# Patient Record
Sex: Male | Born: 1940
Health system: Southern US, Community
[De-identification: ages and names within clinical notes are randomized; demographics above are authoritative.]

## PROBLEM LIST (undated history)

## (undated) DIAGNOSIS — M48061 Spinal stenosis, lumbar region without neurogenic claudication: Secondary | ICD-10-CM

## (undated) DIAGNOSIS — C801 Malignant (primary) neoplasm, unspecified: Secondary | ICD-10-CM

## (undated) DIAGNOSIS — I10 Essential (primary) hypertension: Secondary | ICD-10-CM

## (undated) DIAGNOSIS — IMO0002 Reserved for concepts with insufficient information to code with codable children: Secondary | ICD-10-CM

## (undated) DIAGNOSIS — E7800 Pure hypercholesterolemia, unspecified: Secondary | ICD-10-CM

## (undated) DIAGNOSIS — D696 Thrombocytopenia, unspecified: Secondary | ICD-10-CM

## (undated) DIAGNOSIS — M1990 Unspecified osteoarthritis, unspecified site: Secondary | ICD-10-CM

## (undated) DIAGNOSIS — D689 Coagulation defect, unspecified: Secondary | ICD-10-CM

## (undated) DIAGNOSIS — H269 Unspecified cataract: Secondary | ICD-10-CM

## (undated) DIAGNOSIS — J00 Acute nasopharyngitis [common cold]: Secondary | ICD-10-CM

## (undated) DIAGNOSIS — J309 Allergic rhinitis, unspecified: Secondary | ICD-10-CM

## (undated) DIAGNOSIS — N400 Enlarged prostate without lower urinary tract symptoms: Secondary | ICD-10-CM

## (undated) DIAGNOSIS — I959 Hypotension, unspecified: Secondary | ICD-10-CM

## (undated) DIAGNOSIS — T7840XA Allergy, unspecified, initial encounter: Secondary | ICD-10-CM

## (undated) DIAGNOSIS — I2510 Atherosclerotic heart disease of native coronary artery without angina pectoris: Secondary | ICD-10-CM

## (undated) HISTORY — DX: Unspecified cataract: H26.9

## (undated) HISTORY — DX: Allergy, unspecified, initial encounter: T78.40XA

## (undated) HISTORY — DX: Malignant (primary) neoplasm, unspecified: C80.1

## (undated) HISTORY — DX: Acute nasopharyngitis (common cold): J00

## (undated) HISTORY — PX: COLONOSCOPY: SHX174

## (undated) HISTORY — DX: Unspecified osteoarthritis, unspecified site: M19.90

## (undated) HISTORY — DX: Thrombocytopenia, unspecified: D69.6

## (undated) HISTORY — DX: Essential (primary) hypertension: I10

## (undated) HISTORY — DX: Atherosclerotic heart disease of native coronary artery without angina pectoris: I25.10

## (undated) HISTORY — DX: Benign prostatic hyperplasia without lower urinary tract symptoms: N40.0

## (undated) HISTORY — PX: CHOLECYSTECTOMY: SHX55

## (undated) HISTORY — PX: EYE SURGERY: SHX253

## (undated) HISTORY — DX: Allergic rhinitis, unspecified: J30.9

## (undated) HISTORY — DX: Coagulation defect, unspecified: D68.9

## (undated) HISTORY — PX: OTHER SURGICAL HISTORY: SHX169

## (undated) HISTORY — DX: Spinal stenosis, lumbar region without neurogenic claudication: M48.061

## (undated) HISTORY — DX: Reserved for concepts with insufficient information to code with codable children: IMO0002

## (undated) HISTORY — DX: Hypotension, unspecified: I95.9

## (undated) HISTORY — DX: Pure hypercholesterolemia, unspecified: E78.00

---
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
## 1963-09-27 HISTORY — PX: HERNIA REPAIR: SHX51

## 2010-04-19 ENCOUNTER — Encounter: Admission: RE | Admit: 2010-04-19 | Discharge: 2010-04-19 | Payer: Self-pay | Admitting: Neurology

## 2010-06-02 ENCOUNTER — Encounter: Admission: RE | Admit: 2010-06-02 | Discharge: 2010-06-02 | Payer: Self-pay | Admitting: Neurology

## 2010-12-01 ENCOUNTER — Encounter: Payer: Self-pay | Admitting: Cardiovascular Disease

## 2010-12-13 ENCOUNTER — Encounter: Payer: Self-pay | Admitting: Cardiovascular Disease

## 2010-12-13 ENCOUNTER — Other Ambulatory Visit: Payer: Self-pay | Admitting: Neurology

## 2010-12-13 DIAGNOSIS — M4800 Spinal stenosis, site unspecified: Secondary | ICD-10-CM

## 2010-12-15 ENCOUNTER — Ambulatory Visit
Admission: RE | Admit: 2010-12-15 | Discharge: 2010-12-15 | Disposition: A | Discharge status: Home / Self Care | Payer: Medicare Other | Source: Ambulatory Visit | Attending: Neurology | Admitting: Neurology

## 2010-12-15 DIAGNOSIS — M4800 Spinal stenosis, site unspecified: Secondary | ICD-10-CM

## 2011-04-15 ENCOUNTER — Encounter: Payer: Self-pay | Admitting: Cardiovascular Disease

## 2011-05-05 ENCOUNTER — Encounter: Payer: Self-pay | Admitting: Cardiovascular Disease

## 2011-05-05 ENCOUNTER — Ambulatory Visit (INDEPENDENT_AMBULATORY_CARE_PROVIDER_SITE_OTHER): Payer: Medicare Other | Admitting: Cardiovascular Disease

## 2011-05-05 DIAGNOSIS — R079 Chest pain, unspecified: Secondary | ICD-10-CM

## 2011-05-05 DIAGNOSIS — R9431 Abnormal electrocardiogram [ECG] [EKG]: Secondary | ICD-10-CM

## 2011-05-05 NOTE — Patient Instructions (Addendum)
Your physician recommends that you schedule a follow-up appointment as needed with Dr. Excell Seltzer.   Your physician has requested that you have en exercise stress myoview. For further information please visit [email or web address] Please follow instruction sheet, as given.

## 2011-05-07 ENCOUNTER — Encounter: Payer: Self-pay | Admitting: Cardiovascular Disease

## 2011-05-07 DIAGNOSIS — R9431 Abnormal electrocardiogram [ECG] [EKG]: Secondary | ICD-10-CM | POA: Insufficient documentation

## 2011-05-07 NOTE — Assessment & Plan Note (Signed)
This is a 70 year old gentleman with an abnormal EKG, changes worrisome for ischemia. With his EKG findings, especially considering his plans for upcoming lumbar back surgery, I think we should proceed with a Lexiscan Myoview stress test. This will help evaluate the patient's EKG abnormality and also will risk stratify him for surgery. He does not have a particularly strong risk profile for cardiac disease although he clearly is at risk at age 19. I have reviewed his cholesterol panel demonstrated an HDL of 45 LDL 128 and triglycerides of 86 I think if his stress test is normal and considering his paucity of other risk factors, it would be reasonable to continue with lifestyle modification. Will followup with the patient after his stress test results are complete, and if there is no ischemia I would be happy to see him back on an as needed basis.

## 2011-05-07 NOTE — Progress Notes (Signed)
HPI:  This is a 70 year old gentleman referred for evaluation of an abnormal EKG. The patient developed neck and ear pain in the setting of a fever. Because of his neck pain, an EKG was done in the emergency department. This demonstrated new changes compared to his baseline EKG. He was noted to have inferolateral ST segment abnormality worrisome for ischemia. The patient had no symptoms of chest pain or pressure. He has no past history of cardiac disease. He was referred here for further evaluation. The patient has upcoming back surgery scheduled.  He specifically denies chest pain or pressure. He denies dyspnea, edema, orthopnea, palpitations, or PND. He does have shortness of breath with exertion and he has previously felt this was age related.  Outpatient Encounter Prescriptions as of 05/05/2011  Medication Sig Dispense Refill  . aspirin 325 MG tablet Take 325 mg by mouth daily.        . calcium carbonate (OS-CAL) 600 MG TABS Take 600 mg by mouth daily.        Marland Kitchen doxazosin (CARDURA) 2 MG tablet Take 2 mg by mouth at bedtime.        . gabapentin (NEURONTIN) 300 MG capsule Take 300 mg by mouth daily.        Marland Kitchen gabapentin (NEURONTIN) 600 MG tablet Take 600 mg by mouth daily.        . Multiple Vitamin (MULTIVITAMIN) capsule Take 1 capsule by mouth daily.        . tadalafil (CIALIS) 20 MG tablet Take 20 mg by mouth daily as needed.        . vitamin B-12 (CYANOCOBALAMIN) 1000 MCG tablet Take 1,000 mcg by mouth daily.        Marland Kitchen VITAMIN D, CHOLECALCIFEROL, PO Take by mouth daily.          Review of patient's allergies indicates no known allergies.  No past medical history on file.  Past Surgical History  Procedure Date  . Hernia repair 1965  . Appendectomy 1958    History   Social History  . Marital Status: Married    Spouse Name: N/A    Number of Children: N/A  . Years of Education: N/A   Occupational History  . Not on file.   Social History Main Topics  . Smoking status: Former Games developer    . Smokeless tobacco: Not on file  . Alcohol Use: Not on file  . Drug Use: Not on file  . Sexually Active: Not on file   Other Topics Concern  . Not on file   Social History Narrative   The patient is widowed. He is retired from Airline pilot. He quit smoking in 1995. He has no history of alcohol use. He has previously done regular exercise until developing a back problem.    Family History  Problem Relation Age of Onset  . Stroke Mother 1  . Prostate cancer Father 57  . Heart failure Brother 65    ROS: General: no fevers/chills/night sweats Eyes: no blurry vision, diplopia, or amaurosis ENT: no sore throat or hearing loss Resp: no cough, wheezing, or hemoptysis CV: no edema or palpitations GI: no abdominal pain, nausea, vomiting, diarrhea, or constipation GU: no dysuria, frequency, or hematuria Skin: no rash Neuro: no headache, numbness, tingling, or weakness of extremities Musculoskeletal: no joint pain or swelling Heme: no bleeding, DVT, or easy bruising Endo: no polydipsia or polyuria  BP 140/78  Pulse 60  Resp 16  Ht 6' (1.829 m)  Wt 200 lb (90.719  kg)  BMI 27.12 kg/m2  PHYSICAL EXAM: Pt is alert and oriented, WD, WN, in no distress. HEENT: normal Neck: JVP normal. Carotid upstrokes normal without bruits. No thyromegaly. Lungs: equal expansion, clear bilaterally CV: Apex is discrete and nondisplaced, RRR without murmur or gallop Abd: soft, NT, +BS, no bruit, no hepatosplenomegaly Back: no CVA tenderness Ext: no C/C/E        Femoral pulses 2+= without bruits        DP/PT pulses intact and = Skin: warm and dry without rash Neuro: CNII-XII intact             Strength intact = bilaterally  EKG:  Normal sinus rhythm with nonspecific IVCD. ST/T abnormalities consider inferolateral ischemia. EKG dated 7 22,012.  ASSESSMENT AND PLAN:

## 2011-05-17 ENCOUNTER — Ambulatory Visit (HOSPITAL_COMMUNITY): Payer: Medicare Other | Attending: Cardiovascular Disease | Admitting: Radiology

## 2011-05-17 DIAGNOSIS — R0609 Other forms of dyspnea: Secondary | ICD-10-CM

## 2011-05-17 DIAGNOSIS — R079 Chest pain, unspecified: Secondary | ICD-10-CM | POA: Insufficient documentation

## 2011-05-17 DIAGNOSIS — R9431 Abnormal electrocardiogram [ECG] [EKG]: Secondary | ICD-10-CM | POA: Insufficient documentation

## 2011-05-17 MED ORDER — TECHNETIUM TC 99M TETROFOSMIN IV KIT
11.0000 | PACK | Freq: Once | INTRAVENOUS | Status: AC | PRN
  Administered 2011-05-17: 11 via INTRAVENOUS

## 2011-05-17 MED ORDER — TECHNETIUM TC 99M TETROFOSMIN IV KIT
33.0000 | PACK | Freq: Once | INTRAVENOUS | Status: AC | PRN
  Administered 2011-05-17: 33 via INTRAVENOUS

## 2011-05-17 NOTE — Progress Notes (Signed)
[facility] SITE 3 NUCLEAR MED [address] [place] [address] [phone]  Cardiology Nuclear Med [place]  Larry Harris is a 70 y.o. male [id number] [birth year]   Nuclear Med Background Indication for Stress Test:  Evaluation for Ischemia; Abnormal EKG and Pending Back Surgery by Dr. Raynald Kemp @ [facility] History:  '97 UYQ:[id number]>DGLO:VF per patient Cardiac Risk Factors: History of Smoking  Symptoms:  Neck Pain with 102* fever (last episode of neck discomfort was in July) and DOE   Nuclear Pre-Procedure Caffeine/Decaff Intake:  None NPO After: 7:30pm   Lungs:  Clear. IV 0.9% NS with Angio Cath:  20g  IV Site: R Antecubital  IV Started by:  Stanton Kidney, EMT-P  Chest Size (in):  42 Cup Size: n/a  Height: 6' (1.829 m)  Weight:  196 lb (88.905 kg)  BMI:  Body mass index is 26.58 kg/(m^2). Tech Comments: A.M. Medications taken.    Nuclear Med Study 1 or 2 day study: 1 day  Stress Test Type:  Stress  Reading MD: Willa Rough, MD  Order Authorizing Provider:  Tonny Bollman, MD  Resting Radionuclide: Technetium 28m Tetrofosmin  Resting Radionuclide Dose: 11.0 mCi   Stress Radionuclide:  Technetium 72m Tetrofosmin  Stress Radionuclide Dose: 33.0 mCi           Stress Protocol Rest HR: 63 Stress HR: 140  Rest BP: 125/77 Stress BP: 192/65  Exercise Time (min): 8:01 METS: 10.1   Predicted Max HR: 149 bpm % Max HR: 93.96 bpm Rate Pressure Product: 64332   Dose of Adenosine (mg):  n/a Dose of Lexiscan: n/a mg  Dose of Atropine (mg): n/a Dose of Dobutamine: n/a mcg/kg/min (at max HR)  Stress Test Technologist: Smiley Houseman, CMA-N  Nuclear Technologist:  Doyne Keel, CNMT     Rest Procedure:  Myocardial perfusion imaging was performed at rest 45 minutes following the intravenous administration of Technetium 50m Tetrofosmin.  Rest ECG: Nonspecific T-wave changes and occasional PVC's.  Stress Procedure:  The patient exercised for 8:01 on the treadmill  utilizing the Bruce protocol.  The patient stopped due to fatigue and denied any chest pain.  There were marked ST-T wave changes.  Technetium 63m Tetrofosmin was injected at peak exercise and myocardial perfusion imaging was performed after a brief delay.  Stress ECG: The resting ST segments are abnormal. With stress there is significant additional ST depression.  QPS Raw Data Images:  Patient motion noted; appropriate software correction applied. Stress Images:  Normal homogeneous uptake in all areas of the myocardium. Rest Images:  Normal homogeneous uptake in all areas of the myocardium. Subtraction (SDS):  No evidence of ischemia. Transient Ischemic Dilatation (Normal <1.22):  1.15 Lung/Heart Ratio (Normal <0.45):  0.24  Quantitative Gated Spect Images QGS EDV:  100 ml QGS ESV:  44 ml QGS cine images:  Normal Wall Motion QGS EF: 57%  Impression Exercise Capacity:  Good exercise capacity. BP Response:  Normal blood pressure response. Clinical Symptoms:  No chest pain. ECG Impression:  As noted above, there are resting ST changes that become worse with stress. Comparison with Prior Nuclear Study: No images to compare  Overall Impression:  The nuclear images are normal. There is no dilitation of the LV with stress. The EKG changes are concerning, but there are significant ST changes at rest.  The nuclear images are normal.  Willa Rough

## 2012-01-03 ENCOUNTER — Other Ambulatory Visit: Payer: Self-pay | Admitting: Neurology

## 2012-01-03 DIAGNOSIS — M48061 Spinal stenosis, lumbar region without neurogenic claudication: Secondary | ICD-10-CM

## 2012-01-03 DIAGNOSIS — M7918 Myalgia, other site: Secondary | ICD-10-CM

## 2012-01-09 ENCOUNTER — Ambulatory Visit
Admission: RE | Admit: 2012-01-09 | Discharge: 2012-01-09 | Disposition: A | Discharge status: Home / Self Care | Payer: Medicare Other | Source: Ambulatory Visit | Attending: Neurology | Admitting: Neurology

## 2012-01-09 VITALS — BP 156/69 | HR 64

## 2012-01-09 DIAGNOSIS — M48061 Spinal stenosis, lumbar region without neurogenic claudication: Secondary | ICD-10-CM

## 2012-01-09 DIAGNOSIS — M7918 Myalgia, other site: Secondary | ICD-10-CM

## 2012-01-09 MED ORDER — METHYLPREDNISOLONE ACETATE 40 MG/ML INJ SUSP (RADIOLOG
120.0000 mg | Freq: Once | INTRAMUSCULAR | Status: AC
  Administered 2012-01-09: 120 mg via EPIDURAL

## 2012-01-09 MED ORDER — IOHEXOL 180 MG/ML  SOLN
1.0000 mL | Freq: Once | INTRAMUSCULAR | Status: AC | PRN
  Administered 2012-01-09: 1 mL via EPIDURAL

## 2012-01-09 NOTE — Discharge Instructions (Signed)

## 2012-05-24 ENCOUNTER — Other Ambulatory Visit: Payer: Self-pay | Admitting: Neurology

## 2012-05-24 DIAGNOSIS — M5416 Radiculopathy, lumbar region: Secondary | ICD-10-CM

## 2012-05-24 DIAGNOSIS — M4807 Spinal stenosis, lumbosacral region: Secondary | ICD-10-CM

## 2012-05-30 ENCOUNTER — Ambulatory Visit
Admission: RE | Admit: 2012-05-30 | Discharge: 2012-05-30 | Disposition: A | Discharge status: Home / Self Care | Payer: Medicare Other | Source: Ambulatory Visit | Attending: Neurology | Admitting: Neurology

## 2012-05-30 DIAGNOSIS — M4807 Spinal stenosis, lumbosacral region: Secondary | ICD-10-CM

## 2012-05-30 DIAGNOSIS — M5416 Radiculopathy, lumbar region: Secondary | ICD-10-CM

## 2012-06-01 ENCOUNTER — Other Ambulatory Visit: Payer: Self-pay | Admitting: Neurology

## 2012-06-01 DIAGNOSIS — M5416 Radiculopathy, lumbar region: Secondary | ICD-10-CM

## 2012-06-01 DIAGNOSIS — M48061 Spinal stenosis, lumbar region without neurogenic claudication: Secondary | ICD-10-CM

## 2012-06-07 ENCOUNTER — Ambulatory Visit
Admission: RE | Admit: 2012-06-07 | Discharge: 2012-06-07 | Disposition: A | Discharge status: Home / Self Care | Payer: Medicare Other | Source: Ambulatory Visit | Attending: Neurology | Admitting: Neurology

## 2012-06-07 DIAGNOSIS — M5416 Radiculopathy, lumbar region: Secondary | ICD-10-CM

## 2012-06-07 DIAGNOSIS — M48061 Spinal stenosis, lumbar region without neurogenic claudication: Secondary | ICD-10-CM

## 2012-06-07 MED ORDER — IOHEXOL 180 MG/ML  SOLN
1.0000 mL | Freq: Once | INTRAMUSCULAR | Status: AC | PRN
  Administered 2012-06-07: 1 mL via EPIDURAL

## 2012-06-07 MED ORDER — METHYLPREDNISOLONE ACETATE 40 MG/ML INJ SUSP (RADIOLOG
120.0000 mg | Freq: Once | INTRAMUSCULAR | Status: AC
  Administered 2012-06-07: 120 mg via EPIDURAL

## 2012-11-28 ENCOUNTER — Other Ambulatory Visit: Payer: Self-pay | Admitting: Neurology

## 2012-11-28 DIAGNOSIS — M5416 Radiculopathy, lumbar region: Secondary | ICD-10-CM

## 2013-01-02 ENCOUNTER — Ambulatory Visit
Admission: RE | Admit: 2013-01-02 | Discharge: 2013-01-02 | Disposition: A | Discharge status: Home / Self Care | Payer: Medicare Other | Source: Ambulatory Visit | Attending: Neurology | Admitting: Neurology

## 2013-01-02 DIAGNOSIS — M5416 Radiculopathy, lumbar region: Secondary | ICD-10-CM

## 2013-01-02 MED ORDER — IOHEXOL 180 MG/ML  SOLN
1.0000 mL | Freq: Once | INTRAMUSCULAR | Status: AC | PRN
  Administered 2013-01-02: 1 mL via EPIDURAL

## 2013-01-02 MED ORDER — METHYLPREDNISOLONE ACETATE 40 MG/ML INJ SUSP (RADIOLOG
120.0000 mg | Freq: Once | INTRAMUSCULAR | Status: AC
  Administered 2013-01-02: 120 mg via EPIDURAL

## 2014-06-03 ENCOUNTER — Encounter: Payer: Self-pay | Admitting: Neurology

## 2014-06-04 ENCOUNTER — Ambulatory Visit (INDEPENDENT_AMBULATORY_CARE_PROVIDER_SITE_OTHER): Payer: Medicare Other | Admitting: Neurology

## 2014-06-04 ENCOUNTER — Encounter: Payer: Self-pay | Admitting: Neurology

## 2014-06-04 ENCOUNTER — Encounter (INDEPENDENT_AMBULATORY_CARE_PROVIDER_SITE_OTHER): Payer: Self-pay

## 2014-06-04 VITALS — BP 173/77 | HR 72 | Ht 71.0 in | Wt 200.0 lb

## 2014-06-04 DIAGNOSIS — M48061 Spinal stenosis, lumbar region without neurogenic claudication: Secondary | ICD-10-CM | POA: Insufficient documentation

## 2014-06-04 HISTORY — DX: Spinal stenosis, lumbar region without neurogenic claudication: M48.061

## 2014-06-04 NOTE — Progress Notes (Signed)
Reason for visit: Lumbosacral spinal stenosis  Larry Harris is a 73 y.o. male  History of present illness:  Larry Harris is a 73 year old right-handed white male with a history of lumbosacral spinal stenosis with severe spinal stenosis at the L4-5 level. The patient has been followed by Dr. Erling Cruz from this group since 2011. The last MRI study on the low back was done in 2013. In the past, the patient has not responded to physical therapy, but he has done well with epidural steroid injections. The injections have lasted many months, and the last injection he had was in April of 2014. The patient has done quite well since that time. The patient reports some back discomfort, and sciatica pain down the right leg to the foot with numbness in the right foot. Occasionally, he will have some pain down the left leg. The patient denies any neck pain or arm discomfort. He denies issues controlling the bowels or the bladder. He denies any balance problems. He finds it difficult to walk uphill. He is sent to this office for further evaluation.  Past Medical History  Diagnosis Date  . CAD (coronary artery disease)   . Acute rhinitis   . Hypotension   . Hypercholesteremia   . Osteoarthritis   . BPH (benign prostatic hyperplasia)   . Allergic rhinitis, cause unspecified   . Thrombocytopenia, unspecified   . Neuralgia, neuritis, and radiculitis, unspecified   . Spinal stenosis of lumbar region 06/04/2014    Past Surgical History  Procedure Laterality Date  . Hernia repair  1965  . Appendectomy  1958    Family History  Problem Relation Age of Onset  . Stroke Mother 70  . Prostate cancer Father 65  . Heart failure Brother 62    Social history:  reports that he has quit smoking. His smoking use included Cigarettes. He has a 25 pack-year smoking history. He has never used smokeless tobacco. He reports that he drinks alcohol. He reports that he does not use illicit drugs.  Medications:    Current Outpatient Prescriptions on File Prior to Visit  Medication Sig Dispense Refill  . aspirin 325 MG tablet Take 325 mg by mouth daily.        . calcium carbonate (OS-CAL) 600 MG TABS Take 600 mg by mouth daily.        Marland Kitchen doxazosin (CARDURA) 2 MG tablet Take 2 mg by mouth at bedtime.        . fexofenadine (ALLEGRA) 180 MG tablet Take 180 mg by mouth daily.      Marland Kitchen gabapentin (NEURONTIN) 300 MG capsule Take 300 mg by mouth daily.        Marland Kitchen gabapentin (NEURONTIN) 600 MG tablet Take 600 mg by mouth daily.        . Multiple Vitamin (MULTIVITAMIN) capsule Take 1 capsule by mouth daily.        . sildenafil (VIAGRA) 100 MG tablet Take 100 mg by mouth daily as needed for erectile dysfunction.      . vitamin B-12 (CYANOCOBALAMIN) 1000 MCG tablet Take 1,000 mcg by mouth daily.        Marland Kitchen VITAMIN D, CHOLECALCIFEROL, PO Take by mouth daily.         No current facility-administered medications on file prior to visit.     No Known Allergies  ROS:  Out of a complete 14 system review of symptoms, the patient complains only of the following symptoms, and all other reviewed systems are negative.  Snoring Urination problems Easy bruising Muscle cramps Numbness, weakness  Blood pressure 173/77, pulse 72, height 5\' 11"  (1.803 m), weight 200 lb (90.719 kg).  Physical Exam  General: The patient is alert and cooperative at the time of the examination.  Eyes: Pupils are equal, round, and reactive to light. Discs are flat bilaterally.  Neck: The neck is supple, no carotid bruits are noted.  Respiratory: The respiratory examination is clear.  Cardiovascular: The cardiovascular examination reveals a regular rate and rhythm, no obvious murmurs or rubs are noted.  Neuromuscular: Range of movement of the low back is full.  Skin: Extremities are without significant edema.  Neurologic Exam  Mental status: The patient is alert and oriented x 3 at the time of the examination. The patient has apparent  normal recent and remote memory, with an apparently normal attention span and concentration ability.  Cranial nerves: Facial symmetry is present. There is good sensation of the face to pinprick and soft touch bilaterally. The strength of the facial muscles and the muscles to head turning and shoulder shrug are normal bilaterally. Speech is well enunciated, no aphasia or dysarthria is noted. Extraocular movements are full. Visual fields are full. The tongue is midline, and the patient has symmetric elevation of the soft palate. No obvious hearing deficits are noted.  Motor: The motor testing reveals 5 over 5 strength of all 4 extremities. Good symmetric motor tone is noted throughout.  Sensory: Sensory testing is intact to pinprick, soft touch, vibration sensation, and position sense on all 4 extremities, with the exception that there is some decrease in pinprick sensation and vibration sensation on the right foot. No evidence of extinction is noted.  Coordination: Cerebellar testing reveals good finger-nose-finger and heel-to-shin bilaterally.  Gait and station: Gait is normal. Tandem gait is slightly unsteady. The patient is unable to walk on the toes bilaterally, is able to walk on heels bilaterally. Romberg is negative. No drift is seen.  Reflexes: Deep tendon reflexes are symmetric, but are depressed bilaterally. The ankle jerk reflexes are absent bilaterally. Toes are downgoing bilaterally.   MRI lumbar 05/29/12:  Impression: Abnormal MRI lumbar spine (without contrast) demonstrating: 1. At L4-5: Grade 1 anterior spondylolisthesis of L4 on L5 (15mm), disc bulging and facet hypertrophy with severe spinal stenosis.  2. Spondylosis and disc bulging from T12-L1 to L5-S1.    Assessment/Plan:  1. Lumbosacral spinal stenosis, L4-5 level  The patient is having ongoing discomfort in the back, and he will be referred for another epidural steroid injection. He will followup in one year, but he will  contact our office if problems arise.  Jill Alexanders MD 06/04/2014 4:43 PM  [facility] [address] [place], [place] [address]  Phone [phone] Fax [phone]

## 2014-06-04 NOTE — Patient Instructions (Signed)
Spinal Stenosis Spinal stenosis is an abnormal narrowing of the canals of your spine (vertebrae). CAUSES  Spinal stenosis is caused by areas of bone pushing into the central canals of your vertebrae. This condition can be present at birth (congenital). It also may be caused by arthritic deterioration of your vertebrae (spinal degeneration).  SYMPTOMS   Pain that is generally worse with activities, particularly standing and walking.  Numbness, tingling, hot or cold sensations, weakness, or weariness in your legs.  Frequent episodes of falling.  A foot-slapping gait that leads to muscle weakness. DIAGNOSIS  Spinal stenosis is diagnosed with the use of magnetic resonance imaging (MRI) or computed tomography (CT). TREATMENT  Initial therapy for spinal stenosis focuses on the management of the pain and other symptoms associated with the condition. These therapies include:  Practicing postural changes to lessen pressure on your nerves.  Exercises to strengthen the core of your body.  Loss of excess body weight.  The use of nonsteroidal anti-inflammatory medicines to reduce swelling and inflammation in your nerves. When therapies to manage pain are not successful, surgery to treat spinal stenosis may be recommended. This surgery involves removing excess bone, which puts pressure on your nerve roots. During this surgery (laminectomy), the posterior boney arch (lamina) and excess bone around the facet joints are removed. Document Released: 12/03/2003 Document Revised: 01/27/2014 Document Reviewed: 12/21/2012 Exit[employer] Patient Information 2015 [employer]. This information is not intended to replace advice given to you by your health care provider. Make sure you discuss any questions you have with your health care provider.  

## 2014-06-05 ENCOUNTER — Other Ambulatory Visit: Payer: Self-pay | Admitting: Neurology

## 2014-06-05 DIAGNOSIS — M48061 Spinal stenosis, lumbar region without neurogenic claudication: Secondary | ICD-10-CM

## 2014-07-25 ENCOUNTER — Ambulatory Visit
Admission: RE | Admit: 2014-07-25 | Discharge: 2014-07-25 | Disposition: A | Discharge status: Home / Self Care | Payer: Medicare Other | Source: Ambulatory Visit | Attending: Neurology | Admitting: Neurology

## 2014-07-25 DIAGNOSIS — M48061 Spinal stenosis, lumbar region without neurogenic claudication: Secondary | ICD-10-CM

## 2014-07-25 MED ORDER — METHYLPREDNISOLONE ACETATE 40 MG/ML INJ SUSP (RADIOLOG
120.0000 mg | Freq: Once | INTRAMUSCULAR | Status: AC
  Administered 2014-07-25: 120 mg via EPIDURAL

## 2014-07-25 MED ORDER — IOHEXOL 180 MG/ML  SOLN
1.0000 mL | Freq: Once | INTRAMUSCULAR | Status: AC | PRN
  Administered 2014-07-25: 1 mL via EPIDURAL

## 2014-07-25 NOTE — Discharge Instructions (Signed)

## 2014-08-13 ENCOUNTER — Encounter: Payer: Self-pay | Admitting: Neurology

## 2014-08-19 ENCOUNTER — Encounter: Payer: Self-pay | Admitting: Neurology

## 2015-01-13 DIAGNOSIS — L570 Actinic keratosis: Secondary | ICD-10-CM | POA: Diagnosis not present

## 2015-01-13 DIAGNOSIS — L821 Other seborrheic keratosis: Secondary | ICD-10-CM | POA: Diagnosis not present

## 2015-01-13 DIAGNOSIS — D1801 Hemangioma of skin and subcutaneous tissue: Secondary | ICD-10-CM | POA: Diagnosis not present

## 2015-01-13 DIAGNOSIS — Z08 Encounter for follow-up examination after completed treatment for malignant neoplasm: Secondary | ICD-10-CM | POA: Diagnosis not present

## 2015-01-13 DIAGNOSIS — Z85828 Personal history of other malignant neoplasm of skin: Secondary | ICD-10-CM | POA: Diagnosis not present

## 2015-01-13 DIAGNOSIS — L578 Other skin changes due to chronic exposure to nonionizing radiation: Secondary | ICD-10-CM | POA: Diagnosis not present

## 2015-01-13 DIAGNOSIS — L718 Other rosacea: Secondary | ICD-10-CM | POA: Diagnosis not present

## 2015-01-14 DIAGNOSIS — I2510 Atherosclerotic heart disease of native coronary artery without angina pectoris: Secondary | ICD-10-CM | POA: Diagnosis not present

## 2015-01-21 DIAGNOSIS — I2510 Atherosclerotic heart disease of native coronary artery without angina pectoris: Secondary | ICD-10-CM | POA: Diagnosis not present

## 2015-01-21 DIAGNOSIS — Z0000 Encounter for general adult medical examination without abnormal findings: Secondary | ICD-10-CM | POA: Diagnosis not present

## 2015-01-23 DIAGNOSIS — Z136 Encounter for screening for cardiovascular disorders: Secondary | ICD-10-CM | POA: Diagnosis not present

## 2015-06-08 ENCOUNTER — Ambulatory Visit (AMBULATORY_SURGERY_CENTER): Payer: Self-pay

## 2015-06-08 VITALS — Ht 71.5 in | Wt 200.6 lb

## 2015-06-08 DIAGNOSIS — Z860100 Personal history of colon polyps, unspecified: Secondary | ICD-10-CM

## 2015-06-08 NOTE — Progress Notes (Signed)
No allergies to eggs or soy No diet/weight loss meds No home oxygen No past problems with anesthesia  Refused emmi 

## 2015-06-10 ENCOUNTER — Ambulatory Visit (INDEPENDENT_AMBULATORY_CARE_PROVIDER_SITE_OTHER): Payer: Medicare Other | Admitting: Neurology

## 2015-06-10 ENCOUNTER — Encounter: Payer: Self-pay | Admitting: Neurology

## 2015-06-10 VITALS — BP 154/83 | HR 65 | Ht 71.0 in | Wt 199.5 lb

## 2015-06-10 DIAGNOSIS — M48061 Spinal stenosis, lumbar region without neurogenic claudication: Secondary | ICD-10-CM

## 2015-06-10 DIAGNOSIS — M4806 Spinal stenosis, lumbar region: Secondary | ICD-10-CM

## 2015-06-10 NOTE — Patient Instructions (Signed)
Spinal Stenosis Spinal stenosis is an abnormal narrowing of the canals of your spine (vertebrae). CAUSES  Spinal stenosis is caused by areas of bone pushing into the central canals of your vertebrae. This condition can be present at birth (congenital). It also may be caused by arthritic deterioration of your vertebrae (spinal degeneration).  SYMPTOMS   Pain that is generally worse with activities, particularly standing and walking.  Numbness, tingling, hot or cold sensations, weakness, or weariness in your legs.  Frequent episodes of falling.  A foot-slapping gait that leads to muscle weakness. DIAGNOSIS  Spinal stenosis is diagnosed with the use of magnetic resonance imaging (MRI) or computed tomography (CT). TREATMENT  Initial therapy for spinal stenosis focuses on the management of the pain and other symptoms associated with the condition. These therapies include:  Practicing postural changes to lessen pressure on your nerves.  Exercises to strengthen the core of your body.  Loss of excess body weight.  The use of nonsteroidal anti-inflammatory medicines to reduce swelling and inflammation in your nerves. When therapies to manage pain are not successful, surgery to treat spinal stenosis may be recommended. This surgery involves removing excess bone, which puts pressure on your nerve roots. During this surgery (laminectomy), the posterior boney arch (lamina) and excess bone around the facet joints are removed. Document Released: 12/03/2003 Document Revised: 01/27/2014 Document Reviewed: 12/21/2012 Exit[employer] Patient Information 2015 [employer]. This information is not intended to replace advice given to you by your health care provider. Make sure you discuss any questions you have with your health care provider.  

## 2015-06-10 NOTE — Progress Notes (Signed)
Reason for visit: Spinal stenosis  Larry Harris is an 74 y.o. male  History of present illness:  Larry Harris is a 74 year old right-handed white male with a history of lumbar spinal stenosis at the L4-5 level. The patient has done very well since last seen. He got an epidural steroid injection one year ago, he indicates that he may get 4-6 months of benefit from the injection. He has moved to a town home, and he is not doing as much yard work, and his back has done much better. He has been walking some, and he indicates that he cannot run. He otherwise has not noted any other new medical issues that have come up since last seen. Currently, he is not in a lot of discomfort with the back. At times, he may have some discomfort going down into the right leg into the gastrocnemius muscle primarily.  Past Medical History  Diagnosis Date  . CAD (coronary artery disease)   . Acute rhinitis   . Hypotension   . Hypercholesteremia   . Osteoarthritis   . BPH (benign prostatic hyperplasia)   . Allergic rhinitis, cause unspecified   . Thrombocytopenia, unspecified   . Neuralgia, neuritis, and radiculitis, unspecified   . Spinal stenosis of lumbar region 06/04/2014    Past Surgical History  Procedure Laterality Date  . Hernia repair  1965    x2  . Appendectomy  1958    Family History  Problem Relation Age of Onset  . Stroke Mother 28  . Prostate cancer Father 72  . Heart failure Brother 77  . Colon cancer Neg Hx     Social history:  reports that he has quit smoking. His smoking use included Cigarettes. He has a 25 pack-year smoking history. He has never used smokeless tobacco. He reports that he drinks about 0.6 oz of alcohol per week. He reports that he does not use illicit drugs.   No Known Allergies  Medications:  Prior to Admission medications   Medication Sig Start Date End Date Taking? Authorizing Provider  aspirin 325 MG tablet Take 325 mg by mouth daily.     Yes  Historical Provider, MD  calcium carbonate (OS-CAL) 600 MG TABS Take 600 mg by mouth daily.     Yes Historical Provider, MD  diphenhydrAMINE (BENADRYL) 25 mg capsule Take 25 mg by mouth as needed (insomnia).   Yes Historical Provider, MD  doxazosin (CARDURA) 2 MG tablet Take 2 mg by mouth at bedtime.     Yes Historical Provider, MD  gabapentin (NEURONTIN) 300 MG capsule Take 300 mg by mouth daily.     Yes Historical Provider, MD  gabapentin (NEURONTIN) 600 MG tablet Take 600 mg by mouth daily.     Yes Historical Provider, MD  Multiple Vitamin (MULTIVITAMIN) capsule Take 1 capsule by mouth daily.     Yes Historical Provider, MD  naproxen sodium (ANAPROX) 220 MG tablet Take 220 mg by mouth 2 (two) times daily with a meal. Sciatic nerve pain   Yes Historical Provider, MD  sildenafil (VIAGRA) 100 MG tablet Take 100 mg by mouth daily as needed for erectile dysfunction.   Yes Historical Provider, MD  vitamin B-12 (CYANOCOBALAMIN) 1000 MCG tablet Take 1,000 mcg by mouth daily.     Yes Historical Provider, MD  VITAMIN D, CHOLECALCIFEROL, PO Take 1,000 Units by mouth daily.    Yes Historical Provider, MD    ROS:  Out of a complete 14 system review of symptoms, the  patient complains only of the following symptoms, and all other reviewed systems are negative.  Frequency of urination Muscle cramps  Blood pressure 154/83, pulse 65, height 5\' 11"  (1.803 m), weight 199 lb 8 oz (90.493 kg).  Physical Exam  General: The patient is alert and cooperative at the time of the examination.  Neuromuscular: Range of movement of the low back is full.  Skin: No significant peripheral edema is noted.   Neurologic Exam  Mental status: The patient is alert and oriented x 3 at the time of the examination. The patient has apparent normal recent and remote memory, with an apparently normal attention span and concentration ability.   Cranial nerves: Facial symmetry is present. Speech is normal, no aphasia or  dysarthria is noted. Extraocular movements are full. Visual fields are full.  Motor: The patient has good strength in all 4 extremities. The patient is unable to walk on the toes bilaterally, is able to walk on the heels bilaterally.  Sensory examination: Soft touch sensation is symmetric on the face, arms, and legs.  Coordination: The patient has good finger-nose-finger and heel-to-shin bilaterally.  Gait and station: The patient has a normal gait. Tandem gait is normal. Romberg is negative. No drift is seen.  Reflexes: Deep tendon reflexes are symmetric.   Assessment/Plan:  1. Lumbar spinal stenosis  The patient is doing relatively well at this time. He will contact our office if he requires another epidural steroid injection. He generally indicates that if he stays away from yard work, he seems to be better with the back. He appears to have some weakness with toe walking, may have some involvement of the S1 nerve roots bilaterally. He will follow-up in one year.  Jill Alexanders MD 06/10/2015 9:14 PM  [facility] [address] [place], [place] [address]  Phone [phone] Fax [phone]

## 2015-06-30 ENCOUNTER — Ambulatory Visit (AMBULATORY_SURGERY_CENTER): Payer: Medicare Other | Admitting: Internal Medicine

## 2015-06-30 ENCOUNTER — Encounter: Payer: Self-pay | Admitting: Internal Medicine

## 2015-06-30 VITALS — BP 111/64 | HR 61 | Temp 96.7°F | Resp 27 | Ht 71.5 in | Wt 200.0 lb

## 2015-06-30 DIAGNOSIS — Z86010 Personal history of colonic polyps: Secondary | ICD-10-CM

## 2015-06-30 DIAGNOSIS — I2510 Atherosclerotic heart disease of native coronary artery without angina pectoris: Secondary | ICD-10-CM | POA: Diagnosis not present

## 2015-06-30 DIAGNOSIS — Z1211 Encounter for screening for malignant neoplasm of colon: Secondary | ICD-10-CM

## 2015-06-30 MED ORDER — SODIUM CHLORIDE 0.9 % IV SOLN: 500.0000 mL | INTRAVENOUS | Status: DC

## 2015-06-30 NOTE — Op Note (Signed)
[place]  [address]. [place], [address]   COLONOSCOPY PROCEDURE REPORT  PATIENT: Larry Harris, Larry Harris  MR#: [id number] BIRTHDATE: 17-Sep-1941 , 73  yrs. old GENDER: male ENDOSCOPIST: Eustace Quail, MD REFERRED BY:.Direct Self PROCEDURE DATE:  06/30/2015 PROCEDURE:   Colonoscopy, screening First Screening Colonoscopy - Avg.  risk and is 50 yrs.  old or older - No.  Prior Negative Screening - Now for repeat screening. 10 or more years since last screening  History of Adenoma - Now for follow-up colonoscopy & has been > or = to 3 yrs.  N/A  Polyps removed today? No Recommend repeat exam, <10 yrs? No ASA CLASS:   Class II INDICATIONS:Screening for colonic neoplasia and Colorectal Neoplasm Risk Assessment for this procedure is average risk. The patient reports prior negative colonoscopies in [facility]. Last exam 10 years ago; has moved in [place]; told to follow-up at this time. MEDICATIONS: Propofol 250 mg IV and Monitored anesthesia care  DESCRIPTION OF PROCEDURE:   After the risks benefits and alternatives of the procedure were thoroughly explained, informed consent was obtained.  The digital rectal exam revealed no abnormalities of the rectum.   The LB [id number]  endoscope was introduced through the anus and advanced to the cecum, which was identified by both the appendix and ileocecal valve. No adverse events experienced.   The quality of the prep was excellent. (MiraLax was used)  The instrument was then slowly withdrawn as the colon was fully examined. Estimated blood loss is zero unless otherwise noted in this procedure report.      COLON FINDINGS: A normal appearing cecum, ileocecal valve, and appendiceal orifice were identified.  The ascending, transverse, descending, sigmoid colon, and rectum appeared unremarkable. Retroflexed views revealed internal hemorrhoids. The time to cecum = 5.2 Withdrawal time = 7.0   The scope was  withdrawn and the procedure completed. COMPLICATIONS: There were no immediate complications.  ENDOSCOPIC IMPRESSION: 1. Normal colonoscopy  RECOMMENDATIONS: 1. Return to the care of your primary provider.  GI follow up as needed  eSigned:  Eustace Quail, MD 06/30/2015 9:50 AM   cc: The Patient   ; Sissy Hoff, M.D.

## 2015-06-30 NOTE — Progress Notes (Signed)
Transferred to recovery room. A/O x3, pleased with MAC.  VSS.  Report to Jane, RN. 

## 2015-06-30 NOTE — Patient Instructions (Signed)
YOU HAD AN ENDOSCOPIC PROCEDURE TODAY AT THE [facility]:   Refer to the procedure report that was given to you for any specific questions about what was found during the examination.  If the procedure report does not answer your questions, please call your gastroenterologist to clarify.  If you requested that your care partner not be given the details of your procedure findings, then the procedure report has been included in a sealed envelope for you to review at your convenience later.  YOU SHOULD EXPECT: Some feelings of bloating in the abdomen. Passage of more gas than usual.  Walking can help get rid of the air that was put into your GI tract during the procedure and reduce the bloating. If you had a lower endoscopy (such as a colonoscopy or flexible sigmoidoscopy) you may notice spotting of blood in your stool or on the toilet paper. If you underwent a bowel prep for your procedure, you may not have a normal bowel movement for a few days.  Please Note:  You might notice some irritation and congestion in your nose or some drainage.  This is from the oxygen used during your procedure.  There is no need for concern and it should clear up in a day or so.  SYMPTOMS TO REPORT IMMEDIATELY:   Following lower endoscopy (colonoscopy or flexible sigmoidoscopy):  Excessive amounts of blood in the stool  Significant tenderness or worsening of abdominal pains  Swelling of the abdomen that is new, acute  Fever of 100F or higher   For urgent or emergent issues, a gastroenterologist can be reached at any hour by calling [phone].   DIET: Your first meal following the procedure should be a small meal and then it is ok to progress to your normal diet. Heavy or fried foods are harder to digest and may make you feel nauseous or bloated.  Likewise, meals heavy in dairy and vegetables can increase bloating.  Drink plenty of fluids but you should avoid alcoholic beverages for 24  hours.  ACTIVITY:  You should plan to take it easy for the rest of today and you should NOT DRIVE or use heavy machinery until tomorrow (because of the sedation medicines used during the test).    FOLLOW UP: Our staff will call the number listed on your records the next business day following your procedure to check on you and address any questions or concerns that you may have regarding the information given to you following your procedure. If we do not reach you, we will leave a message.  However, if you are feeling well and you are not experiencing any problems, there is no need to return our call.  We will assume that you have returned to your regular daily activities without incident.  If any biopsies were taken you will be contacted by phone or by letter within the next 1-3 weeks.  Please call us at [phone] if you have not heard about the biopsies in 3 weeks.    SIGNATURES/CONFIDENTIALITY: You and/or your care partner have signed paperwork which will be entered into your electronic medical record.  These signatures attest to the fact that that the information above on your After Visit Summary has been reviewed and is understood.  Full responsibility of the confidentiality of this discharge information lies with you and/or your care-partner. 

## 2015-07-01 ENCOUNTER — Telehealth: Payer: Self-pay | Admitting: *Deleted

## 2015-07-01 NOTE — Telephone Encounter (Signed)
  Follow up Call-  Call back number 06/30/2015  Post procedure Call Back phone  # [phone] cell  Permission to leave phone message Yes     Patient questions:  Do you have a fever, pain , or abdominal swelling? No. Pain Score  0 *  Have you tolerated food without any problems? Yes.    Have you been able to return to your normal activities? Yes.    Do you have any questions about your discharge instructions: Diet   No. Medications  No. Follow up visit  No.  Do you have questions or concerns about your Care? No.  Actions: * If pain score is 4 or above: No action needed, pain <4.

## 2015-07-13 DIAGNOSIS — Z23 Encounter for immunization: Secondary | ICD-10-CM | POA: Diagnosis not present

## 2015-07-15 DIAGNOSIS — D045 Carcinoma in situ of skin of trunk: Secondary | ICD-10-CM | POA: Diagnosis not present

## 2015-07-15 DIAGNOSIS — L570 Actinic keratosis: Secondary | ICD-10-CM | POA: Diagnosis not present

## 2015-07-15 DIAGNOSIS — L821 Other seborrheic keratosis: Secondary | ICD-10-CM | POA: Diagnosis not present

## 2015-07-15 DIAGNOSIS — Z85828 Personal history of other malignant neoplasm of skin: Secondary | ICD-10-CM | POA: Diagnosis not present

## 2015-07-15 DIAGNOSIS — L578 Other skin changes due to chronic exposure to nonionizing radiation: Secondary | ICD-10-CM | POA: Diagnosis not present

## 2015-07-15 DIAGNOSIS — L718 Other rosacea: Secondary | ICD-10-CM | POA: Diagnosis not present

## 2015-07-15 DIAGNOSIS — D1801 Hemangioma of skin and subcutaneous tissue: Secondary | ICD-10-CM | POA: Diagnosis not present

## 2015-07-15 DIAGNOSIS — Z08 Encounter for follow-up examination after completed treatment for malignant neoplasm: Secondary | ICD-10-CM | POA: Diagnosis not present

## 2015-07-15 DIAGNOSIS — D485 Neoplasm of uncertain behavior of skin: Secondary | ICD-10-CM | POA: Diagnosis not present

## 2015-07-15 DIAGNOSIS — D2371 Other benign neoplasm of skin of right lower limb, including hip: Secondary | ICD-10-CM | POA: Diagnosis not present

## 2015-07-22 DIAGNOSIS — I959 Hypotension, unspecified: Secondary | ICD-10-CM | POA: Diagnosis not present

## 2015-07-22 DIAGNOSIS — M1990 Unspecified osteoarthritis, unspecified site: Secondary | ICD-10-CM | POA: Diagnosis not present

## 2015-07-22 DIAGNOSIS — I2510 Atherosclerotic heart disease of native coronary artery without angina pectoris: Secondary | ICD-10-CM | POA: Diagnosis not present

## 2015-07-22 DIAGNOSIS — D696 Thrombocytopenia, unspecified: Secondary | ICD-10-CM | POA: Diagnosis not present

## 2015-07-22 DIAGNOSIS — E559 Vitamin D deficiency, unspecified: Secondary | ICD-10-CM | POA: Diagnosis not present

## 2015-07-22 DIAGNOSIS — Z125 Encounter for screening for malignant neoplasm of prostate: Secondary | ICD-10-CM | POA: Diagnosis not present

## 2016-01-12 DIAGNOSIS — L814 Other melanin hyperpigmentation: Secondary | ICD-10-CM | POA: Diagnosis not present

## 2016-01-12 DIAGNOSIS — D1801 Hemangioma of skin and subcutaneous tissue: Secondary | ICD-10-CM | POA: Diagnosis not present

## 2016-01-12 DIAGNOSIS — Z7189 Other specified counseling: Secondary | ICD-10-CM | POA: Diagnosis not present

## 2016-01-12 DIAGNOSIS — L578 Other skin changes due to chronic exposure to nonionizing radiation: Secondary | ICD-10-CM | POA: Diagnosis not present

## 2016-01-12 DIAGNOSIS — L538 Other specified erythematous conditions: Secondary | ICD-10-CM | POA: Diagnosis not present

## 2016-01-12 DIAGNOSIS — L820 Inflamed seborrheic keratosis: Secondary | ICD-10-CM | POA: Diagnosis not present

## 2016-01-12 DIAGNOSIS — D485 Neoplasm of uncertain behavior of skin: Secondary | ICD-10-CM | POA: Diagnosis not present

## 2016-01-12 DIAGNOSIS — D0439 Carcinoma in situ of skin of other parts of face: Secondary | ICD-10-CM | POA: Diagnosis not present

## 2016-01-12 DIAGNOSIS — L570 Actinic keratosis: Secondary | ICD-10-CM | POA: Diagnosis not present

## 2016-01-12 DIAGNOSIS — L821 Other seborrheic keratosis: Secondary | ICD-10-CM | POA: Diagnosis not present

## 2016-01-25 DIAGNOSIS — Z0100 Encounter for examination of eyes and vision without abnormal findings: Secondary | ICD-10-CM | POA: Diagnosis not present

## 2016-01-25 DIAGNOSIS — Z961 Presence of intraocular lens: Secondary | ICD-10-CM | POA: Diagnosis not present

## 2016-01-25 DIAGNOSIS — H26493 Other secondary cataract, bilateral: Secondary | ICD-10-CM | POA: Diagnosis not present

## 2016-01-25 DIAGNOSIS — H35372 Puckering of macula, left eye: Secondary | ICD-10-CM | POA: Diagnosis not present

## 2016-02-15 ENCOUNTER — Ambulatory Visit (INDEPENDENT_AMBULATORY_CARE_PROVIDER_SITE_OTHER): Payer: Medicare Other | Admitting: Family Medicine

## 2016-02-15 ENCOUNTER — Encounter: Payer: Self-pay | Admitting: Family Medicine

## 2016-02-15 VITALS — BP 136/83 | HR 65 | Ht 72.0 in | Wt 195.0 lb

## 2016-02-15 DIAGNOSIS — M4806 Spinal stenosis, lumbar region: Secondary | ICD-10-CM

## 2016-02-15 DIAGNOSIS — E663 Overweight: Secondary | ICD-10-CM

## 2016-02-15 DIAGNOSIS — Z0000 Encounter for general adult medical examination without abnormal findings: Secondary | ICD-10-CM | POA: Diagnosis not present

## 2016-02-15 DIAGNOSIS — N400 Enlarged prostate without lower urinary tract symptoms: Secondary | ICD-10-CM

## 2016-02-15 DIAGNOSIS — M48061 Spinal stenosis, lumbar region without neurogenic claudication: Secondary | ICD-10-CM

## 2016-02-15 MED ORDER — GABAPENTIN 600 MG PO TABS: 600.0000 mg | ORAL_TABLET | Freq: Every day | ORAL | Status: DC

## 2016-02-15 MED ORDER — DOXAZOSIN MESYLATE 2 MG PO TABS: 2.0000 mg | ORAL_TABLET | Freq: Every day | ORAL | Status: DC

## 2016-02-15 NOTE — Assessment & Plan Note (Addendum)
Patient discussed me that he plans to lose a few pounds by watching his diet more in the near future.  5-10 lbs should be his long-term goal with 1 pound per week short-term goal.

## 2016-02-15 NOTE — Patient Instructions (Signed)
BPH (benign prostatic hyperplasia) - Patient was not aware that he had a prostate problem and that's why he was using his doxazosin initially. Upon questioning she did say he took a medicine to help him not get up so frequently at bedtime to urinate.  We discussed BPH, pathophysiology & signs\ symptoms. - Refill meds. No s-e and tolerating well - stable sx w/o complaints  Spinal stenosis of lumbar region - Refill gabapentin 600 mg daily at bedtime, continue 300 mg every morning for now. In future we will give him 600mg  tabs and take 1/2 in am and one q hs.  - Follow-up [facility] as planned. - sx stable.   Overweight (BMI 25.0-29.9) Patient discussed me that he plans to lose a few pounds by watching his diet more in the near future.  5-10 lbs should be his long-term goal with 1 pound per week short-term goal.    Spinal Stenosis Spinal stenosis is an abnormal narrowing of the canals of your spine (vertebrae). CAUSES  Spinal stenosis is caused by areas of bone pushing into the central canals of your vertebrae. This condition can be present at birth (congenital). It also may be caused by arthritic deterioration of your vertebrae (spinal degeneration).  SYMPTOMS   Pain that is generally worse with activities, particularly standing and walking.  Numbness, tingling, hot or cold sensations, weakness, or weariness in your legs.  Frequent episodes of falling.  A foot-slapping gait that leads to muscle weakness. DIAGNOSIS  Spinal stenosis is diagnosed with the use of magnetic resonance imaging (MRI) or computed tomography (CT). TREATMENT  Initial therapy for spinal stenosis focuses on the management of the pain and other symptoms associated with the condition. These therapies include:  Practicing postural changes to lessen pressure on your nerves.  Exercises to strengthen the core of your body.  Loss of excess body weight.  The use of nonsteroidal anti-inflammatory medicines to reduce  swelling and inflammation in your nerves.  Benign Prostatic Hyperplasia An enlarged prostate (benign prostatic hyperplasia) is common in older men. You may experience the following:  Weak urine stream.  Dribbling.  Feeling like the bladder has not emptied completely.  Difficulty starting urination.  Getting up frequently at night to urinate.  Urinating more frequently during the day. HOME CARE INSTRUCTIONS  Monitor your prostatic hyperplasia for any changes. The following actions may help to alleviate any discomfort you are experiencing:  Give yourself time when you urinate.  Stay away from alcohol.  Avoid beverages containing caffeine, such as coffee, tea, and colas, because they can make the problem worse.  Avoid decongestants, antihistamines, and some prescription medicines that can make the problem worse.  Follow up with your health care provider for further treatment as recommended. SEEK MEDICAL CARE IF:  You are experiencing progressive difficulty voiding.  Your urine stream is progressively getting narrower.  You are awaking from sleep with the urge to void more frequently.  You are constantly feeling the need to void.  You experience loss of urine, especially in small amounts. SEEK IMMEDIATE MEDICAL CARE IF:   You develop increased pain with urination or are unable to urinate.  You develop severe abdominal pain, vomiting, a high fever, or fainting.  You develop back pain or blood in your urine. MAKE SURE YOU:   Understand these instructions.  Will watch your condition.  Will get help right away if you are not doing well or get worse.   This information is not intended to replace advice given  to you by your health care provider. Make sure you discuss any questions you have with your health care provider.   Document Released: 09/12/2005 Document Revised: 10/03/2014 Document Reviewed: 02/12/2013 Elsevier Interactive Patient Education [employer].  When therapies to manage pain are not successful, surgery to treat spinal stenosis may be recommended. This surgery involves removing excess bone, which puts pressure on your nerve roots. During this surgery (laminectomy), the posterior boney arch (lamina) and excess bone around the facet joints are removed.   This information is not intended to replace advice given to you by your health care provider. Make sure you discuss any questions you have with your health care provider.   Document Released: 12/03/2003 Document Revised: 10/03/2014 Document Reviewed: 12/21/2012 Elsevier Interactive Patient Education [employer].

## 2016-02-15 NOTE — Assessment & Plan Note (Signed)
-   Refill gabapentin 600 mg daily at bedtime, continue 300 mg every morning for now. In future we will give him 600mg  tabs and take 1/2 in am and one q hs.  - Follow-up [facility] as planned. - sx stable.

## 2016-02-15 NOTE — Assessment & Plan Note (Addendum)
-   Patient was not aware that he had a prostate problem and that's why he was using his doxazosin initially. Upon questioning she did say he took a medicine to help him not get up so frequently at bedtime to urinate.  We discussed BPH, pathophysiology & signs\ symptoms. - Refill meds. No s-e and tolerating well - stable sx w/o complaints

## 2016-02-15 NOTE — Progress Notes (Signed)
Larry Harris is a 75 y.o. male who presents to [place] [facility] today as a new patient to become established with [place]. He recently moved here from a [place] town outside of [facility].    Seen by [facility] neurology for spinal stenosis by Dr. Floyde Parkins. He has a history of getting to prior epidural steroid injections, last one was in the fall of 2015 approximately.  Patient has been on Neurontin for several years with good symptom control of his radicular symptoms that he had prior to starting the meds. He takes Advil in addition for his pain but this is rare.  He denies any numbness tingling or radiation of pain. He denies any dysfunction or muscle weakness in his lower extremities.  [place] in which the [place] is done for him has helped him tremendously.  He states that without pushing a lawnmower or digging holes for his plantings, his symptoms have been much better controlled.  Patient tries to walk around the neighborhood for at least 30 minutes most days of the week now.  He used to be an avid exerciser and used to go to the gym but now states that is too much for him.    Seen by [facility] dermatology, Dr. Clyda Greener, for skin screenings and various treatments.  Patient wishes to continue with them for this.    Patient tells me he had a colonoscopy in October 2016 which was normal and told she does not have to return for any future ones     Patient Active Problem List   Diagnosis Date Noted  . BPH (benign prostatic hyperplasia) 02/15/2016  . Spinal stenosis of lumbar region 06/04/2014  . Abnormal EKG 05/07/2011    Past Surgical History  Procedure Laterality Date  . Hernia repair  1965    x2  . Appendectomy  1958  . Bilateral cateracts removed    . Colonoscopy     Social History  Substance Use Topics  . Smoking status:  Former Smoker -- 1.00 packs/day for 25 years    Types: Cigarettes    Quit date: 07/27/1994  . Smokeless tobacco: Never Used  . Alcohol Use: 0.6 oz/week    1 Glasses of wine per week     Comment: occasional   family history includes Heart failure (age of onset: 61) in his brother; Prostate cancer (age of onset: 34) in his father; Stroke (age of onset: 53) in his mother. There is no history of Colon cancer, Esophageal cancer, Rectal cancer, or Stomach cancer.  ROS: Const:    no fevers, chills Eyes:    conjunctiva clear, no vision changes or blurred vision ENT:  no hearing difficulties, no dysphagia, no dysphonia, no nose bleeds CV:   no chest pain, arrhythmias, no orthopnea, no PND Pulm:   no SOB at rest or exertion, no Wheeze, no DIB, no hemoptysis GI:    no N/V/D/C, no abd pain GU:   no blood in urine or inc freq or urgency Heme/Onc:    no unexplained bleeding, no night sweats, no more fatigue than usual Neuro:   No dizziness, no LOC, No unexplained weakness or numbness Endo:   no unexplained wt loss or gain M-Sk:   no localized myalgias or arthralgias Psych:    No SI/HI, no memory prob or unexplained confusion   Medications: Current Outpatient Prescriptions  Medication Sig Dispense Refill  . aspirin 325 MG tablet Take 325 mg by mouth daily.      Marland Kitchen b complex vitamins tablet Take 1 tablet by mouth daily.    . calcium carbonate (OS-CAL) 600 MG TABS Take 600 mg by mouth daily.      . diphenhydrAMINE (BENADRYL) 25 mg capsule Take 25 mg by mouth as needed (insomnia).    Marland Kitchen doxazosin (CARDURA) 2 MG tablet Take 2 mg by mouth at bedtime.      . gabapentin (NEURONTIN) 300 MG capsule Take 300 mg by mouth daily.      Marland Kitchen gabapentin (NEURONTIN) 600 MG tablet Take 600 mg by mouth daily.      . Multiple Vitamin (MULTIVITAMIN) capsule Take 1 capsule by mouth daily.      . naproxen sodium (ANAPROX) 220 MG tablet Take 220 mg by mouth 2 (two) times daily with a meal. Sciatic nerve pain    . vitamin  B-12 (CYANOCOBALAMIN) 1000 MCG tablet Take 1,000 mcg by mouth daily.      Marland Kitchen VITAMIN D, CHOLECALCIFEROL, PO Take 1,000 Units by mouth daily.      No current facility-administered medications for this visit.   No Known Allergies   Exam:  BP 136/83 mmHg  Pulse 65  Ht 6' (1.829 m)  Wt 195 lb (88.451 kg)  BMI 26.44 kg/m2  SpO2 97% Gen: Well NAD, A and O *3 HEENT: [place]/AT, EOMI,  MMM, no carotid bruits bilaterally Lungs: Normal work of breathing. CTA B/L, Anterior/posterior, no Wh, rhonchi Heart: RRR, S1, S2 WNL's, no MRG Abd: Soft. No gross distention Exts: warm, pink,  Brisk capillary refill, warm and well perfused. No lower extremity edema.   No results found for this or any previous visit (from the past 24 hour(s)). No results found.   Please see individual assessment and plan sections.  PLAN:   ---->  Patient would like to get a set schedule in terms of his yearly labs and physical. He wishes to make this every November, his birth month, for ease of remembering.   ( At that time we'll obtain a CBC, CMP, A1c, fasting lipid profile, TSH, vitamin D, and PSA, along with any other necessary testing and immunization updating etc) .    He will schedule this for the future.  We plan on bringing him in early for lab work and then have his physical approximately one week later.  BPH (benign prostatic hyperplasia) - Patient was not aware that he had a prostate problem and that's why he was using his doxazosin initially. Upon questioning she did say he took a medicine to help him not get up so frequently at bedtime to urinate.  We discussed BPH, pathophysiology & signs\ symptoms. - Refill meds. No s-e and tolerating well - stable sx w/o complaints  Spinal stenosis of lumbar region - Refill gabapentin 600 mg daily at bedtime, continue 300 mg every morning for now. In future we will give him 600mg  tabs and take 1/2 in am and one q hs.  - Follow-up [facility] as planned. - sx stable.    Overweight (BMI 25.0-29.9) Patient discussed me that he plans to lose a few pounds by watching his diet more in the near future.  5-10 lbs should be his long-term goal with 1 pound per week short-term goal.

## 2016-02-16 ENCOUNTER — Encounter: Payer: Self-pay | Admitting: Family Medicine

## 2016-03-10 DIAGNOSIS — L711 Rhinophyma: Secondary | ICD-10-CM | POA: Diagnosis not present

## 2016-03-10 DIAGNOSIS — D0439 Carcinoma in situ of skin of other parts of face: Secondary | ICD-10-CM | POA: Diagnosis not present

## 2016-03-24 ENCOUNTER — Telehealth: Payer: Self-pay

## 2016-03-24 NOTE — Telephone Encounter (Signed)
I spoke to pt and advised him that his appt on 06/08/16 with Jinny Blossom, NP needs to be rescheduled. Pt is agreeable to coming in on 06/06/16 at 9:00. Pt verbalized understanding.

## 2016-03-30 ENCOUNTER — Other Ambulatory Visit: Payer: Self-pay | Admitting: Family Medicine

## 2016-03-30 DIAGNOSIS — M48061 Spinal stenosis, lumbar region without neurogenic claudication: Secondary | ICD-10-CM

## 2016-03-30 MED ORDER — GABAPENTIN 600 MG PO TABS: ORAL_TABLET | ORAL | Status: DC

## 2016-03-31 ENCOUNTER — Other Ambulatory Visit: Payer: Self-pay | Admitting: Family Medicine

## 2016-03-31 ENCOUNTER — Encounter: Payer: Self-pay | Admitting: Family Medicine

## 2016-03-31 ENCOUNTER — Telehealth: Payer: Self-pay

## 2016-03-31 DIAGNOSIS — M48061 Spinal stenosis, lumbar region without neurogenic claudication: Secondary | ICD-10-CM

## 2016-03-31 MED ORDER — GABAPENTIN 300 MG PO CAPS: ORAL_CAPSULE | ORAL | Status: DC

## 2016-03-31 NOTE — Telephone Encounter (Signed)
Pt called stating that his insurance will not pay for gabapentin 600mg  1/2 tablet qam and 1 tablet nightly.  Pt states that he needs 2 separate RXs for 300mg  capsules qam and 600mg  nightly.  Pt also requests 90 days supply.  Please send corrected RXs.  Charyl Bigger, CMA

## 2016-03-31 NOTE — Progress Notes (Signed)
Note from my CMA earlier today:  Pt called stating that his insurance will not pay for gabapentin 600mg  1/2 tablet qam and 1 tablet nightly. Pt states that he needs 2 separate RXs for 300mg  capsules qam and 600mg  nightly. Pt also requests 90 days supply. Please send corrected RXs. Charyl Bigger, CMA  Tonya please call him and let him know that I changed the dose and sent it to his pharmacy. He will need to see me for for follow-up in August and at that time I will give him a refill of it if it's working out well for him.  Please let him know that he can also get this medicine from his neurologist Dr. Jannifer Franklin, who is actually the one treating his neuropathy

## 2016-03-31 NOTE — Telephone Encounter (Signed)
Note from my CMA earlier today:   Pt called stating that his insurance will not pay for gabapentin 600mg  1/2 tablet qam and 1 tablet nightly. Pt states that he needs 2 separate RXs for 300mg  capsules qam and 600mg  nightly. Pt also requests 90 days supply. Please send corrected RXs. Charyl Bigger, CMA  I refilled meds at the 300 mg capsules. I prescribed one every morning and 2 every afternoon which is the same dose he has always been on.

## 2016-04-01 ENCOUNTER — Telehealth: Payer: Self-pay

## 2016-04-01 NOTE — Telephone Encounter (Signed)
LVM for pt to call to discuss medication dosage change and follow up instructions.  Charyl Bigger, CMA

## 2016-04-01 NOTE — Progress Notes (Signed)
ENCOUNTER OPENED IN ERROR.  Charyl Bigger, CMA

## 2016-04-04 NOTE — Progress Notes (Signed)
Patient advised of recommendations.  

## 2016-06-06 ENCOUNTER — Encounter: Payer: Self-pay | Admitting: Adult Health

## 2016-06-06 ENCOUNTER — Ambulatory Visit (INDEPENDENT_AMBULATORY_CARE_PROVIDER_SITE_OTHER): Payer: Medicare Other | Admitting: Adult Health

## 2016-06-06 VITALS — BP 123/71 | HR 64 | Resp 18 | Ht 72.0 in | Wt 193.0 lb

## 2016-06-06 DIAGNOSIS — M48061 Spinal stenosis, lumbar region without neurogenic claudication: Secondary | ICD-10-CM

## 2016-06-06 DIAGNOSIS — M4806 Spinal stenosis, lumbar region: Secondary | ICD-10-CM

## 2016-06-06 NOTE — Patient Instructions (Signed)
Call if pain gets worse and we will order epidural steroid injection If your symptoms worsen or you develop new symptoms please let us know.

## 2016-06-06 NOTE — Progress Notes (Signed)
PATIENT: Larry Harris DOB: 09/19/1941  REASON FOR VISIT: follow up- back pain HISTORY FROM: patient  HISTORY OF PRESENT ILLNESS: Larry Harris is a 75 year old male with a history of lumbar spinal stenosis. He returns today for follow-up. He reports that everything has relatively remained the same. He continues to have dull pain that radiates down the right leg however at this point this is tolerable. He states that he tries to walk half a mile each day. He states with increasing activity his pain will sometimes increase. He states he recently moved to a town home setting no longer has to complete yardwork. He states that steps can sometimes irritate his back pain. Overall he feels that he is doing well. He is gotten good relief with epidural steroid injections in the past. He returns today for an evaluation.  HISTORY 06/10/15: Larry Harris is a 75 year old right-handed white male with a history of lumbar spinal stenosis at the L4-5 level. The patient has done very well since last seen. He got an epidural steroid injection one year ago, he indicates that he may get 4-6 months of benefit from the injection. He has moved to a town home, and he is not doing as much yard work, and his back has done much better. He has been walking some, and he indicates that he cannot run. He otherwise has not noted any other new medical issues that have come up since last seen. Currently, he is not in a lot of discomfort with the back. At times, he may have some discomfort going down into the right leg into the gastrocnemius muscle primarily.  REVIEW OF SYSTEMS: Out of a complete 14 system review of symptoms, the patient complains only of the following symptoms, and all other reviewed systems are negative.  See history of present illness  ALLERGIES: No Known Allergies  HOME MEDICATIONS: Outpatient Medications Prior to Visit  Medication Sig Dispense Refill  . aspirin 325 MG tablet Take 325 mg by mouth  daily.      Marland Kitchen b complex vitamins tablet Take 1 tablet by mouth daily.    . calcium carbonate (OS-CAL) 600 MG TABS Take 600 mg by mouth daily.      . diphenhydrAMINE (BENADRYL) 25 mg capsule Take 25 mg by mouth as needed (insomnia).    Marland Kitchen doxazosin (CARDURA) 2 MG tablet Take 1 tablet (2 mg total) by mouth at bedtime. 90 tablet 1  . gabapentin (NEURONTIN) 300 MG capsule 1 Every morning and 2 caps before bedtime daily 270 capsule 0  . Multiple Vitamin (MULTIVITAMIN) capsule Take 1 capsule by mouth daily.      . naproxen sodium (ANAPROX) 220 MG tablet Take 220 mg by mouth 2 (two) times daily with a meal. Sciatic nerve pain    . vitamin B-12 (CYANOCOBALAMIN) 1000 MCG tablet Take 1,000 mcg by mouth daily.      Marland Kitchen VITAMIN D, CHOLECALCIFEROL, PO Take 1,000 Units by mouth daily.      No facility-administered medications prior to visit.     PAST MEDICAL HISTORY: Past Medical History:  Diagnosis Date  . Acute rhinitis   . Allergic rhinitis, cause unspecified   . Allergy   . BPH (benign prostatic hyperplasia)   . CAD (coronary artery disease)   . Cancer ([place])    scalp basal cell skin ca  . Cataract    bilateral cateracts removed  . Clotting disorder ([place])   . Hypercholesteremia   . Hypotension   . Neuralgia, neuritis,  and radiculitis, unspecified   . Osteoarthritis   . Spinal stenosis of lumbar region 06/04/2014  . Thrombocytopenia, unspecified ([place])     PAST SURGICAL HISTORY: Past Surgical History:  Procedure Laterality Date  . APPENDECTOMY  1958  . bilateral cateracts removed    . COLONOSCOPY    . HERNIA REPAIR  1965   x2    FAMILY HISTORY: Family History  Problem Relation Age of Onset  . Stroke Mother 58  . Prostate cancer Father 30  . Heart failure Brother 40  . Colon cancer Neg Hx   . Esophageal cancer Neg Hx   . Rectal cancer Neg Hx   . Stomach cancer Neg Hx     SOCIAL HISTORY: Social History   Social History  . Marital status: Married    Spouse name: N/A  .  Number of children: 2  . Years of education: college 4   Occupational History  . Retired    Social History Main Topics  . Smoking status: Former Smoker    Packs/day: 1.00    Years: 25.00    Types: Cigarettes    Quit date: 07/27/1994  . Smokeless tobacco: Never Used  . Alcohol use 0.6 oz/week    1 Glasses of wine per week     Comment: occasional  . Drug use: No  . Sexual activity: Not on file   Other Topics Concern  . Not on file   Social History Narrative   The patient is widowed. 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 He has no history of alcohol use. He has previously done regular exercise until developing a back problem.      Patient drinks about 3-4 cups of coffee daily.   Patient is right handed.       PHYSICAL EXAM  Vitals:   06/06/16 0853  BP: 123/71  Pulse: 64  Resp: 18  Weight: 193 lb (87.5 kg)  Height: 6' (1.829 m)   Body mass index is 26.18 kg/m.  Generalized: Well developed, in no acute distress   Neurological examination  Mentation: Alert oriented to time, place, history taking. Follows all commands speech and language fluent Cranial nerve II-XII: Pupils were equal round reactive to light. Extraocular movements were full, visual field were full on confrontational test. Facial sensation and strength were normal. Uvula tongue midline. Head turning and shoulder shrug  were normal and symmetric. Motor: The motor testing reveals 5 over 5 strength of all 4 extremities. Good symmetric motor tone is noted throughout.  Sensory: Sensory testing is intact to soft touch on all 4 extremities. No evidence of extinction is noted.  Coordination: Cerebellar testing reveals good finger-nose-finger and heel-to-shin bilaterally.  Gait and station: Gait is normal. Tandem gait is normal. Romberg is negative. No drift is seen.  Reflexes: Deep tendon reflexes are symmetric and normal bilaterally.   DIAGNOSTIC DATA (LABS, IMAGING, TESTING) - I reviewed patient  records, labs, notes, testing and imaging myself where available.     ASSESSMENT AND PLAN 75 y.o. year old male  has a past medical history of Acute rhinitis; Allergic rhinitis, cause unspecified; Allergy; BPH (benign prostatic hyperplasia); CAD (coronary artery disease); Cancer ([place]); Cataract; Clotting disorder ([place]); Hypercholesteremia; Hypotension; Neuralgia, neuritis, and radiculitis, unspecified; Osteoarthritis; Spinal stenosis of lumbar region (06/04/2014); and Thrombocytopenia, unspecified ([place]). here with:  1. Lumbar spinal stenosis  Overall the patient has remained stable. At this point the patient reports that his back pain is tolerable. I have advised that if  his pain increases he should call and we will schedule an epidural steroid injection for him. He voices understanding. He will follow-up in one year with Dr. Jannifer Franklin.    Ward Givens, MSN, NP-C 06/06/2016, 9:03 AM [facility] [address] [place], [place] [address] [phone]

## 2016-06-06 NOTE — Progress Notes (Signed)
I have read the note, and I agree with the clinical assessment and plan.  Larry Harris,Larry Harris   

## 2016-06-08 ENCOUNTER — Ambulatory Visit: Payer: Medicare Other | Admitting: Adult Health

## 2016-06-30 ENCOUNTER — Other Ambulatory Visit: Payer: Self-pay | Admitting: Family Medicine

## 2016-07-12 DIAGNOSIS — Z23 Encounter for immunization: Secondary | ICD-10-CM | POA: Diagnosis not present

## 2016-07-25 ENCOUNTER — Ambulatory Visit (INDEPENDENT_AMBULATORY_CARE_PROVIDER_SITE_OTHER): Payer: Medicare Other | Admitting: Family Medicine

## 2016-07-25 ENCOUNTER — Encounter: Payer: Self-pay | Admitting: Family Medicine

## 2016-07-25 DIAGNOSIS — Z0000 Encounter for general adult medical examination without abnormal findings: Secondary | ICD-10-CM

## 2016-07-25 NOTE — Progress Notes (Signed)
Lab visit only. 

## 2016-07-26 LAB — CBC WITH DIFFERENTIAL/PLATELET
BASOS ABS: 60 {cells}/uL (ref 0–200)
Basophils Relative: 1 %
Eosinophils Absolute: 120 cells/uL (ref 15–500)
Eosinophils Relative: 2 %
HEMATOCRIT: 43 % (ref 38.5–50.0)
Hemoglobin: 14.9 g/dL (ref 13.2–17.1)
LYMPHS ABS: 1800 {cells}/uL (ref 850–3900)
Lymphocytes Relative: 30 %
MCH: 32.5 pg (ref 27.0–33.0)
MCHC: 34.7 g/dL (ref 32.0–36.0)
MCV: 93.7 fL (ref 80.0–100.0)
MONO ABS: 540 {cells}/uL (ref 200–950)
MPV: 11.7 fL (ref 7.5–12.5)
Monocytes Relative: 9 %
Neutro Abs: 3480 cells/uL (ref 1500–7800)
Neutrophils Relative %: 58 %
Platelets: 88 10*3/uL — ABNORMAL LOW (ref 140–400)
RBC: 4.59 MIL/uL (ref 4.20–5.80)
RDW: 13.9 % (ref 11.0–15.0)
WBC: 6 10*3/uL (ref 3.8–10.8)

## 2016-07-26 LAB — TSH: TSH: 1.84 mIU/L (ref 0.40–4.50)

## 2016-07-26 LAB — COMPREHENSIVE METABOLIC PANEL
ALBUMIN: 3.2 g/dL — AB (ref 3.6–5.1)
ALT: 13 U/L (ref 9–46)
AST: 13 U/L (ref 10–35)
Alkaline Phosphatase: 50 U/L (ref 40–115)
BUN: 20 mg/dL (ref 7–25)
CALCIUM: 8.5 mg/dL — AB (ref 8.6–10.3)
CO2: 29 mmol/L (ref 20–31)
Chloride: 103 mmol/L (ref 98–110)
Creat: 0.84 mg/dL (ref 0.70–1.18)
Glucose, Bld: 94 mg/dL (ref 65–99)
Potassium: 5.2 mmol/L (ref 3.5–5.3)
Sodium: 145 mmol/L (ref 135–146)
Total Bilirubin: 0.6 mg/dL (ref 0.2–1.2)
Total Protein: 5 g/dL — ABNORMAL LOW (ref 6.1–8.1)

## 2016-07-26 LAB — VITAMIN B12: Vitamin B-12: 799 pg/mL (ref 200–1100)

## 2016-07-26 LAB — LIPID PANEL
Cholesterol: 169 mg/dL (ref 125–200)
HDL: 50 mg/dL (ref >=40)
LDL Cholesterol: 106 mg/dL (ref <130)
TRIGLYCERIDES: 66 mg/dL (ref <150)
Total CHOL/HDL Ratio: 3.4 Ratio (ref <=5.0)
VLDL: 13 mg/dL (ref <30)

## 2016-07-26 LAB — HEMOGLOBIN A1C
Hgb A1c MFr Bld: 5.3 % (ref <5.7)
Mean Plasma Glucose: 105 mg/dL

## 2016-07-26 LAB — VITAMIN D 25 HYDROXY (VIT D DEFICIENCY, FRACTURES): Vit D, 25-Hydroxy: 52 ng/mL (ref 30–100)

## 2016-08-01 ENCOUNTER — Encounter: Payer: Self-pay | Admitting: Family Medicine

## 2016-08-01 ENCOUNTER — Ambulatory Visit (INDEPENDENT_AMBULATORY_CARE_PROVIDER_SITE_OTHER): Payer: Medicare Other | Admitting: Family Medicine

## 2016-08-01 VITALS — BP 119/77 | HR 78 | Ht 72.0 in | Wt 193.0 lb

## 2016-08-01 DIAGNOSIS — Z1211 Encounter for screening for malignant neoplasm of colon: Secondary | ICD-10-CM | POA: Diagnosis not present

## 2016-08-01 DIAGNOSIS — Z7189 Other specified counseling: Secondary | ICD-10-CM | POA: Diagnosis not present

## 2016-08-01 DIAGNOSIS — Z0000 Encounter for general adult medical examination without abnormal findings: Secondary | ICD-10-CM

## 2016-08-01 DIAGNOSIS — D696 Thrombocytopenia, unspecified: Secondary | ICD-10-CM

## 2016-08-01 DIAGNOSIS — Z125 Encounter for screening for malignant neoplasm of prostate: Secondary | ICD-10-CM | POA: Diagnosis not present

## 2016-08-01 DIAGNOSIS — R7989 Other specified abnormal findings of blood chemistry: Secondary | ICD-10-CM | POA: Diagnosis not present

## 2016-08-01 DIAGNOSIS — Z23 Encounter for immunization: Secondary | ICD-10-CM

## 2016-08-01 LAB — POC HEMOCCULT BLD/STL (OFFICE/1-CARD/DIAGNOSTIC): Fecal Occult Blood, POC: NEGATIVE

## 2016-08-01 MED ORDER — CALCIUM CARBONATE-VITAMIN D 600-400 MG-UNIT PO TABS: 1.0000 | ORAL_TABLET | Freq: Two times a day (BID) | ORAL | 11 refills | Status: DC

## 2016-08-01 NOTE — Patient Instructions (Addendum)
--  please double your calcium intake---> don't need to double vit D  --  Inc protein intake- more eggs, grilled chicken and [place]    Preventive Care for Adults, Male A healthy lifestyle and preventive care can promote health and wellness. Preventive health guidelines for men include the following key practices:  A routine yearly physical is a good way to check with your health care provider about your health and preventative screening. It is a chance to share any concerns and updates on your health and to receive a thorough exam.  Visit your dentist for a routine exam and preventative care every 6 months. Brush your teeth twice a day and floss once a day. Good oral hygiene prevents tooth decay and gum disease.  The frequency of eye exams is based on your age, health, family medical history, use of contact lenses, and other factors. Follow your health care provider's recommendations for frequency of eye exams.  Eat a healthy diet. Foods such as vegetables, fruits, whole grains, low-fat dairy products, and lean protein foods contain the nutrients you need without too many calories. Decrease your intake of foods high in solid fats, added sugars, and salt. Eat the right amount of calories for you.Get information about a proper diet from your health care provider, if necessary.  Regular physical exercise is one of the most important things you can do for your health. Most adults should get at least 150 minutes of moderate-intensity exercise (any activity that increases your heart rate and causes you to sweat) each week. In addition, most adults need muscle-strengthening exercises on 2 or more days a week.  Maintain a healthy weight. The body mass index (BMI) is a screening tool to identify possible weight problems. It provides an estimate of body fat based on height and weight. Your health care provider can find your BMI and can help you achieve or maintain a healthy weight.For adults 20 years and  older:  A BMI below 18.5 is considered underweight.  A BMI of 18.5 to 24.9 is normal.  A BMI of 25 to 29.9 is considered overweight.  A BMI of 30 and above is considered obese.  Maintain normal blood lipids and cholesterol levels by exercising and minimizing your intake of saturated fat. Eat a balanced diet with plenty of fruit and vegetables. Blood tests for lipids and cholesterol should begin at age 3 and be repeated every 5 years. If your lipid or cholesterol levels are high, you are over 50, or you are at high risk for heart disease, you may need your cholesterol levels checked more frequently.Ongoing high lipid and cholesterol levels should be treated with medicines if diet and exercise are not working.  If you smoke, find out from your health care provider how to quit. If you do not use tobacco, do not start.  Lung cancer screening is recommended for adults aged 35-80 years who are at high risk for developing lung cancer because of a history of smoking. A yearly low-dose CT scan of the lungs is recommended for people who have at least a 30-pack-year history of smoking and are a current smoker or have quit within the past 15 years. A pack year of smoking is smoking an average of 1 pack of cigarettes a day for 1 year (for example: 1 pack a day for 30 years or 2 packs a day for 15 years). Yearly screening should continue until the smoker has stopped smoking for at least 15 years. Yearly screening should be  stopped for people who develop a health problem that would prevent them from having lung cancer treatment.  If you choose to drink alcohol, do not have more than 2 drinks per day. One drink is considered to be 12 ounces (355 mL) of beer, 5 ounces (148 mL) of wine, or 1.5 ounces (44 mL) of liquor.  Avoid use of street drugs. Do not share needles with anyone. Ask for help if you need support or instructions about stopping the use of drugs.  High blood pressure causes heart disease and  increases the risk of stroke. Your blood pressure should be checked at least every 1-2 years. Ongoing high blood pressure should be treated with medicines, if weight loss and exercise are not effective.  If you are 65-75 years old, ask your health care provider if you should take aspirin to prevent heart disease.  Diabetes screening is done by taking a blood sample to check your blood glucose level after you have not eaten for a certain period of time (fasting). If you are not overweight and you do not have risk factors for diabetes, you should be screened once every 3 years starting at age 40. If you are overweight or obese and you are 79-35 years of age, you should be screened for diabetes every year as part of your cardiovascular risk assessment.  Colorectal cancer can be detected and often prevented. Most routine colorectal cancer screening begins at the age of 24 and continues through age 44. However, your health care provider may recommend screening at an earlier age if you have risk factors for colon cancer. On a yearly basis, your health care provider may provide home test kits to check for hidden blood in the stool. Use of a small camera at the end of a tube to directly examine the colon (sigmoidoscopy or colonoscopy) can detect the earliest forms of colorectal cancer. Talk to your health care provider about this at age 24, when routine screening begins. Direct exam of the colon should be repeated every 5-10 years through age 39, unless early forms of precancerous polyps or small growths are found.  People who are at an increased risk for hepatitis B should be screened for this virus. You are considered at high risk for hepatitis B if:  You were born in a country where hepatitis B occurs often. Talk with your health care provider about which countries are considered high risk.  Your parents were born in a high-risk country and you have not received a shot to protect against hepatitis B  (hepatitis B vaccine).  You have HIV or AIDS.  You use needles to inject street drugs.  You live with, or have sex with, someone who has hepatitis B.  You are a man who has sex with other men (MSM).  You get hemodialysis treatment.  You take certain medicines for conditions such as cancer, organ transplantation, and autoimmune conditions.  Hepatitis C blood testing is recommended for all people born from [birth year] through 1965 and any individual with known risks for hepatitis C.  Practice safe sex. Use condoms and avoid high-risk sexual practices to reduce the spread of sexually transmitted infections (STIs). STIs include gonorrhea, chlamydia, syphilis, trichomonas, herpes, HPV, and human immunodeficiency virus (HIV). Herpes, HIV, and HPV are viral illnesses that have no cure. They can result in disability, cancer, and death.  If you are a man who has sex with other men, you should be screened at least once per year for:  HIV.  Urethral, rectal, and pharyngeal infection of gonorrhea, chlamydia, or both.  If you are at risk of being infected with HIV, it is recommended that you take a prescription medicine daily to prevent HIV infection. This is called preexposure prophylaxis (PrEP). You are considered at risk if:  You are a man who has sex with other men (MSM) and have other risk factors.  You are a heterosexual man, are sexually active, and are at increased risk for HIV infection.  You take drugs by injection.  You are sexually active with a partner who has HIV.  Talk with your health care provider about whether you are at high risk of being infected with HIV. If you choose to begin PrEP, you should first be tested for HIV. You should then be tested every 3 months for as long as you are taking PrEP.  A one-time screening for abdominal aortic aneurysm (AAA) and surgical repair of large AAAs by ultrasound are recommended for men ages 30 to 34 years who are current or former  smokers.  Healthy men should no longer receive prostate-specific antigen (PSA) blood tests as part of routine cancer screening. Talk with your health care provider about prostate cancer screening.  Testicular cancer screening is not recommended for adult males who have no symptoms. Screening includes self-exam, a health care provider exam, and other screening tests. Consult with your health care provider about any symptoms you have or any concerns you have about testicular cancer.  Use sunscreen. Apply sunscreen liberally and repeatedly throughout the day. You should seek shade when your shadow is shorter than you. Protect yourself by wearing long sleeves, pants, a wide-brimmed hat, and sunglasses year round, whenever you are outdoors.  Once a month, do a whole-body skin exam, using a mirror to look at the skin on your back. Tell your health care provider about new moles, moles that have irregular borders, moles that are larger than a pencil eraser, or moles that have changed in shape or color.  Stay current with required vaccines (immunizations).  Influenza vaccine. All adults should be immunized every year.  Tetanus, diphtheria, and acellular pertussis (Td, Tdap) vaccine. An adult who has not previously received Tdap or who does not know his vaccine status should receive 1 dose of Tdap. This initial dose should be followed by tetanus and diphtheria toxoids (Td) booster doses every 10 years. Adults with an unknown or incomplete history of completing a 3-dose immunization series with Td-containing vaccines should begin or complete a primary immunization series including a Tdap dose. Adults should receive a Td booster every 10 years.  Varicella vaccine. An adult without evidence of immunity to varicella should receive 2 doses or a second dose if he has previously received 1 dose.  Human papillomavirus (HPV) vaccine. Males aged 11-21 years who have not received the vaccine previously should receive  the 3-dose series. Males aged 22-26 years may be immunized. Immunization is recommended through the age of 42 years for any male who has sex with males and did not get any or all doses earlier. Immunization is recommended for any person with an immunocompromised condition through the age of 30 years if he did not get any or all doses earlier. During the 3-dose series, the second dose should be obtained 4-8 weeks after the first dose. The third dose should be obtained 24 weeks after the first dose and 16 weeks after the second dose.  Zoster vaccine. One dose is recommended for adults aged 39 years or older  unless certain conditions are present.  Measles, mumps, and rubella (MMR) vaccine. Adults born before [birth year] generally are considered immune to measles and mumps. Adults born in [birth year] or later should have 1 or more doses of MMR vaccine unless there is a contraindication to the vaccine or there is laboratory evidence of immunity to each of the three diseases. A routine second dose of MMR vaccine should be obtained at least 28 days after the first dose for students attending postsecondary schools, health care workers, or international travelers. People who received inactivated measles vaccine or an unknown type of measles vaccine during 1963-1967 should receive 2 doses of MMR vaccine. People who received inactivated mumps vaccine or an unknown type of mumps vaccine before 1979 and are at high risk for mumps infection should consider immunization with 2 doses of MMR vaccine. Unvaccinated health care workers born before [birth year] who lack laboratory evidence of measles, mumps, or rubella immunity or laboratory confirmation of disease should consider measles and mumps immunization with 2 doses of MMR vaccine or rubella immunization with 1 dose of MMR vaccine.  Pneumococcal 13-valent conjugate (PCV13) vaccine. When indicated, a person who is uncertain of his immunization history and has no record of immunization should  receive the PCV13 vaccine. All adults 61 years of age and older should receive this vaccine. An adult aged 8 years or older who has certain medical conditions and has not been previously immunized should receive 1 dose of PCV13 vaccine. This PCV13 should be followed with a dose of pneumococcal polysaccharide (PPSV23) vaccine. Adults who are at high risk for pneumococcal disease should obtain the PPSV23 vaccine at least 8 weeks after the dose of PCV13 vaccine. Adults older than 77 years of age who have normal immune system function should obtain the PPSV23 vaccine dose at least 1 year after the dose of PCV13 vaccine.  Pneumococcal polysaccharide (PPSV23) vaccine. When PCV13 is also indicated, PCV13 should be obtained first. All adults aged 14 years and older should be immunized. An adult younger than age 66 years who has certain medical conditions should be immunized. Any person who resides in a nursing home or long-term care facility should be immunized. An adult smoker should be immunized. People with an immunocompromised condition and certain other conditions should receive both PCV13 and PPSV23 vaccines. People with human immunodeficiency virus (HIV) infection should be immunized as soon as possible after diagnosis. Immunization during chemotherapy or radiation therapy should be avoided. Routine use of PPSV23 vaccine is not recommended for American Indians, [place] Natives, or people younger than 65 years unless there are medical conditions that require PPSV23 vaccine. When indicated, people who have unknown immunization and have no record of immunization should receive PPSV23 vaccine. One-time revaccination 5 years after the first dose of PPSV23 is recommended for people aged 19-64 years who have chronic kidney failure, nephrotic syndrome, asplenia, or immunocompromised conditions. People who received 1-2 doses of PPSV23 before age 46 years should receive another dose of PPSV23 vaccine at age 52 years or later  if at least 5 years have passed since the previous dose. Doses of PPSV23 are not needed for people immunized with PPSV23 at or after age 93 years.  Meningococcal vaccine. Adults with asplenia or persistent complement component deficiencies should receive 2 doses of quadrivalent meningococcal conjugate (MenACWY-D) vaccine. The doses should be obtained at least 2 months apart. Microbiologists working with certain meningococcal bacteria, [employer] recruits, people at risk during an outbreak, and people who travel to or live in countries  with a high rate of meningitis should be immunized. A first-year college student up through age 95 years who is living in a residence hall should receive a dose if he did not receive a dose on or after his 16th birthday. Adults who have certain high-risk conditions should receive one or more doses of vaccine.  Hepatitis A vaccine. Adults who wish to be protected from this disease, have chronic liver disease, work with hepatitis A-infected animals, work in hepatitis A research labs, or travel to or work in countries with a high rate of hepatitis A should be immunized. Adults who were previously unvaccinated and who anticipate close contact with an international adoptee during the first 60 days after arrival in the [place] from a country with a high rate of hepatitis A should be immunized.  Hepatitis B vaccine. Adults should be immunized if they wish to be protected from this disease, are under age 37 years and have diabetes, have chronic liver disease, have had more than one sex partner in the past 6 months, may be exposed to blood or other infectious body fluids, are household contacts or sex partners of hepatitis B positive people, are clients or workers in certain care facilities, or travel to or work in countries with a high rate of hepatitis B.  Haemophilus influenzae type b (Hib) vaccine. A previously unvaccinated person with asplenia or sickle cell disease or having a  scheduled splenectomy should receive 1 dose of Hib vaccine. Regardless of previous immunization, a recipient of a hematopoietic stem cell transplant should receive a 3-dose series 6-12 months after his successful transplant. Hib vaccine is not recommended for adults with HIV infection. Preventive Service / Frequency Ages 55 to 104  Blood pressure check.** / Every 3-5 years.  Lipid and cholesterol check.** / Every 5 years beginning at age 19.  Hepatitis C blood test.** / For any individual with known risks for hepatitis C.  Skin self-exam. / Monthly.  Influenza vaccine. / Every year.  Tetanus, diphtheria, and acellular pertussis (Tdap, Td) vaccine.** / Consult your health care provider. 1 dose of Td every 10 years.  Varicella vaccine.** / Consult your health care provider.  HPV vaccine. / 3 doses over 6 months, if 41 or younger.  Measles, mumps, rubella (MMR) vaccine.** / You need at least 1 dose of MMR if you were born in 1957 or later. You may also need a second dose.  Pneumococcal 13-valent conjugate (PCV13) vaccine.** / Consult your health care provider.  Pneumococcal polysaccharide (PPSV23) vaccine.** / 1 to 2 doses if you smoke cigarettes or if you have certain conditions.  Meningococcal vaccine.** / 1 dose if you are age 83 to 23 years and a Market researcher living in a residence hall, or have one of several medical conditions. You may also need additional booster doses.  Hepatitis A vaccine.** / Consult your health care provider.  Hepatitis B vaccine.** / Consult your health care provider.  Haemophilus influenzae type b (Hib) vaccine.** / Consult your health care provider. Ages 59 to 67  Blood pressure check.** / Every year.  Lipid and cholesterol check.** / Every 5 years beginning at age 38.  Lung cancer screening. / Every year if you are aged 46-80 years and have a 30-pack-year history of smoking and currently smoke or have quit within the past 15 years.  Yearly screening is stopped once you have quit smoking for at least 15 years or develop a health problem that would prevent you from having lung  cancer treatment.  Fecal occult blood test (FOBT) of stool. / Every year beginning at age 50 and continuing until age 52. You may not have to do this test if you get a colonoscopy every 10 years.  Flexible sigmoidoscopy** or colonoscopy.** / Every 5 years for a flexible sigmoidoscopy or every 10 years for a colonoscopy beginning at age 23 and continuing until age 9.  Hepatitis C blood test.** / For all people born from [birth year] through 1965 and any individual with known risks for hepatitis C.  Skin self-exam. / Monthly.  Influenza vaccine. / Every year.  Tetanus, diphtheria, and acellular pertussis (Tdap/Td) vaccine.** / Consult your health care provider. 1 dose of Td every 10 years.  Varicella vaccine.** / Consult your health care provider.  Zoster vaccine.** / 1 dose for adults aged 29 years or older.  Measles, mumps, rubella (MMR) vaccine.** / You need at least 1 dose of MMR if you were born in 1959 or later. You may also need a second dose.  Pneumococcal 13-valent conjugate (PCV13) vaccine.** / Consult your health care provider.  Pneumococcal polysaccharide (PPSV23) vaccine.** / 1 to 2 doses if you smoke cigarettes or if you have certain conditions.  Meningococcal vaccine.** / Consult your health care provider.  Hepatitis A vaccine.** / Consult your health care provider.  Hepatitis B vaccine.** / Consult your health care provider.  Haemophilus influenzae type b (Hib) vaccine.** / Consult your health care provider. Ages 34 and over  Blood pressure check.** / Every year.  Lipid and cholesterol check.**/ Every 5 years beginning at age 74.  Lung cancer screening. / Every year if you are aged 78-80 years and have a 30-pack-year history of smoking and currently smoke or have quit within the past 15 years. Yearly screening is stopped once you  have quit smoking for at least 15 years or develop a health problem that would prevent you from having lung cancer treatment.  Fecal occult blood test (FOBT) of stool. / Every year beginning at age 37 and continuing until age 72. You may not have to do this test if you get a colonoscopy every 10 years.  Flexible sigmoidoscopy** or colonoscopy.** / Every 5 years for a flexible sigmoidoscopy or every 10 years for a colonoscopy beginning at age 54 and continuing until age 17.  Hepatitis C blood test.** / For all people born from [birth year] through 1967 and any individual with known risks for hepatitis C.  Abdominal aortic aneurysm (AAA) screening.** / A one-time screening for ages 59 to 27 years who are current or former smokers.  Skin self-exam. / Monthly.  Influenza vaccine. / Every year.  Tetanus, diphtheria, and acellular pertussis (Tdap/Td) vaccine.** / 1 dose of Td every 10 years.  Varicella vaccine.** / Consult your health care provider.  Zoster vaccine.** / 1 dose for adults aged 5 years or older.  Pneumococcal 13-valent conjugate (PCV13) vaccine.** / 1 dose for all adults aged 21 years and older.  Pneumococcal polysaccharide (PPSV23) vaccine.** / 1 dose for all adults aged 61 years and older.  Meningococcal vaccine.** / Consult your health care provider.  Hepatitis A vaccine.** / Consult your health care provider.  Hepatitis B vaccine.** / Consult your health care provider.  Haemophilus influenzae type b (Hib) vaccine.** / Consult your health care provider. **Family history and personal history of risk and conditions may change your health care provider's recommendations.   This information is not intended to replace advice given to you by your health care provider. Make  sure you discuss any questions you have with your health care provider.   Document Released: 11/08/2001 Document Revised: 10/03/2014 Document Reviewed: 02/07/2011 [employer] Interactive Patient Education [employer]

## 2016-08-01 NOTE — Progress Notes (Signed)
Male physical  Impression and Recommendations:    1. Encounter for wellness examination   2. Low platelet count- chronically   3. Need for prophylactic vaccination against Streptococcus pneumoniae (pneumococcus)   4. Counseling on health promotion and disease prevention   5. Colon cancer screening   6. Need for Tdap vaccination   7. Screening for prostate cancer     1) Anticipatory Guidance: Discussed importance of wearing a seatbelt while driving, not texting while driving;   sunscreen when outside along with skin surveillance; eating a balanced and modest diet; physical activity at least 25 minutes per day or 150 min/ week moderate to intense activity.  2) Immunizations / Screenings / Labs:  All immunizations are up-to-date per recommendations or will be updated today. Patient is due for dental and vision screens which pt will schedule independently. --->  I reviewed all recent blood work with patient.  3) Weight:  BMI meaning discussed with patient.  Discussed goal of losing 5-10% of current body weight which would improve overall feelings of well being and improve objective health data. Improve nutrient density of diet through increasing intake of fruits and vegetables and decreasing saturated fats, white flour products and refined sugars.    Gross side effects, risk and benefits, and alternatives of medications discussed with patient.  Patient is aware that all medications have potential side effects and we are unable to predict every side effect or drug-drug interaction that may occur.  Expresses verbal understanding and consents to current therapy plan and treatment regimen.  Follow-up preventative CPE in 1 year. Follow-up office visit pending lab work.  F/up sooner for chronic care management and/or prn    Orders Placed This Encounter  Procedures  . Tdap vaccine greater than or equal to 7yo IM  . Pneumococcal polysaccharide vaccine 23-valent greater than or equal to  2yo subcutaneous/IM    Low platelet count- chronically Patient has been seen and worked up by hematology oncology. Workup was negative. PLTs have been low and stable for many years.    Patient's Medications  New Prescriptions   CALCIUM CARBONATE-VITAMIN D 600-400 MG-UNIT TABLET    Take 1 tablet by mouth 2 (two) times daily.  Previous Medications   ASPIRIN 325 MG TABLET    Take 325 mg by mouth daily.     B COMPLEX VITAMINS TABLET    Take 1 tablet by mouth daily.   CALCIUM CARBONATE (OS-CAL) 600 MG TABS    Take 600 mg by mouth daily.     DIPHENHYDRAMINE (BENADRYL) 25 MG CAPSULE    Take 25 mg by mouth as needed (insomnia).   DOXAZOSIN (CARDURA) 2 MG TABLET    Take 1 tablet (2 mg total) by mouth at bedtime.   GABAPENTIN (NEURONTIN) 300 MG CAPSULE    TAKE 1 CAPSULE BY MOUTH EVERY MORNING AND 2 CAPSULES BY MOUTH BEFORE BEDTIME   MULTIPLE VITAMIN (MULTIVITAMIN) CAPSULE    Take 1 capsule by mouth daily.     NAPROXEN SODIUM (ANAPROX) 220 MG TABLET    Take 220 mg by mouth 2 (two) times daily with a meal. Sciatic nerve pain   VITAMIN B-12 (CYANOCOBALAMIN) 1000 MCG TABLET    Take 1,000 mcg by mouth daily.     VITAMIN D, CHOLECALCIFEROL, PO    Take 1,000 Units by mouth daily.   Modified Medications   No medications on file  Discontinued Medications   No medications on file     Please see AVS handed out to  patient at the end of our visit for further patient instructions/ counseling done pertaining to today's office visit.   Subjective:    CC: CPE  HPI: Larry Harris is a 75 y.o. male who presents to [place] at [facility] today for a yearly health maintenance exam.    All recent blood work that we ordered was reviewed with patient today.  Patient was counseled on all abnormalities and we discussed dietary and lifestyle changes that could help those values (also medications when appropriate).  Extensive health counseling performed and all patient's concerns/ questions  were addressed.   Health Maintenance Summary Reviewed and updated, unless pt declines services.   Colonoscopy: done on 10\16-was normal. Performed by Dr. Henrene Pastor of [facility]. Does not need to go in the future again.  Tobacco History Reviewed: Patient quit smoking approximately 20 years ago.  Alcohol: No concerns, no excessive use  Exercise Habits: walking 1/2 mile/ day  STD concerns: none  Drug Use: None  Testicular/penile concerns: none  Cancer Family History: dad- prostate CA  Dermatologist- goes yrly--  Carloyn Manner     Wt Readings from Last 3 Encounters:  08/01/16 193 lb (87.5 kg)  06/06/16 193 lb (87.5 kg)  02/15/16 195 lb (88.5 kg)   BP Readings from Last 3 Encounters:  08/01/16 119/77  06/06/16 123/71  02/15/16 136/83   Pulse Readings from Last 3 Encounters:  08/01/16 78  06/06/16 64  02/15/16 65    Patient Active Problem List   Diagnosis Date Noted  . Low platelet count- chronically 08/01/2016  . BPH (benign prostatic hyperplasia) 02/15/2016  . Overweight (BMI 25.0-29.9) 02/15/2016  . Spinal stenosis of lumbar region 06/04/2014    Past Medical History:  Diagnosis Date  . Acute rhinitis   . Allergic rhinitis, cause unspecified   . Allergy   . BPH (benign prostatic hyperplasia)   . CAD (coronary artery disease)   . Cancer ([place])    scalp basal cell skin ca  . Cataract    bilateral cateracts removed  . Clotting disorder ([place])   . Hypercholesteremia   . Hypotension   . Neuralgia, neuritis, and radiculitis, unspecified   . Osteoarthritis   . Spinal stenosis of lumbar region 06/04/2014  . Thrombocytopenia, unspecified     Past Surgical History:  Procedure Laterality Date  . APPENDECTOMY  1958  . bilateral cateracts removed    . COLONOSCOPY    . HERNIA REPAIR  1965   x2    Family History  Problem Relation Age of Onset  . Stroke Mother 17  . Prostate cancer Father 18  . Heart failure Brother 44  . Colon cancer Neg Hx   . Esophageal cancer  Neg Hx   . Rectal cancer Neg Hx   . Stomach cancer Neg Hx     History  Drug Use No  ,  History  Alcohol Use  . 0.6 oz/week  . 1 Glasses of wine per week    Comment: occasional  ,  History  Smoking Status  . Former Smoker  . Packs/day: 1.00  . Years: 25.00  . Types: Cigarettes  . Quit date: 07/27/1994  Smokeless Tobacco  . Never Used  ,  History  Sexual Activity  . Sexual activity: Not on file    Patient's Medications  New Prescriptions   CALCIUM CARBONATE-VITAMIN D 600-400 MG-UNIT TABLET    Take 1 tablet by mouth 2 (two) times daily.  Previous Medications   ASPIRIN 325  MG TABLET    Take 325 mg by mouth daily.     B COMPLEX VITAMINS TABLET    Take 1 tablet by mouth daily.   CALCIUM CARBONATE (OS-CAL) 600 MG TABS    Take 600 mg by mouth daily.     DIPHENHYDRAMINE (BENADRYL) 25 MG CAPSULE    Take 25 mg by mouth as needed (insomnia).   DOXAZOSIN (CARDURA) 2 MG TABLET    Take 1 tablet (2 mg total) by mouth at bedtime.   GABAPENTIN (NEURONTIN) 300 MG CAPSULE    TAKE 1 CAPSULE BY MOUTH EVERY MORNING AND 2 CAPSULES BY MOUTH BEFORE BEDTIME   MULTIPLE VITAMIN (MULTIVITAMIN) CAPSULE    Take 1 capsule by mouth daily.     NAPROXEN SODIUM (ANAPROX) 220 MG TABLET    Take 220 mg by mouth 2 (two) times daily with a meal. Sciatic nerve pain   VITAMIN B-12 (CYANOCOBALAMIN) 1000 MCG TABLET    Take 1,000 mcg by mouth daily.     VITAMIN D, CHOLECALCIFEROL, PO    Take 1,000 Units by mouth daily.   Modified Medications   No medications on file  Discontinued Medications   No medications on file    Patient has no known allergies.  Review of Systems  Constitutional: Negative.  Negative for chills, diaphoresis, fever, malaise/fatigue and weight loss.  HENT: Negative.  Negative for congestion, sore throat and tinnitus.   Eyes: Negative.  Negative for blurred vision, double vision and photophobia.  Respiratory: Negative.  Negative for cough and wheezing.   Cardiovascular: Negative.  Negative  for chest pain and palpitations.  Gastrointestinal: Negative.  Negative for blood in stool, diarrhea, nausea and vomiting.  Genitourinary: Negative.  Negative for dysuria, frequency and urgency.  Musculoskeletal: Negative.  Negative for joint pain and myalgias.  Skin: Negative.  Negative for itching and rash.  Neurological: Negative.  Negative for dizziness, focal weakness, weakness and headaches.  Endo/Heme/Allergies: Negative.  Negative for environmental allergies and polydipsia. Does not bruise/bleed easily.  Psychiatric/Behavioral: Negative.  Negative for depression and memory loss. The patient is not nervous/anxious and does not have insomnia.      Objective:     Blood pressure 119/77, pulse 78, height 6' (1.829 m), weight 193 lb (87.5 kg). Body mass index is 26.18 kg/m. General Appearance:    Alert, cooperative, no distress, appears stated age  Head:    Normocephalic, without obvious abnormality, atraumatic  Eyes:    PERRL, conjunctiva/corneas clear, EOM's intact, fundi    benign, both eyes  Ears:    Normal TM's and external ear canals, both ears  Nose:   Nares normal, septum midline, mucosa normal, no drainage    or sinus tenderness  Throat:   Lips w/o lesion, mucosa moist, and tongue normal  Neck:   Supple, symmetrical, trachea midline, no adenopathy;    thyroid:  no enlargement/tenderness/nodules;   no carotid  bruit or JVD  Back:     Symmetric, no curvature, ROM normal, no CVA tenderness  Lungs:     Clear to auscultation bilaterally, respirations unlabored, no Wh/ R/ R  Chest Wall:    No tenderness or gross deformity; normal excursion   Heart:    Regular rate and rhythm, S1 and S2 normal, no murmur, rub   or gallop  Abdomen:     Soft, non-tender, bowel sounds active all four quadrants, NO   G/R/R, no masses, no organomegaly  Genitalia:    Ext genitalia: without lesion, Uncircumcised, no penile rash or  discharge, no hernias appreciated   Rectal:    Normal tone, prostate  WNL's and equal b/l, no tenderness; guaiac negative stool  Extremities:   Extremities normal, atraumatic, no cyanosis or gross edema  Pulses:   2+ and symmetric all extremities  Skin:   Warm, dry, Skin color, texture, turgor normal, no obvious rashes or lesions  M-Sk:   Ambulates * 4 w/o difficulty, no gross deformities, tone WNL  Neurologic:   CNII-XII intact, normal strength, sensation and reflexes    Throughout Psych:  No HI/SI, judgement and insight good, Euthymic mood. Full Affect.

## 2016-08-01 NOTE — Assessment & Plan Note (Signed)
Patient has been seen and worked up by hematology oncology. Workup was negative. PLTs have been low and stable for many years.

## 2016-08-22 ENCOUNTER — Telehealth: Payer: Self-pay | Admitting: Family Medicine

## 2016-08-22 DIAGNOSIS — N400 Enlarged prostate without lower urinary tract symptoms: Secondary | ICD-10-CM

## 2016-08-22 MED ORDER — DOXAZOSIN MESYLATE 2 MG PO TABS
2.0000 mg | ORAL_TABLET | Freq: Every day | ORAL | 1 refills | Status: DC
Start: 2016-08-22 — End: 2017-02-15

## 2016-08-22 NOTE — Addendum Note (Signed)
Addended by: Fonnie Mu on: 08/22/2016 02:03 PM   Modules accepted: Orders

## 2016-08-22 NOTE — Telephone Encounter (Signed)
Patient is requesting a refill of his doxazosin 2 mg and would like a 90 day supply with a few refills if possible.

## 2016-08-24 ENCOUNTER — Other Ambulatory Visit (INDEPENDENT_AMBULATORY_CARE_PROVIDER_SITE_OTHER): Payer: Medicare Other

## 2016-08-24 DIAGNOSIS — Z1211 Encounter for screening for malignant neoplasm of colon: Secondary | ICD-10-CM

## 2016-08-24 LAB — POC HEMOCCULT BLD/STL (HOME/3-CARD/SCREEN)
FECAL OCCULT BLD: NEGATIVE
FECAL OCCULT BLD: NEGATIVE
FECAL OCCULT BLD: NEGATIVE

## 2016-10-03 ENCOUNTER — Telehealth: Payer: Self-pay | Admitting: Family Medicine

## 2016-10-03 MED ORDER — GABAPENTIN 300 MG PO CAPS: 300.0000 mg | ORAL_CAPSULE | Freq: Three times a day (TID) | ORAL | 1 refills | Status: DC

## 2016-10-03 NOTE — Telephone Encounter (Signed)
Medication sent to pharmacy. Please advise patient.

## 2016-10-03 NOTE — Telephone Encounter (Signed)
Patient is requestin refill of Gabapentin 300 mg- with 90 day supply ( pt takes 3 a day)--glh

## 2016-10-03 NOTE — Addendum Note (Signed)
Addended by: Narda Rutherford on: 10/03/2016 03:18 PM   Modules accepted: Orders

## 2016-12-28 DIAGNOSIS — Z85828 Personal history of other malignant neoplasm of skin: Secondary | ICD-10-CM | POA: Diagnosis not present

## 2016-12-28 DIAGNOSIS — C4442 Squamous cell carcinoma of skin of scalp and neck: Secondary | ICD-10-CM | POA: Diagnosis not present

## 2016-12-28 DIAGNOSIS — L570 Actinic keratosis: Secondary | ICD-10-CM | POA: Diagnosis not present

## 2016-12-28 DIAGNOSIS — L821 Other seborrheic keratosis: Secondary | ICD-10-CM | POA: Diagnosis not present

## 2016-12-28 DIAGNOSIS — Z872 Personal history of diseases of the skin and subcutaneous tissue: Secondary | ICD-10-CM | POA: Diagnosis not present

## 2016-12-28 DIAGNOSIS — Z08 Encounter for follow-up examination after completed treatment for malignant neoplasm: Secondary | ICD-10-CM | POA: Diagnosis not present

## 2016-12-28 DIAGNOSIS — D0439 Carcinoma in situ of skin of other parts of face: Secondary | ICD-10-CM | POA: Diagnosis not present

## 2016-12-28 DIAGNOSIS — D225 Melanocytic nevi of trunk: Secondary | ICD-10-CM | POA: Diagnosis not present

## 2016-12-28 DIAGNOSIS — D485 Neoplasm of uncertain behavior of skin: Secondary | ICD-10-CM | POA: Diagnosis not present

## 2016-12-28 DIAGNOSIS — C4441 Basal cell carcinoma of skin of scalp and neck: Secondary | ICD-10-CM | POA: Diagnosis not present

## 2017-01-31 DIAGNOSIS — H26491 Other secondary cataract, right eye: Secondary | ICD-10-CM | POA: Diagnosis not present

## 2017-02-07 DIAGNOSIS — D0439 Carcinoma in situ of skin of other parts of face: Secondary | ICD-10-CM | POA: Diagnosis not present

## 2017-02-15 ENCOUNTER — Other Ambulatory Visit: Payer: Self-pay | Admitting: Family Medicine

## 2017-02-15 DIAGNOSIS — N400 Enlarged prostate without lower urinary tract symptoms: Secondary | ICD-10-CM

## 2017-02-16 ENCOUNTER — Telehealth: Payer: Self-pay | Admitting: Family Medicine

## 2017-02-16 NOTE — Telephone Encounter (Signed)
Left message informing patient the doxazosin was escribed 02/15/17 successfully.  He should call pharmacy and speak with someone to ensure they use the new rx.

## 2017-02-16 NOTE — Telephone Encounter (Signed)
Patient is requesting a refill of his doxazosin sent to [facility] on [address]

## 2017-05-02 ENCOUNTER — Ambulatory Visit: Payer: Medicare Other

## 2017-05-02 ENCOUNTER — Ambulatory Visit (INDEPENDENT_AMBULATORY_CARE_PROVIDER_SITE_OTHER): Payer: PPO | Admitting: Adult Health

## 2017-05-02 ENCOUNTER — Encounter: Payer: Self-pay | Admitting: Adult Health

## 2017-05-02 VITALS — BP 162/68 | HR 75 | Temp 97.6°F | Ht 72.0 in | Wt 192.4 lb

## 2017-05-02 DIAGNOSIS — S59901A Unspecified injury of right elbow, initial encounter: Secondary | ICD-10-CM | POA: Diagnosis not present

## 2017-05-02 DIAGNOSIS — M7989 Other specified soft tissue disorders: Secondary | ICD-10-CM | POA: Diagnosis not present

## 2017-05-02 NOTE — Progress Notes (Signed)
Subjective:    Patient ID: Larry Harris, male    DOB: April 03, 1941, 76 y.o.   MRN: [id number]  HPI:  Larry Harris was jumped on by a large dog while visiting his family in [place] 10 days ago.  The dog knocked him off his balance and he feel to the ground with the entirety of impact to R elbow.  He denies striking his head or LOC.  He reports swelling and "tightness sensation" in R elbow.  He denies pain or decreased mobility.  He denies numbness/tingling in R hand.  He denies previous injury to R elbow.   He takes daily ASA 325mg .  He has not been icing but has been taking daily Aleve.   Last Tdap 11/17  Patient Care Team    Relationship Specialty Notifications Start End  Mellody Dance, DO PCP - General Family Medicine  02/15/16     Patient Active Problem List   Diagnosis Date Noted  . Injury of right elbow 05/02/2017  . Low platelet count- chronically 08/01/2016  . BPH (benign prostatic hyperplasia) 02/15/2016  . Overweight (BMI 25.0-29.9) 02/15/2016  . Spinal stenosis of lumbar region 06/04/2014     Past Medical History:  Diagnosis Date  . Acute rhinitis   . Allergic rhinitis, cause unspecified   . Allergy   . BPH (benign prostatic hyperplasia)   . CAD (coronary artery disease)   . Cancer ([place])    scalp basal cell skin ca  . Cataract    bilateral cateracts removed  . Clotting disorder ([place])   . Hypercholesteremia   . Hypotension   . Neuralgia, neuritis, and radiculitis, unspecified   . Osteoarthritis   . Spinal stenosis of lumbar region 06/04/2014  . Thrombocytopenia, unspecified ([place])      Past Surgical History:  Procedure Laterality Date  . APPENDECTOMY  1958  . bilateral cateracts removed    . COLONOSCOPY    . HERNIA REPAIR  1965   x2     Family History  Problem Relation Age of Onset  . Stroke Mother 70  . Prostate cancer Father 85  . Heart failure Brother 89  . Colon cancer Neg Hx   . Esophageal cancer Neg Hx   . Rectal cancer Neg Hx   .  Stomach cancer Neg Hx      History  Drug Use No     History  Alcohol Use  . 0.6 oz/week  . 1 Glasses of wine per week    Comment: occasional     History  Smoking Status  . Former Smoker  . Packs/day: 1.00  . Years: 25.00  . Types: Cigarettes  . Quit date: 07/27/1994  Smokeless Tobacco  . Never Used     Outpatient Encounter Prescriptions as of 05/02/2017  Medication Sig  . aspirin 325 MG tablet Take 325 mg by mouth daily.    Marland Kitchen b complex vitamins tablet Take 1 tablet by mouth daily.  . calcium carbonate (OS-CAL) 600 MG TABS Take 600 mg by mouth daily.    . Calcium Carbonate-Vitamin D 600-400 MG-UNIT tablet Take 1 tablet by mouth 2 (two) times daily.  . diphenhydrAMINE (BENADRYL) 25 mg capsule Take 25 mg by mouth as needed (insomnia).  Marland Kitchen doxazosin (CARDURA) 2 MG tablet TAKE 1 TABLET (2 MG TOTAL) BY MOUTH AT BEDTIME.  Marland Kitchen gabapentin (NEURONTIN) 300 MG capsule Take 1 capsule (300 mg total) by mouth 3 (three) times daily.  . Multiple Vitamin (MULTIVITAMIN) capsule Take 1 capsule by mouth  daily.    . naproxen sodium (ANAPROX) 220 MG tablet Take 220 mg by mouth 2 (two) times daily with a meal. Sciatic nerve pain  . vitamin B-12 (CYANOCOBALAMIN) 1000 MCG tablet Take 1,000 mcg by mouth daily.    Marland Kitchen VITAMIN D, CHOLECALCIFEROL, PO Take 1,000 Units by mouth daily.    No facility-administered encounter medications on file as of 05/02/2017.     Allergies: Patient has no known allergies.  Body mass index is 26.09 kg/m.  Blood pressure (!) 162/68, pulse 75, temperature 97.6 F (36.4 C), temperature source Oral, height 6' (1.829 m), weight 192 lb 6.4 oz (87.3 kg).     Review of Systems  Constitutional: Negative for activity change, appetite change, chills, diaphoresis, fatigue, fever and unexpected weight change.  Respiratory: Negative for cough, chest tightness, shortness of breath, wheezing and stridor.   Cardiovascular: Negative for chest pain, palpitations and leg swelling.   Musculoskeletal: Positive for arthralgias, joint swelling and myalgias. Negative for back pain, gait problem, neck pain and neck stiffness.  Skin: Positive for wound. Negative for color change, pallor and rash.  Neurological: Negative for dizziness and headaches.  Hematological: Does not bruise/bleed easily.       Objective:   Physical Exam  Constitutional: He is oriented to person, place, and time. He appears well-developed and well-nourished. No distress.  HENT:  Head: Normocephalic and atraumatic.  Right Ear: External ear normal.  Left Ear: External ear normal.  Musculoskeletal: He exhibits edema. He exhibits no tenderness or deformity.       Right elbow: He exhibits swelling and effusion. He exhibits normal range of motion, no deformity and no laceration. No tenderness found.       Left elbow: He exhibits normal range of motion, no swelling, no effusion, no deformity and no laceration.  Several healing abrasions over R olecranon.  No open tissue noted.  Absolutely no pain with palpitation/ROM.  Neurological: He is alert and oriented to person, place, and time.  Skin: Skin is warm and dry. No rash noted. He is not diaphoretic. No erythema. No pallor.  Several healing abrasions over R olecranon.  No open tissue noted.   Psychiatric: He has a normal mood and affect. His behavior is normal. Judgment and thought content normal.  Nursing note and vitals reviewed.     Assessment & Plan:   1. Injury of right elbow, initial encounter     Injury of right elbow Xray results pending. Recommend applying ice to R elbow for 26mins several times daily, do not place ice directly onto skin. Recommend OTC elbow sleeve to help with swelling and any pain. Continue daily Aleve until swelling has completely resolved. If may take 4-8 weeks for injury to heal. Tdap UTD Please call clinic with any questions/concerns.    FOLLOW-UP:  Return if symptoms worsen or fail to improve.

## 2017-05-02 NOTE — Patient Instructions (Addendum)
Contusion A contusion is a deep bruise. Contusions are the result of a blunt injury to tissues and muscle fibers under the skin. The injury causes bleeding under the skin. The skin overlying the contusion may turn blue, purple, or yellow. Minor injuries will give you a painless contusion, but more severe contusions may stay painful and swollen for a few weeks. What are the causes? This condition is usually caused by a blow, trauma, or direct force to an area of the body. What are the signs or symptoms? Symptoms of this condition include:  Swelling of the injured area.  Pain and tenderness in the injured area.  Discoloration. The area may have redness and then turn blue, purple, or yellow.  How is this diagnosed? This condition is diagnosed based on a physical exam and medical history. An X-ray, CT scan, or MRI may be needed to determine if there are any associated injuries, such as broken bones (fractures). How is this treated? Specific treatment for this condition depends on what area of the body was injured. In general, the best treatment for a contusion is resting, icing, applying pressure to (compression), and elevating the injured area. This is often called the RICE strategy. Over-the-counter anti-inflammatory medicines may also be recommended for pain control. Follow these instructions at home:  Rest the injured area.  If directed, apply ice to the injured area: ? Put ice in a plastic bag. ? Place a towel between your skin and the bag. ? Leave the ice on for 20 minutes, 2-3 times per day.  If directed, apply light compression to the injured area using an elastic bandage. Make sure the bandage is not wrapped too tightly. Remove and reapply the bandage as directed by your health care provider.  If possible, raise (elevate) the injured area above the level of your heart while you are sitting or lying down.  Take over-the-counter and prescription medicines only as told by your health  care provider. Contact a health care provider if:  Your symptoms do not improve after several days of treatment.  Your symptoms get worse.  You have difficulty moving the injured area. Get help right away if:  You have severe pain.  You have numbness in a hand or foot.  Your hand or foot turns pale or cold. This information is not intended to replace advice given to you by your health care provider. Make sure you discuss any questions you have with your health care provider. Document Released: 06/22/2005 Document Revised: 01/21/2016 Document Reviewed: 01/28/2015 Elsevier Interactive Patient Education  2017 [employer].  We will call when xray results are available. Recommend applying ice to R elbow for 69mins several times daily, do not place ice directly onto skin. Recommend OTC elbow sleeve to help with swelling and any pain. Continue daily Aleve until swelling has completely resolved. If may take 4-8 weeks for injury to heal. Please call clinic with any questions/concerns. Reader!

## 2017-05-02 NOTE — Assessment & Plan Note (Addendum)
Xray results pending. Recommend applying ice to R elbow for 63mins several times daily, do not place ice directly onto skin. Recommend OTC elbow sleeve to help with swelling and any pain. Continue daily Aleve until swelling has completely resolved. If may take 4-8 weeks for injury to heal. Tdap UTD Please call clinic with any questions/concerns.

## 2017-05-04 ENCOUNTER — Ambulatory Visit: Payer: Medicare Other | Admitting: Adult Health

## 2017-06-12 ENCOUNTER — Encounter: Payer: Self-pay | Admitting: Neurology

## 2017-06-12 ENCOUNTER — Ambulatory Visit (INDEPENDENT_AMBULATORY_CARE_PROVIDER_SITE_OTHER): Payer: PPO | Admitting: Neurology

## 2017-06-12 VITALS — BP 137/77 | HR 71 | Ht 72.0 in | Wt 192.0 lb

## 2017-06-12 DIAGNOSIS — M48062 Spinal stenosis, lumbar region with neurogenic claudication: Secondary | ICD-10-CM

## 2017-06-12 NOTE — Progress Notes (Signed)
Reason for visit: Spinal stenosis  Larry Harris is an 76 y.o. male  History of present illness:  Larry Harris is a 76 year old right-handed white male with a history of lumbosacral spinal stenosis at the L4-5 level, the patient may have intermittent pain down the right leg. Overall, he has been very stable with his symptoms, he has moved to a town home that does not require a lot of yard work, and for this reason he has done a lot better with his back pain. He walks 1/2 mile a day, he does not engage in any other physical activity, however. The patient reports no difficulty controlling the bowels or the bladder, the denies any significant weakness in the legs, he has had one fall associated with being pushed over by a large dog but otherwise he has not fallen because of balance issues. He takes Aleve in the morning and he takes gabapentin 300 mg 3 times daily. This does seem to help him most days with the pain. He has not required an epidural steroid injection recently. He returns for an evaluation.  Past Medical History:  Diagnosis Date  . Acute rhinitis   . Allergic rhinitis, cause unspecified   . Allergy   . BPH (benign prostatic hyperplasia)   . CAD (coronary artery disease)   . Cancer ([place])    scalp basal cell skin ca  . Cataract    bilateral cateracts removed  . Clotting disorder ([place])   . Hypercholesteremia   . Hypotension   . Neuralgia, neuritis, and radiculitis, unspecified   . Osteoarthritis   . Spinal stenosis of lumbar region 06/04/2014  . Thrombocytopenia, unspecified ([place])     Past Surgical History:  Procedure Laterality Date  . APPENDECTOMY  1958  . bilateral cateracts removed    . COLONOSCOPY    . HERNIA REPAIR  1965   x2    Family History  Problem Relation Age of Onset  . Stroke Mother 74  . Prostate cancer Father 60  . Heart failure Brother 47  . Colon cancer Neg Hx   . Esophageal cancer Neg Hx   . Rectal cancer Neg Hx   . Stomach cancer Neg  Hx     Social history:  reports that he quit smoking about 22 years ago. His smoking use included Cigarettes. He has a 25.00 pack-year smoking history. He has never used smokeless tobacco. He reports that he drinks about 0.6 oz of alcohol per week . He reports that he does not use drugs.   No Known Allergies  Medications:  Prior to Admission medications   Medication Sig Start Date End Date Taking? Authorizing Provider  aspirin 325 MG tablet Take 325 mg by mouth daily.     Yes [provider]  b complex vitamins tablet Take 1 tablet by mouth daily.   Yes [provider]  calcium carbonate (OS-CAL) 600 MG TABS Take 600 mg by mouth daily.     Yes [provider]  diphenhydrAMINE (BENADRYL) 25 mg capsule Take 25 mg by mouth as needed (insomnia).   Yes [provider]  doxazosin (CARDURA) 2 MG tablet TAKE 1 TABLET (2 MG TOTAL) BY MOUTH AT BEDTIME. 02/15/17  Yes Opalski, Deborah, DO  gabapentin (NEURONTIN) 300 MG capsule Take 1 capsule (300 mg total) by mouth 3 (three) times daily. 10/03/16  Yes Opalski, Neoma Laming, DO  Multiple Vitamin (MULTIVITAMIN) capsule Take 1 capsule by mouth daily.     Yes [provider]  naproxen sodium (ANAPROX) 220 MG tablet Take 220 mg by mouth 2 (two) times daily with a meal. Sciatic nerve pain   Yes [provider]  vitamin B-12 (CYANOCOBALAMIN) 1000 MCG tablet Take 1,000 mcg by mouth daily.     Yes [provider]  VITAMIN D, CHOLECALCIFEROL, PO Take 1,000 Units by mouth daily.    Yes [provider]    ROS:  Out of a complete 14 system review of symptoms, the patient complains only of the following symptoms, and all other reviewed systems are negative.  Frequency of urination Muscle cramps Bruising easily  Blood pressure 137/77, pulse 71, height 6' (1.829 m), weight 192 lb (87.1 kg).  Physical Exam  General: The patient is alert and cooperative at the time of the examination.  Skin: No  significant peripheral edema is noted.   Neurologic Exam  Mental status: The patient is alert and oriented x 3 at the time of the examination. The patient has apparent normal recent and remote memory, with an apparently normal attention span and concentration ability.   Cranial nerves: Facial symmetry is present. Speech is normal, no aphasia or dysarthria is noted. Extraocular movements are full. Visual fields are full.  Motor: The patient has good strength in all 4 extremities.  Sensory examination: Soft touch sensation is symmetric on the face, arms, and legs.  Coordination: The patient has good finger-nose-finger and heel-to-shin bilaterally.  Gait and station: The patient has a slightly wide-based gait. The patient can walk independently. Tandem gait is unsteady. The patient is unable to walk on the toes, he can walk on heels bilaterally. Romberg is negative. No drift is seen.  Reflexes: Deep tendon reflexes are symmetric.   Assessment/Plan:  1. Lumbosacral spinal stenosis, L4-5 level  The patient does have some weakness with standing on the toes suggesting a possible bilateral S1 nerve root involvement. This is a chronic stable finding. Overall, his pain level is well controlled. At this point, he is getting no medications or therapy through this office. I will have him contact us on an as-needed basis, no revisit will be scheduled at this time.   Jill Alexanders MD 06/12/2017 9:53 AM  [facility] [address] [place], [place] [address]  Phone [phone] Fax [phone]

## 2017-06-25 ENCOUNTER — Other Ambulatory Visit: Payer: Self-pay | Admitting: Family Medicine

## 2017-06-29 DIAGNOSIS — L820 Inflamed seborrheic keratosis: Secondary | ICD-10-CM | POA: Diagnosis not present

## 2017-06-29 DIAGNOSIS — L720 Epidermal cyst: Secondary | ICD-10-CM | POA: Diagnosis not present

## 2017-06-29 DIAGNOSIS — D225 Melanocytic nevi of trunk: Secondary | ICD-10-CM | POA: Diagnosis not present

## 2017-06-29 DIAGNOSIS — L570 Actinic keratosis: Secondary | ICD-10-CM | POA: Diagnosis not present

## 2017-06-29 DIAGNOSIS — D485 Neoplasm of uncertain behavior of skin: Secondary | ICD-10-CM | POA: Diagnosis not present

## 2017-06-29 DIAGNOSIS — Z08 Encounter for follow-up examination after completed treatment for malignant neoplasm: Secondary | ICD-10-CM | POA: Diagnosis not present

## 2017-06-29 DIAGNOSIS — Z85828 Personal history of other malignant neoplasm of skin: Secondary | ICD-10-CM | POA: Diagnosis not present

## 2017-06-29 DIAGNOSIS — L821 Other seborrheic keratosis: Secondary | ICD-10-CM | POA: Diagnosis not present

## 2017-07-06 ENCOUNTER — Telehealth: Payer: Self-pay | Admitting: Family Medicine

## 2017-07-06 NOTE — Telephone Encounter (Signed)
Patient is requesting a refill of his gabapentin sent to [facility]

## 2017-07-10 NOTE — Telephone Encounter (Signed)
Medication was sent to pharmacy. MPulliam, CMA/RT(R)

## 2017-08-09 ENCOUNTER — Other Ambulatory Visit (INDEPENDENT_AMBULATORY_CARE_PROVIDER_SITE_OTHER): Payer: PPO

## 2017-08-09 ENCOUNTER — Other Ambulatory Visit: Payer: Self-pay

## 2017-08-09 DIAGNOSIS — Z8042 Family history of malignant neoplasm of prostate: Secondary | ICD-10-CM

## 2017-08-09 DIAGNOSIS — E663 Overweight: Secondary | ICD-10-CM | POA: Diagnosis not present

## 2017-08-09 DIAGNOSIS — Z0000 Encounter for general adult medical examination without abnormal findings: Secondary | ICD-10-CM

## 2017-08-09 DIAGNOSIS — R7989 Other specified abnormal findings of blood chemistry: Secondary | ICD-10-CM | POA: Diagnosis not present

## 2017-08-09 NOTE — Addendum Note (Signed)
Addended by: Fonnie Mu on: 08/09/2017 08:36 AM   Modules accepted: Orders

## 2017-08-10 ENCOUNTER — Other Ambulatory Visit: Payer: Self-pay | Admitting: Family Medicine

## 2017-08-10 DIAGNOSIS — N400 Enlarged prostate without lower urinary tract symptoms: Secondary | ICD-10-CM

## 2017-08-10 LAB — CBC WITH DIFFERENTIAL/PLATELET
BASOS: 1 %
Basophils Absolute: 0.1 10*3/uL (ref 0.0–0.2)
EOS (ABSOLUTE): 0.3 10*3/uL (ref 0.0–0.4)
EOS: 4 %
HEMATOCRIT: 44.9 % (ref 37.5–51.0)
HEMOGLOBIN: 14.6 g/dL (ref 13.0–17.7)
IMMATURE GRANS (ABS): 0 10*3/uL (ref 0.0–0.1)
IMMATURE GRANULOCYTES: 0 %
LYMPHS: 25 %
Lymphocytes Absolute: 1.5 10*3/uL (ref 0.7–3.1)
MCH: 31.1 pg (ref 26.6–33.0)
MCHC: 32.5 g/dL (ref 31.5–35.7)
MCV: 96 fL (ref 79–97)
Monocytes Absolute: 0.6 10*3/uL (ref 0.1–0.9)
Monocytes: 10 %
NEUTROS ABS: 3.7 10*3/uL (ref 1.4–7.0)
Neutrophils: 60 %
PLATELETS: 127 10*3/uL — AB (ref 150–379)
RBC: 4.69 x10E6/uL (ref 4.14–5.80)
RDW: 14.4 % (ref 12.3–15.4)
WBC: 6.2 10*3/uL (ref 3.4–10.8)

## 2017-08-10 LAB — COMPREHENSIVE METABOLIC PANEL
A/G RATIO: 2.2 (ref 1.2–2.2)
ALT: 15 IU/L (ref 0–44)
AST: 15 IU/L (ref 0–40)
Albumin: 3.5 g/dL (ref 3.5–4.8)
Alkaline Phosphatase: 55 IU/L (ref 39–117)
BUN/Creatinine Ratio: 17 (ref 10–24)
BUN: 15 mg/dL (ref 8–27)
Bilirubin Total: 0.4 mg/dL (ref 0.0–1.2)
CALCIUM: 8.5 mg/dL — AB (ref 8.6–10.2)
CO2: 27 mmol/L (ref 20–29)
CREATININE: 0.86 mg/dL (ref 0.76–1.27)
Chloride: 104 mmol/L (ref 96–106)
GFR, EST AFRICAN AMERICAN: 97 mL/min/{1.73_m2} (ref >59)
GFR, EST NON AFRICAN AMERICAN: 84 mL/min/{1.73_m2} (ref >59)
GLOBULIN, TOTAL: 1.6 g/dL (ref 1.5–4.5)
Glucose: 94 mg/dL (ref 65–99)
POTASSIUM: 4.5 mmol/L (ref 3.5–5.2)
Sodium: 143 mmol/L (ref 134–144)
TOTAL PROTEIN: 5.1 g/dL — AB (ref 6.0–8.5)

## 2017-08-10 LAB — LIPID PANEL
Chol/HDL Ratio: 3.3 ratio (ref 0.0–5.0)
Cholesterol, Total: 173 mg/dL (ref 100–199)
HDL: 52 mg/dL (ref >39)
LDL Calculated: 109 mg/dL — ABNORMAL HIGH (ref 0–99)
Triglycerides: 58 mg/dL (ref 0–149)
VLDL CHOLESTEROL CAL: 12 mg/dL (ref 5–40)

## 2017-08-10 LAB — HEMOGLOBIN A1C
ESTIMATED AVERAGE GLUCOSE: 111 mg/dL
Hgb A1c MFr Bld: 5.5 % (ref 4.8–5.6)

## 2017-08-10 LAB — TSH: TSH: 2.79 u[IU]/mL (ref 0.450–4.500)

## 2017-08-10 LAB — PSA: Prostate Specific Ag, Serum: 3 ng/mL (ref 0.0–4.0)

## 2017-08-16 ENCOUNTER — Ambulatory Visit (INDEPENDENT_AMBULATORY_CARE_PROVIDER_SITE_OTHER): Payer: Medicare Other | Admitting: Family Medicine

## 2017-08-16 ENCOUNTER — Encounter: Payer: Self-pay | Admitting: Family Medicine

## 2017-08-16 VITALS — BP 144/81 | HR 75 | Ht 70.25 in | Wt 196.9 lb

## 2017-08-16 DIAGNOSIS — Z23 Encounter for immunization: Secondary | ICD-10-CM

## 2017-08-16 DIAGNOSIS — Z9181 History of falling: Secondary | ICD-10-CM

## 2017-08-16 DIAGNOSIS — Z0001 Encounter for general adult medical examination with abnormal findings: Secondary | ICD-10-CM

## 2017-08-16 DIAGNOSIS — Z1211 Encounter for screening for malignant neoplasm of colon: Secondary | ICD-10-CM

## 2017-08-16 DIAGNOSIS — T148XXA Other injury of unspecified body region, initial encounter: Secondary | ICD-10-CM

## 2017-08-16 DIAGNOSIS — K648 Other hemorrhoids: Secondary | ICD-10-CM

## 2017-08-16 LAB — IFOBT (OCCULT BLOOD): IMMUNOLOGICAL FECAL OCCULT BLOOD TEST: POSITIVE

## 2017-08-16 MED ORDER — PRAMOXINE HCL 1 % RE FOAM: 1.0000 "application " | Freq: Three times a day (TID) | RECTAL | 0 refills | Status: DC | PRN

## 2017-08-16 MED ORDER — PNEUMOCOCCAL 13-VAL CONJ VACC IM SUSP: 0.5000 mL | INTRAMUSCULAR | 0 refills | Status: AC

## 2017-08-16 NOTE — Progress Notes (Signed)
Male physical  Impression and Recommendations:    1. Encounter for general adult medical examination with abnormal findings   2. Need for pneumococcal vaccine   3. Contusion of muscle-right upper extremity.   4. Status post fall-tripped a couple weeks ago fell on right elbow/ arm   5. Internal hemorrhoid, bleeding      No orders of the defined types were placed in this encounter.    No problem-specific Assessment & Plan notes found for this encounter.  Depression screen [facility] 2/9 08/16/2017 07/25/2016  Decreased Interest 0 0  Down, Depressed, Hopeless 0 0  PHQ - 2 Score 0 0  Altered sleeping 0 -  Tired, decreased energy 1 -  Change in appetite 0 -  Feeling bad or failure about yourself  0 -  Trouble concentrating 0 -  Moving slowly or fidgety/restless 0 -  Suicidal thoughts 0 -  PHQ-9 Score 1 -  Difficult doing work/chores Not difficult at all -   Fall Risk  08/16/2017 07/25/2016  Falls in the past year? Yes No  Number falls in past yr: 1 -  Injury with Fall? No -  Follow up Falls evaluation completed -   6CIT Screen 08/16/2017  What Year? 0 points  What month? 0 points  What time? 0 points  Count back from 20 0 points  Months in reverse 0 points  Repeat phrase 0 points  Total Score 0    Activities of Daily Living In your present state of health, do you have difficulty performing the following activities?  1- Driving - no 2- Managing money - no 3- Feeding yourself - no 4- Getting from the bed to the chair - no 5- Climbing a flight of stairs - no 6- Preparing food and eating - no 7- Bathing or showering - no 8- Getting dressed - no 9- Getting to the toilet - no 10- Using the toilet - no 11- Moving around from place to place - no Current Exercise Habits: Home exercise routine, Type of exercise: walking, Frequency (Times/Week): 7, Intensity: Moderate Exercise limited by: Other - see comments;None identified     Medication List         Accurate  as of 08/16/17  9:09 AM. Always use your most recent med list.            aspirin 325 MG tablet   b complex vitamins tablet   calcium carbonate 600 MG Tabs tablet Commonly known as:  OS-CAL   diphenhydrAMINE 25 mg capsule Commonly known as:  BENADRYL   doxazosin 2 MG tablet Commonly known as:  CARDURA TAKE 1 TABLET (2 MG TOTAL) BY MOUTH AT BEDTIME.   * gabapentin 300 MG capsule Commonly known as:  NEURONTIN Take 1 capsule (300 mg total) by mouth 3 (three) times daily.   * gabapentin 300 MG capsule Commonly known as:  NEURONTIN TAKE 1 CAPSULE BY MOUTH EVERY MORNING AND 2 CAPSULES BY MOUTH BEFORE BEDTIME   multivitamin capsule   naproxen sodium 220 MG tablet Commonly known as:  ALEVE   pneumococcal 13-valent conjugate vaccine Susp injection Commonly known as:  PREVNAR 13 Inject 0.5 mLs into the muscle tomorrow at 10 am for 1 dose.   pramoxine 1 % foam Commonly known as:  PROCTOFOAM Place 1 application rectally 3 (three) times daily as needed for anal itching.   vitamin B-12 1000 MCG tablet Commonly known as:  CYANOCOBALAMIN   VITAMIN D (CHOLECALCIFEROL) PO       *  This list has 2 medication(s) that are the same as other medications prescribed for you. Read the directions carefully, and ask your doctor or other care provider to review them with you.            Where to Get Your Medications     These medications [facility] to [id number] - [place], [place] - [address]  [address] [place] [address]    Phone:  [phone]  pneumococcal 13-valent conjugate vaccine Susp injection pramoxine 1 % foam      Please see AVS handed out to patient at the end of our visit for further patient instructions/ counseling done pertaining to today's office visit.  1) Anticipatory Guidance: Discussed importance of wearing a seatbelt while driving, not texting while driving;   sunscreen when outside along with skin surveillance; eating a balanced  and modest diet; physical activity at least 25 minutes per day or 150 min/ week moderate to intense activity.  2) Immunizations / Screenings / Labs:   All immunizations are up-to-date per recommendations or will be updated today. Patient is due for dental and vision screens which pt will schedule independently. Will obtain CBC, CMP, HgA1c, Lipid panel, TSH and vit D when fasting, if not already done recently.   3) Weight:  BMI meaning discussed with patient.  Discussed goal of losing 5-10% of current body weight which would improve overall feelings of well being and improve objective health data. Improve nutrient density of diet through increasing intake of fruits and vegetables and decreasing saturated fats, white flour products and refined sugars.    Gross side effects, risk and benefits, and alternatives of medications discussed with patient.  Patient is aware that all medications have potential side effects and we are unable to predict every side effect or drug-drug interaction that may occur.  Expresses verbal understanding and consents to current therapy plan and treatment regimen.  Follow-up preventative CPE in 1 year. Follow-up office visit pending lab work.  F/up sooner for chronic care management and/or prn    Subjective:    CC: CPE  HPI: Larry Harris is a 76 y.o. male who presents to [facility] today for a yearly health maintenance exam.     Health Maintenance Summary Reviewed and updated, unless pt declines services.  Aspirin: administering 81 mg daily Colonoscopy:     (Unnecessary secondary to < 20 or > 63 years old.) Tdap: Up to date: needs TD  Pneumovax/PPSV23:  Up to date: see Immunizations. Prevnar 13/PCV13:    To be administered at this encounter. Zostavax:    Postponed. Tobacco History Reviewed:    CT scan for screening lung CA:    Abdominal Ultrasound:     ( Unnecessary secondary to < 46 or > 43 years old) Alcohol:    No concerns,  no excessive use Exercise Habits:    STD concerns:   none Drug Use:   None Birth control method:   n/a Testicular/penile concerns:        Health Maintenance  Topic Date Due   PNA vac Low Risk Adult (2 of 2 - PCV13) 08/01/2017   TETANUS/TDAP  08/01/2026   INFLUENZA VACCINE  Completed      Wt Readings from Last 3 Encounters:  08/16/17 196 lb 14.4 oz (89.3 kg)  06/12/17 192 lb (87.1 kg)  05/02/17 192 lb 6.4 oz (87.3 kg)   BP Readings from Last 3 Encounters:  08/16/17 (!) 160/92  06/12/17  137/77  05/02/17 (!) 162/68   Pulse Readings from Last 3 Encounters:  08/16/17 75  06/12/17 71  05/02/17 75    Patient Active Problem List   Diagnosis Date Noted   Injury of right elbow 05/02/2017   Low platelet count- chronically 08/01/2016   BPH (benign prostatic hyperplasia) 02/15/2016   Overweight (BMI 25.0-29.9) 02/15/2016   Spinal stenosis of lumbar region 06/04/2014    Past Medical History:  Diagnosis Date   Acute rhinitis    Allergic rhinitis, cause unspecified    Allergy    BPH (benign prostatic hyperplasia)    CAD (coronary artery disease)    Cancer ([facility])    scalp basal cell skin ca   Cataract    bilateral cateracts removed   Clotting disorder ([facility])    Hypercholesteremia    Hypotension    Neuralgia, neuritis, and radiculitis, unspecified    Osteoarthritis    Spinal stenosis of lumbar region 06/04/2014   Thrombocytopenia, unspecified ([facility])     Past Surgical History:  Procedure Laterality Date   APPENDECTOMY  1958   bilateral cateracts removed     COLONOSCOPY     HERNIA REPAIR  1965   x2    Family History  Problem Relation Age of Onset   Stroke Mother 38   Prostate cancer Father 79   Heart failure Brother 19   Colon cancer Neg Hx    Esophageal cancer Neg Hx    Rectal cancer Neg Hx    Stomach cancer Neg Hx     Social History   Substance and Sexual Activity  Drug Use No  ,  Social History   Substance and Sexual Activity  Alcohol Use Yes    Alcohol/week: 0.6 oz   Types: 1 Glasses of wine per week   Comment: occasional  ,  Social History   Tobacco Use  Smoking Status Former Smoker   Packs/day: 1.00   Years: 25.00   Pack years: 25.00   Types: Cigarettes   Last attempt to quit: 07/27/1994   Years since quitting: 23.0  Smokeless Tobacco Never Used  ,  Social History   Substance and Sexual Activity  Sexual Activity Not on file      Medication List         Accurate as of 08/16/17  9:09 AM. Always use your most recent med list.            aspirin 325 MG tablet   b complex vitamins tablet   calcium carbonate 600 MG Tabs tablet Commonly known as:  OS-CAL   diphenhydrAMINE 25 mg capsule Commonly known as:  BENADRYL   doxazosin 2 MG tablet Commonly known as:  CARDURA TAKE 1 TABLET (2 MG TOTAL) BY MOUTH AT BEDTIME.   * gabapentin 300 MG capsule Commonly known as:  NEURONTIN Take 1 capsule (300 mg total) by mouth 3 (three) times daily.   * gabapentin 300 MG capsule Commonly known as:  NEURONTIN TAKE 1 CAPSULE BY MOUTH EVERY MORNING AND 2 CAPSULES BY MOUTH BEFORE BEDTIME   multivitamin capsule   naproxen sodium 220 MG tablet Commonly known as:  ALEVE   pneumococcal 13-valent conjugate vaccine Susp injection Commonly known as:  PREVNAR 13 Inject 0.5 mLs into the muscle tomorrow at 10 am for 1 dose.   pramoxine 1 % foam Commonly known as:  PROCTOFOAM Place 1 application rectally 3 (three) times daily as needed for anal itching.   vitamin B-12 1000 MCG tablet Commonly known as:  CYANOCOBALAMIN  VITAMIN D (CHOLECALCIFEROL) PO       * This list has 2 medication(s) that are the same as other medications prescribed for you. Read the directions carefully, and ask your doctor or other care provider to review them with you.            Where to Get Your Medications     [id number] [place], [place] - [address]  [address] [place]  [address]    Phone:  [phone]  pneumococcal 13-valent conjugate vaccine Susp injection pramoxine 1 % foam     Patient has no known allergies.  Review of Systems: General:   Denies fever, chills, unexplained weight loss.  Optho/Auditory:   Denies visual changes, blurred vision/LOV Respiratory:   Denies SOB, DOE more than baseline levels.   Cardiovascular:   Denies chest pain, palpitations, new onset peripheral edema  Gastrointestinal:   Denies nausea, vomiting, diarrhea.  Genitourinary: Denies dysuria, freq/ urgency, flank pain or discharge from genitals.  Endocrine:     Denies hot or cold intolerance, polyuria, polydipsia. Musculoskeletal:   Denies unexplained myalgias, joint swelling, unexplained arthralgias, gait problems.  Skin:  Denies rash, suspicious lesions Neurological:     Denies dizziness, unexplained weakness, numbness  Psychiatric/Behavioral:   Denies mood changes, suicidal or homicidal ideations, hallucinations    Objective:     Blood pressure (!) 160/92, pulse 75, height 5' 10.25" (1.784 m), weight 196 lb 14.4 oz (89.3 kg). Body mass index is 28.05 kg/m. General Appearance:    Alert, cooperative, no distress, appears stated age  Head:    Normocephalic, without obvious abnormality, atraumatic  Eyes:    PERRL, conjunctiva/corneas clear, EOM's intact, fundi    benign, both eyes  Ears:    Normal TM's and external ear canals, both ears  Nose:   Nares normal, septum midline, mucosa normal, no drainage    or sinus tenderness  Throat:   Lips w/o lesion, mucosa moist, and tongue normal; teeth and   gums normal  Neck:   Supple, symmetrical, trachea midline, no adenopathy;    thyroid:  no enlargement/tenderness/nodules; no carotid   bruit or JVD  Back:     Symmetric, no curvature, ROM normal, no CVA tenderness  Lungs:     Clear to auscultation bilaterally, respirations unlabored, no       Wh/ R/ R  Chest Wall:    No tenderness or gross deformity; normal excursion    Heart:    Regular rate and rhythm, S1 and S2 normal, no murmur, rub   or gallop  Abdomen:     Soft, non-tender, bowel sounds active all four quadrants, NO   G/R/R, no masses, no organomegaly  Genitalia:    Ext genitalia: without lesion, no penile rash or discharge, no hernias appreciated   Rectal:    Normal tone, prostate WNL's and equal b/l, no tenderness; guaiac negative stool  Extremities:   Extremities normal, atraumatic, no cyanosis or gross edema  Pulses:   2+ and symmetric all extremities  Skin:   Warm, dry, Skin color, texture, turgor normal, no obvious rashes or lesions  M-Sk:   Ambulates * 4 w/o difficulty, no gross deformities, tone WNL  Neurologic:   CNII-XII intact, normal strength, sensation and reflexes    Throughout Psych:  No HI/SI, judgement and insight good, Euthymic mood. Full Affect.

## 2017-08-16 NOTE — Patient Instructions (Addendum)
For your muscle contusion right upper extremity use ice cup massage to the muscle to help break up the spasms then heat.  To the elbow and shoulder please use ice 15 minutes 3-4 times per day.  For his hemorrhoids he will use the Proctofoam as needed for inflammation, itching or pain.  Please use as directed.  It is very important you drink adequate amounts of water which is one half your weight in ounces of water per day, take stool softeners to make sure your stool is soft to prevent worsening of the hemorrhoids.  Try to avoid straining.  Please let me know if this does not improve over the next couple of weeks.    Preventive Care for Adults, Male A healthy lifestyle and preventive care can promote health and wellness. Preventive health guidelines for men include the following key practices:  A routine yearly physical is a good way to check with your health care provider about your health and preventative screening. It is a chance to share any concerns and updates on your health and to receive a thorough exam.  Visit your dentist for a routine exam and preventative care every 6 months. Brush your teeth twice a day and floss once a day. Good oral hygiene prevents tooth decay and gum disease.  The frequency of eye exams is based on your age, health, family medical history, use of contact lenses, and other factors. Follow your health care provider's recommendations for frequency of eye exams.  Eat a healthy diet. Foods such as vegetables, fruits, whole grains, low-fat dairy products, and lean protein foods contain the nutrients you need without too many calories. Decrease your intake of foods high in solid fats, added sugars, and salt. Eat the right amount of calories for you.Get information about a proper diet from your health care provider, if necessary.  Regular physical exercise is one of the most important things you can do for your health. Most adults should get at least 150 minutes of  moderate-intensity exercise (any activity that increases your heart rate and causes you to sweat) each week. In addition, most adults need muscle-strengthening exercises on 2 or more days a week.  Maintain a healthy weight. The body mass index (BMI) is a screening tool to identify possible weight problems. It provides an estimate of body fat based on height and weight. Your health care provider can find your BMI and can help you achieve or maintain a healthy weight.For adults 20 years and older:  A BMI below 18.5 is considered underweight.  A BMI of 18.5 to 24.9 is normal.  A BMI of 25 to 29.9 is considered overweight.  A BMI of 30 and above is considered obese.  Maintain normal blood lipids and cholesterol levels by exercising and minimizing your intake of saturated fat. Eat a balanced diet with plenty of fruit and vegetables. Blood tests for lipids and cholesterol should begin at age 17 and be repeated every 5 years. If your lipid or cholesterol levels are high, you are over 50, or you are at high risk for heart disease, you may need your cholesterol levels checked more frequently.Ongoing high lipid and cholesterol levels should be treated with medicines if diet and exercise are not working.  If you smoke, find out from your health care provider how to quit. If you do not use tobacco, do not start.  Lung cancer screening is recommended for adults aged 30-80 years who are at high risk for developing lung cancer because of  a history of smoking. A yearly low-dose CT scan of the lungs is recommended for people who have at least a 30-pack-year history of smoking and are a current smoker or have quit within the past 15 years. A pack year of smoking is smoking an average of 1 pack of cigarettes a day for 1 year (for example: 1 pack a day for 30 years or 2 packs a day for 15 years). Yearly screening should continue until the smoker has stopped smoking for at least 15 years. Yearly screening should be  stopped for people who develop a health problem that would prevent them from having lung cancer treatment.  If you choose to drink alcohol, do not have more than 2 drinks per day. One drink is considered to be 12 ounces (355 mL) of beer, 5 ounces (148 mL) of wine, or 1.5 ounces (44 mL) of liquor.  Avoid use of street drugs. Do not share needles with anyone. Ask for help if you need support or instructions about stopping the use of drugs.  High blood pressure causes heart disease and increases the risk of stroke. Your blood pressure should be checked at least every 1-2 years. Ongoing high blood pressure should be treated with medicines, if weight loss and exercise are not effective.  If you are 34-79 years old, ask your health care provider if you should take aspirin to prevent heart disease.  Diabetes screening is done by taking a blood sample to check your blood glucose level after you have not eaten for a certain period of time (fasting). If you are not overweight and you do not have risk factors for diabetes, you should be screened once every 3 years starting at age 58. If you are overweight or obese and you are 45-90 years of age, you should be screened for diabetes every year as part of your cardiovascular risk assessment.  Colorectal cancer can be detected and often prevented. Most routine colorectal cancer screening begins at the age of 67 and continues through age 65. However, your health care provider may recommend screening at an earlier age if you have risk factors for colon cancer. On a yearly basis, your health care provider may provide home test kits to check for hidden blood in the stool. Use of a small camera at the end of a tube to directly examine the colon (sigmoidoscopy or colonoscopy) can detect the earliest forms of colorectal cancer. Talk to your health care provider about this at age 35, when routine screening begins. Direct exam of the colon should be repeated every 5-10 years  through age 75, unless early forms of precancerous polyps or small growths are found.  People who are at an increased risk for hepatitis B should be screened for this virus. You are considered at high risk for hepatitis B if:  You were born in a country where hepatitis B occurs often. Talk with your health care provider about which countries are considered high risk.  Your parents were born in a high-risk country and you have not received a shot to protect against hepatitis B (hepatitis B vaccine).  You have HIV or AIDS.  You use needles to inject street drugs.  You live with, or have sex with, someone who has hepatitis B.  You are a man who has sex with other men (MSM).  You get hemodialysis treatment.  You take certain medicines for conditions such as cancer, organ transplantation, and autoimmune conditions.  Hepatitis C blood testing is recommended for  all people born from [birth year] through 1965 and any individual with known risks for hepatitis C.  Practice safe sex. Use condoms and avoid high-risk sexual practices to reduce the spread of sexually transmitted infections (STIs). STIs include gonorrhea, chlamydia, syphilis, trichomonas, herpes, HPV, and human immunodeficiency virus (HIV). Herpes, HIV, and HPV are viral illnesses that have no cure. They can result in disability, cancer, and death.  If you are a man who has sex with other men, you should be screened at least once per year for:  HIV.  Urethral, rectal, and pharyngeal infection of gonorrhea, chlamydia, or both.  If you are at risk of being infected with HIV, it is recommended that you take a prescription medicine daily to prevent HIV infection. This is called preexposure prophylaxis (PrEP). You are considered at risk if:  You are a man who has sex with other men (MSM) and have other risk factors.  You are a heterosexual man, are sexually active, and are at increased risk for HIV infection.  You take drugs by  injection.  You are sexually active with a partner who has HIV.  Talk with your health care provider about whether you are at high risk of being infected with HIV. If you choose to begin PrEP, you should first be tested for HIV. You should then be tested every 3 months for as long as you are taking PrEP.  A one-time screening for abdominal aortic aneurysm (AAA) and surgical repair of large AAAs by ultrasound are recommended for men ages 110 to 70 years who are current or former smokers.  Healthy men should no longer receive prostate-specific antigen (PSA) blood tests as part of routine cancer screening. Talk with your health care provider about prostate cancer screening.  Testicular cancer screening is not recommended for adult males who have no symptoms. Screening includes self-exam, a health care provider exam, and other screening tests. Consult with your health care provider about any symptoms you have or any concerns you have about testicular cancer.  Use sunscreen. Apply sunscreen liberally and repeatedly throughout the day. You should seek shade when your shadow is shorter than you. Protect yourself by wearing long sleeves, pants, a wide-brimmed hat, and sunglasses year round, whenever you are outdoors.  Once a month, do a whole-body skin exam, using a mirror to look at the skin on your back. Tell your health care provider about new moles, moles that have irregular borders, moles that are larger than a pencil eraser, or moles that have changed in shape or color.  Stay current with required vaccines (immunizations).  Influenza vaccine. All adults should be immunized every year.  Tetanus, diphtheria, and acellular pertussis (Td, Tdap) vaccine. An adult who has not previously received Tdap or who does not know his vaccine status should receive 1 dose of Tdap. This initial dose should be followed by tetanus and diphtheria toxoids (Td) booster doses every 10 years. Adults with an unknown or  incomplete history of completing a 3-dose immunization series with Td-containing vaccines should begin or complete a primary immunization series including a Tdap dose. Adults should receive a Td booster every 10 years.  Varicella vaccine. An adult without evidence of immunity to varicella should receive 2 doses or a second dose if he has previously received 1 dose.  Human papillomavirus (HPV) vaccine. Males aged 11-21 years who have not received the vaccine previously should receive the 3-dose series. Males aged 22-26 years may be immunized. Immunization is recommended through the age of 55  years for any male who has sex with males and did not get any or all doses earlier. Immunization is recommended for any person with an immunocompromised condition through the age of 42 years if he did not get any or all doses earlier. During the 3-dose series, the second dose should be obtained 4-8 weeks after the first dose. The third dose should be obtained 24 weeks after the first dose and 16 weeks after the second dose.  Zoster vaccine. One dose is recommended for adults aged 26 years or older unless certain conditions are present.  Measles, mumps, and rubella (MMR) vaccine. Adults born before [birth year] generally are considered immune to measles and mumps. Adults born in [birth year] or later should have 1 or more doses of MMR vaccine unless there is a contraindication to the vaccine or there is laboratory evidence of immunity to each of the three diseases. A routine second dose of MMR vaccine should be obtained at least 28 days after the first dose for students attending postsecondary schools, health care workers, or international travelers. People who received inactivated measles vaccine or an unknown type of measles vaccine during 1963-1967 should receive 2 doses of MMR vaccine. People who received inactivated mumps vaccine or an unknown type of mumps vaccine before 1979 and are at high risk for mumps infection should consider  immunization with 2 doses of MMR vaccine. Unvaccinated health care workers born before [birth year] who lack laboratory evidence of measles, mumps, or rubella immunity or laboratory confirmation of disease should consider measles and mumps immunization with 2 doses of MMR vaccine or rubella immunization with 1 dose of MMR vaccine.  Pneumococcal 13-valent conjugate (PCV13) vaccine. When indicated, a person who is uncertain of his immunization history and has no record of immunization should receive the PCV13 vaccine. All adults 11 years of age and older should receive this vaccine. An adult aged 27 years or older who has certain medical conditions and has not been previously immunized should receive 1 dose of PCV13 vaccine. This PCV13 should be followed with a dose of pneumococcal polysaccharide (PPSV23) vaccine. Adults who are at high risk for pneumococcal disease should obtain the PPSV23 vaccine at least 8 weeks after the dose of PCV13 vaccine. Adults older than 78 years of age who have normal immune system function should obtain the PPSV23 vaccine dose at least 1 year after the dose of PCV13 vaccine.  Pneumococcal polysaccharide (PPSV23) vaccine. When PCV13 is also indicated, PCV13 should be obtained first. All adults aged 61 years and older should be immunized. An adult younger than age 66 years who has certain medical conditions should be immunized. Any person who resides in a nursing home or long-term care facility should be immunized. An adult smoker should be immunized. People with an immunocompromised condition and certain other conditions should receive both PCV13 and PPSV23 vaccines. People with human immunodeficiency virus (HIV) infection should be immunized as soon as possible after diagnosis. Immunization during chemotherapy or radiation therapy should be avoided. Routine use of PPSV23 vaccine is not recommended for American Indians, [place] Natives, or people younger than 65 years unless there are medical  conditions that require PPSV23 vaccine. When indicated, people who have unknown immunization and have no record of immunization should receive PPSV23 vaccine. One-time revaccination 5 years after the first dose of PPSV23 is recommended for people aged 19-64 years who have chronic kidney failure, nephrotic syndrome, asplenia, or immunocompromised conditions. People who received 1-2 doses of PPSV23 before age 62 years should receive  another dose of PPSV23 vaccine at age 49 years or later if at least 5 years have passed since the previous dose. Doses of PPSV23 are not needed for people immunized with PPSV23 at or after age 15 years.  Meningococcal vaccine. Adults with asplenia or persistent complement component deficiencies should receive 2 doses of quadrivalent meningococcal conjugate (MenACWY-D) vaccine. The doses should be obtained at least 2 months apart. Microbiologists working with certain meningococcal bacteria, [employer] recruits, people at risk during an outbreak, and people who travel to or live in countries with a high rate of meningitis should be immunized. A first-year college student up through age 29 years who is living in a residence hall should receive a dose if he did not receive a dose on or after his 16th birthday. Adults who have certain high-risk conditions should receive one or more doses of vaccine.  Hepatitis A vaccine. Adults who wish to be protected from this disease, have chronic liver disease, work with hepatitis A-infected animals, work in hepatitis A research labs, or travel to or work in countries with a high rate of hepatitis A should be immunized. Adults who were previously unvaccinated and who anticipate close contact with an international adoptee during the first 60 days after arrival in the [place] from a country with a high rate of hepatitis A should be immunized.  Hepatitis B vaccine. Adults should be immunized if they wish to be protected from this disease, are under  age 9 years and have diabetes, have chronic liver disease, have had more than one sex partner in the past 6 months, may be exposed to blood or other infectious body fluids, are household contacts or sex partners of hepatitis B positive people, are clients or workers in certain care facilities, or travel to or work in countries with a high rate of hepatitis B.  Haemophilus influenzae type b (Hib) vaccine. A previously unvaccinated person with asplenia or sickle cell disease or having a scheduled splenectomy should receive 1 dose of Hib vaccine. Regardless of previous immunization, a recipient of a hematopoietic stem cell transplant should receive a 3-dose series 6-12 months after his successful transplant. Hib vaccine is not recommended for adults with HIV infection. Preventive Service / Frequency Ages 13 to 50  Blood pressure check.** / Every 3-5 years.  Lipid and cholesterol check.** / Every 5 years beginning at age 76.  Hepatitis C blood test.** / For any individual with known risks for hepatitis C.  Skin self-exam. / Monthly.  Influenza vaccine. / Every year.  Tetanus, diphtheria, and acellular pertussis (Tdap, Td) vaccine.** / Consult your health care provider. 1 dose of Td every 10 years.  Varicella vaccine.** / Consult your health care provider.  HPV vaccine. / 3 doses over 6 months, if 70 or younger.  Measles, mumps, rubella (MMR) vaccine.** / You need at least 1 dose of MMR if you were born in 1957 or later. You may also need a second dose.  Pneumococcal 13-valent conjugate (PCV13) vaccine.** / Consult your health care provider.  Pneumococcal polysaccharide (PPSV23) vaccine.** / 1 to 2 doses if you smoke cigarettes or if you have certain conditions.  Meningococcal vaccine.** / 1 dose if you are age 39 to 4 years and a Market researcher living in a residence hall, or have one of several medical conditions. You may also need additional booster doses.  Hepatitis A  vaccine.** / Consult your health care provider.  Hepatitis B vaccine.** / Consult your health care provider.  Haemophilus influenzae type b (Hib) vaccine.** / Consult your health care provider. Ages 60 to 79  Blood pressure check.** / Every year.  Lipid and cholesterol check.** / Every 5 years beginning at age 72.  Lung cancer screening. / Every year if you are aged 80-80 years and have a 30-pack-year history of smoking and currently smoke or have quit within the past 15 years. Yearly screening is stopped once you have quit smoking for at least 15 years or develop a health problem that would prevent you from having lung cancer treatment.  Fecal occult blood test (FOBT) of stool. / Every year beginning at age 61 and continuing until age 55. You may not have to do this test if you get a colonoscopy every 10 years.  Flexible sigmoidoscopy** or colonoscopy.** / Every 5 years for a flexible sigmoidoscopy or every 10 years for a colonoscopy beginning at age 63 and continuing until age 76.  Hepatitis C blood test.** / For all people born from [birth year] through 1965 and any individual with known risks for hepatitis C.  Skin self-exam. / Monthly.  Influenza vaccine. / Every year.  Tetanus, diphtheria, and acellular pertussis (Tdap/Td) vaccine.** / Consult your health care provider. 1 dose of Td every 10 years.  Varicella vaccine.** / Consult your health care provider.  Zoster vaccine.** / 1 dose for adults aged 20 years or older.  Measles, mumps, rubella (MMR) vaccine.** / You need at least 1 dose of MMR if you were born in 1957 or later. You may also need a second dose.  Pneumococcal 13-valent conjugate (PCV13) vaccine.** / Consult your health care provider.  Pneumococcal polysaccharide (PPSV23) vaccine.** / 1 to 2 doses if you smoke cigarettes or if you have certain conditions.  Meningococcal vaccine.** / Consult your health care provider.  Hepatitis A vaccine.** / Consult your health care  provider.  Hepatitis B vaccine.** / Consult your health care provider.  Haemophilus influenzae type b (Hib) vaccine.** / Consult your health care provider. Ages 44 and over  Blood pressure check.** / Every year.  Lipid and cholesterol check.**/ Every 5 years beginning at age 43.  Lung cancer screening. / Every year if you are aged 72-80 years and have a 30-pack-year history of smoking and currently smoke or have quit within the past 15 years. Yearly screening is stopped once you have quit smoking for at least 15 years or develop a health problem that would prevent you from having lung cancer treatment.  Fecal occult blood test (FOBT) of stool. / Every year beginning at age 37 and continuing until age 44. You may not have to do this test if you get a colonoscopy every 10 years.  Flexible sigmoidoscopy** or colonoscopy.** / Every 5 years for a flexible sigmoidoscopy or every 10 years for a colonoscopy beginning at age 54 and continuing until age 17.  Hepatitis C blood test.** / For all people born from [birth year] through 1967 and any individual with known risks for hepatitis C.  Abdominal aortic aneurysm (AAA) screening.** / A one-time screening for ages 61 to 63 years who are current or former smokers.  Skin self-exam. / Monthly.  Influenza vaccine. / Every year.  Tetanus, diphtheria, and acellular pertussis (Tdap/Td) vaccine.** / 1 dose of Td every 10 years.  Varicella vaccine.** / Consult your health care provider.  Zoster vaccine.** / 1 dose for adults aged 79 years or older.  Pneumococcal 13-valent conjugate (PCV13) vaccine.** / 1 dose for all adults aged 48 years and older.  Pneumococcal polysaccharide (PPSV23) vaccine.** / 1 dose for all adults aged 40 years and older.  Meningococcal vaccine.** / Consult your health care provider.  Hepatitis A vaccine.** / Consult your health care provider.  Hepatitis B vaccine.** / Consult your health care provider.  Haemophilus influenzae  type b (Hib) vaccine.** / Consult your health care provider. **Family history and personal history of risk and conditions may change your health care provider's recommendations.   This information is not intended to replace advice given to you by your health care provider. Make sure you discuss any questions you have with your health care provider.   Document Released: 11/08/2001 Document Revised: 10/03/2014 Document Reviewed: 02/07/2011 Elsevier Interactive Patient Education [employer].

## 2017-08-22 ENCOUNTER — Telehealth: Payer: Self-pay | Admitting: Family Medicine

## 2017-08-22 ENCOUNTER — Other Ambulatory Visit: Payer: Self-pay

## 2017-08-22 NOTE — Telephone Encounter (Signed)
Called patient and the pharmacy, pharmacy is unable to fill the foam, changed to cream. MPulliam, CMA/RT(R)

## 2017-08-22 NOTE — Telephone Encounter (Signed)
Patient wants to speak to the nurse about his prescribed hemorrhoid meds. He can be reached on his mobile.

## 2017-08-24 ENCOUNTER — Telehealth: Payer: Self-pay

## 2017-08-24 ENCOUNTER — Other Ambulatory Visit: Payer: Self-pay

## 2017-08-24 DIAGNOSIS — K649 Unspecified hemorrhoids: Secondary | ICD-10-CM

## 2017-08-24 MED ORDER — HYDROCORTISONE ACETATE 25 MG RE SUPP: 25.0000 mg | Freq: Two times a day (BID) | RECTAL | 0 refills | Status: DC

## 2017-08-24 NOTE — Telephone Encounter (Signed)
Patient called requesting suppository for Hemorids instead of form or cream.  Patient states that the pharmacist informed him that there was a suppository that would work for the issue. Please advise. MPulliam, CMA/RT(R)

## 2017-08-24 NOTE — Telephone Encounter (Signed)
Okay to substitute suppository hydrocortisone cream for hemorrhoids

## 2017-08-24 NOTE — Telephone Encounter (Signed)
Patient requesting suppository instead of foam or cream for hemorrhoids, spoke to Dr. Raliegh Scarlet and she approved changing the medication to hydrocortisone suppository. MPulliam, CMA/RT(R)

## 2017-08-25 NOTE — Telephone Encounter (Signed)
Sent in new RX for patient. MPulliam, CMA/RT(R)

## 2017-10-03 ENCOUNTER — Other Ambulatory Visit: Payer: Self-pay

## 2017-10-03 MED ORDER — GABAPENTIN 300 MG PO CAPS: ORAL_CAPSULE | ORAL | 0 refills | Status: DC

## 2017-10-03 NOTE — Telephone Encounter (Signed)
Pharmacy sent over refill request for Gabapentin. After review of the chart medication was sent into the pharmacy. MPulliam, CMA/RT(R)

## 2017-10-23 ENCOUNTER — Ambulatory Visit (INDEPENDENT_AMBULATORY_CARE_PROVIDER_SITE_OTHER): Payer: PPO | Admitting: Adult Health

## 2017-10-23 ENCOUNTER — Encounter: Payer: Self-pay | Admitting: Adult Health

## 2017-10-23 VITALS — BP 117/68 | HR 70 | Temp 97.6°F | Ht 70.25 in | Wt 195.3 lb

## 2017-10-23 DIAGNOSIS — J0100 Acute maxillary sinusitis, unspecified: Secondary | ICD-10-CM | POA: Diagnosis not present

## 2017-10-23 MED ORDER — FLUTICASONE PROPIONATE 50 MCG/ACT NA SUSP: 2.0000 | Freq: Every day | NASAL | 6 refills | Status: DC

## 2017-10-23 MED ORDER — AZITHROMYCIN 250 MG PO TABS: ORAL_TABLET | ORAL | 0 refills | Status: DC

## 2017-10-23 NOTE — Progress Notes (Signed)
Subjective:    Patient ID: Larry Harris, male    DOB: 10/17/1940, 77 y.o.   MRN: [id number]  HPI:  Larry Harris presents with copious clear nasal drainage, post nasal gtt, and sore throat 3/10 that has been present >2.5 weeks and is steadily worsening.  He has been using OTC Sudafed with only minimal sx relief. He denies CP/dyspnea/palpiations/HA/N/V/D/fever/night sweats.   He reports Azithromycin "fixes me right up when this happens".  Patient Care Team    Relationship Specialty Notifications Start End  Mellody Dance, DO PCP - General Family Medicine  02/15/16     Patient Active Problem List   Diagnosis Date Noted  . Acute maxillary sinusitis 10/23/2017  . Injury of right elbow 05/02/2017  . Low platelet count- chronically 08/01/2016  . BPH (benign prostatic hyperplasia) 02/15/2016  . Overweight (BMI 25.0-29.9) 02/15/2016  . Spinal stenosis of lumbar region 06/04/2014     Past Medical History:  Diagnosis Date  . Acute rhinitis   . Allergic rhinitis, cause unspecified   . Allergy   . BPH (benign prostatic hyperplasia)   . CAD (coronary artery disease)   . Cancer ([place])    scalp basal cell skin ca  . Cataract    bilateral cateracts removed  . Clotting disorder ([place])   . Hypercholesteremia   . Hypotension   . Neuralgia, neuritis, and radiculitis, unspecified   . Osteoarthritis   . Spinal stenosis of lumbar region 06/04/2014  . Thrombocytopenia, unspecified ([place])      Past Surgical History:  Procedure Laterality Date  . APPENDECTOMY  1958  . bilateral cateracts removed    . COLONOSCOPY    . HERNIA REPAIR  1965   x2     Family History  Problem Relation Age of Onset  . Stroke Mother 59  . Prostate cancer Father 10  . Heart failure Brother 53  . Colon cancer Neg Hx   . Esophageal cancer Neg Hx   . Rectal cancer Neg Hx   . Stomach cancer Neg Hx      Social History   Substance and Sexual Activity  Drug Use No     Social History   Substance  and Sexual Activity  Alcohol Use Yes  . Alcohol/week: 0.6 oz  . Types: 1 Glasses of wine per week   Comment: occasional     Social History   Tobacco Use  Smoking Status Former Smoker  . Packs/day: 1.00  . Years: 25.00  . Pack years: 25.00  . Types: Cigarettes  . Last attempt to quit: 07/27/1994  . Years since quitting: 23.2  Smokeless Tobacco Never Used     Outpatient Encounter Medications as of 10/23/2017  Medication Sig  . aspirin 325 MG tablet Take 325 mg by mouth daily.    Marland Kitchen b complex vitamins tablet Take 1 tablet by mouth daily.  . calcium carbonate (OS-CAL) 600 MG TABS Take 600 mg by mouth daily.    . diphenhydrAMINE (BENADRYL) 25 mg capsule Take 25 mg by mouth as needed (insomnia).  Marland Kitchen doxazosin (CARDURA) 2 MG tablet TAKE 1 TABLET (2 MG TOTAL) BY MOUTH AT BEDTIME.  Marland Kitchen gabapentin (NEURONTIN) 300 MG capsule TAKE 1 CAPSULE BY MOUTH EVERY MORNING AND 2 CAPSULES BY MOUTH BEFORE BEDTIME  . hydrocortisone (ANUSOL-HC) 25 MG suppository Place 1 suppository (25 mg total) rectally 2 (two) times daily.  . Multiple Vitamin (MULTIVITAMIN) capsule Take 1 capsule by mouth daily.    . naproxen sodium (ANAPROX) 220 MG tablet Take  220 mg by mouth 2 (two) times daily with a meal. Sciatic nerve pain  . vitamin B-12 (CYANOCOBALAMIN) 1000 MCG tablet Take 1,000 mcg by mouth daily.    Marland Kitchen VITAMIN D, CHOLECALCIFEROL, PO Take 1,000 Units by mouth daily.   Marland Kitchen azithromycin (ZITHROMAX) 250 MG tablet 2 tabs by mouth day one.  1 tab by mouth days two-five  . fluticasone (FLONASE) 50 MCG/ACT nasal spray Place 2 sprays into both nostrils daily.  . [DISCONTINUED] pramoxine (PROCTOFOAM) 1 % foam Place 1 application rectally 3 (three) times daily as needed for anal itching.   No facility-administered encounter medications on file as of 10/23/2017.     Allergies: Patient has no known allergies.  Body mass index is 27.82 kg/m.  Blood pressure 117/68, pulse 70, temperature 97.6 F (36.4 C), temperature  source Oral, height 5' 10.25" (1.784 m), weight 195 lb 4.8 oz (88.6 kg), SpO2 100 %.     Review of Systems  Constitutional: Positive for fatigue. Negative for activity change, appetite change, chills, diaphoresis, fever and unexpected weight change.  HENT: Positive for congestion, postnasal drip, rhinorrhea, sinus pressure and sore throat. Negative for ear pain, nosebleeds, sinus pain, sneezing, trouble swallowing and voice change.   Eyes: Negative for visual disturbance.  Respiratory: Negative for cough, chest tightness, shortness of breath, wheezing and stridor.   Cardiovascular: Negative for chest pain, palpitations and leg swelling.  Gastrointestinal: Negative for abdominal distention, abdominal pain, anal bleeding, constipation, diarrhea, nausea and vomiting.  Endocrine: Negative for cold intolerance, heat intolerance, polydipsia, polyphagia and polyuria.  Genitourinary: Negative for difficulty urinating and frequency.  Neurological: Negative for dizziness and headaches.       Objective:   Physical Exam  Constitutional: He is oriented to person, place, and time. He appears well-developed and well-nourished. No distress.  HENT:  Head: Normocephalic and atraumatic.  Right Ear: External ear normal. Tympanic membrane is bulging. Tympanic membrane is not erythematous. No decreased hearing is noted.  Left Ear: External ear normal. Tympanic membrane is bulging. Tympanic membrane is not erythematous. No decreased hearing is noted.  Nose: Mucosal edema and rhinorrhea present. Right sinus exhibits maxillary sinus tenderness. Right sinus exhibits no frontal sinus tenderness. Left sinus exhibits maxillary sinus tenderness. Left sinus exhibits no frontal sinus tenderness.  Mouth/Throat: Uvula is midline and mucous membranes are normal. Posterior oropharyngeal edema and posterior oropharyngeal erythema present. No oropharyngeal exudate or tonsillar abscesses.  Eyes: Conjunctivae are normal. Pupils  are equal, round, and reactive to light.  Neck: Normal range of motion.  Cardiovascular: Normal rate, regular rhythm, normal heart sounds and intact distal pulses.  No murmur heard. Pulmonary/Chest: Effort normal and breath sounds normal. No respiratory distress. He has no wheezes. He has no rales. He exhibits no tenderness.  Lymphadenopathy:    He has no cervical adenopathy.  Neurological: He is alert and oriented to person, place, and time.  Skin: Skin is warm and dry. No rash noted. He is not diaphoretic. No erythema. No pallor.  Psychiatric: He has a normal mood and affect. His behavior is normal. Judgment and thought content normal.  Nursing note and vitals reviewed.         Assessment & Plan:   1. Acute maxillary sinusitis, recurrence not specified     Acute maxillary sinusitis Increase fluids/rest/vit c-2,000mg /day when not feeling well. Please take Azithromycin and flonase as directed. If symptoms persist after antibiotic completed, then please call clinic.    FOLLOW-UP:  Return if symptoms worsen or fail to improve.

## 2017-10-23 NOTE — Patient Instructions (Signed)

## 2017-10-23 NOTE — Assessment & Plan Note (Signed)
Increase fluids/rest/vit c-2,000mg /day when not feeling well. Please take Azithromycin and flonase as directed. If symptoms persist after antibiotic completed, then please call clinic.

## 2017-11-15 ENCOUNTER — Telehealth: Payer: Self-pay | Admitting: Family Medicine

## 2017-11-15 ENCOUNTER — Other Ambulatory Visit: Payer: Self-pay

## 2017-11-15 DIAGNOSIS — N400 Enlarged prostate without lower urinary tract symptoms: Secondary | ICD-10-CM

## 2017-11-15 MED ORDER — DOXAZOSIN MESYLATE 2 MG PO TABS
2.0000 mg | ORAL_TABLET | Freq: Every day | ORAL | 1 refills | Status: DC
Start: 2017-11-15 — End: 2018-05-02

## 2017-11-15 NOTE — Telephone Encounter (Signed)
Patient called requesting refill on Doxazosin.  Reviewed chart and sent in refill to pharmacy. MPulliam, CMA/RT(R)

## 2017-11-15 NOTE — Telephone Encounter (Signed)
Patient requesting a refill of his doxazosin sent to [facility] on [address]

## 2017-11-15 NOTE — Telephone Encounter (Signed)
Refill sent in called and notified patient. MPulliam, CMA/RT(R)

## 2017-12-11 ENCOUNTER — Ambulatory Visit (INDEPENDENT_AMBULATORY_CARE_PROVIDER_SITE_OTHER): Payer: PPO | Admitting: Adult Health

## 2017-12-11 ENCOUNTER — Encounter: Payer: Self-pay | Admitting: Adult Health

## 2017-12-11 DIAGNOSIS — J0100 Acute maxillary sinusitis, unspecified: Secondary | ICD-10-CM | POA: Diagnosis not present

## 2017-12-11 MED ORDER — AMOXICILLIN-POT CLAVULANATE 875-125 MG PO TABS: 1.0000 | ORAL_TABLET | Freq: Two times a day (BID) | ORAL | 0 refills | Status: DC

## 2017-12-11 NOTE — Assessment & Plan Note (Signed)
Increase fluids/rest/vit c-2,000mg /day. Please take Augmentin as directed. If symptoms persist after Augmentin completed, please call clinic. Continue Flonase as directed.

## 2017-12-11 NOTE — Patient Instructions (Signed)

## 2017-12-11 NOTE — Progress Notes (Signed)
Subjective:    Patient ID: Larry Harris, male    DOB: 14-Dec-1940, 77 y.o.   MRN: [id number]  HPI:  Larry Harris presents with sore throat (4/10), R ear pain (2/10), copious clear nasal drainage, post nasal gtt and non-productive cough that started >1.5 weeks ago. He was treated with Azithromycin at end of Jan 2019- he reports completing course of ABX. He has been increasing fluids/rest/ and using Flonase as directed. He denies CP/dyspnea/palpitations/dizziness. He reports "I am just missing my zip".  Patient Care Team    Relationship Specialty Notifications Start End  Mellody Dance, DO PCP - General Family Medicine  02/15/16     Patient Active Problem List   Diagnosis Date Noted  . Acute maxillary sinusitis 10/23/2017  . Injury of right elbow 05/02/2017  . Low platelet count- chronically 08/01/2016  . BPH (benign prostatic hyperplasia) 02/15/2016  . Overweight (BMI 25.0-29.9) 02/15/2016  . Spinal stenosis of lumbar region 06/04/2014     Past Medical History:  Diagnosis Date  . Acute rhinitis   . Allergic rhinitis, cause unspecified   . Allergy   . BPH (benign prostatic hyperplasia)   . CAD (coronary artery disease)   . Cancer ([place])    scalp basal cell skin ca  . Cataract    bilateral cateracts removed  . Clotting disorder ([place])   . Hypercholesteremia   . Hypotension   . Neuralgia, neuritis, and radiculitis, unspecified   . Osteoarthritis   . Spinal stenosis of lumbar region 06/04/2014  . Thrombocytopenia, unspecified ([place])      Past Surgical History:  Procedure Laterality Date  . APPENDECTOMY  1958  . bilateral cateracts removed    . COLONOSCOPY    . HERNIA REPAIR  1965   x2     Family History  Problem Relation Age of Onset  . Stroke Mother 47  . Prostate cancer Father 76  . Heart failure Brother 67  . Colon cancer Neg Hx   . Esophageal cancer Neg Hx   . Rectal cancer Neg Hx   . Stomach cancer Neg Hx      Social History   Substance and  Sexual Activity  Drug Use No     Social History   Substance and Sexual Activity  Alcohol Use Yes  . Alcohol/week: 0.6 oz  . Types: 1 Glasses of wine per week   Comment: occasional     Social History   Tobacco Use  Smoking Status Former Smoker  . Packs/day: 1.00  . Years: 25.00  . Pack years: 25.00  . Types: Cigarettes  . Last attempt to quit: 07/27/1994  . Years since quitting: 23.3  Smokeless Tobacco Never Used     Outpatient Encounter Medications as of 12/11/2017  Medication Sig  . aspirin 325 MG tablet Take 325 mg by mouth daily.    Marland Kitchen b complex vitamins tablet Take 1 tablet by mouth daily.  . calcium carbonate (OS-CAL) 600 MG TABS Take 600 mg by mouth daily.    . diphenhydrAMINE (BENADRYL) 25 mg capsule Take 25 mg by mouth as needed (insomnia).  Marland Kitchen doxazosin (CARDURA) 2 MG tablet Take 1 tablet (2 mg total) by mouth at bedtime.  . fluticasone (FLONASE) 50 MCG/ACT nasal spray Place 2 sprays into both nostrils daily.  Marland Kitchen gabapentin (NEURONTIN) 300 MG capsule TAKE 1 CAPSULE BY MOUTH EVERY MORNING AND 2 CAPSULES BY MOUTH BEFORE BEDTIME  . hydrocortisone (ANUSOL-HC) 25 MG suppository Place 1 suppository (25 mg total) rectally 2 (two)  times daily.  . Multiple Vitamin (MULTIVITAMIN) capsule Take 1 capsule by mouth daily.    . naproxen sodium (ANAPROX) 220 MG tablet Take 220 mg by mouth 2 (two) times daily with a meal. Sciatic nerve pain  . vitamin B-12 (CYANOCOBALAMIN) 1000 MCG tablet Take 1,000 mcg by mouth daily.    Marland Kitchen VITAMIN D, CHOLECALCIFEROL, PO Take 1,000 Units by mouth daily.   Marland Kitchen amoxicillin-clavulanate (AUGMENTIN) 875-125 MG tablet Take 1 tablet by mouth 2 (two) times daily.  . [DISCONTINUED] azithromycin (ZITHROMAX) 250 MG tablet 2 tabs by mouth day one.  1 tab by mouth days two-five   No facility-administered encounter medications on file as of 12/11/2017.     Allergies: Patient has no known allergies.  Body mass index is 27.79 kg/m.  Blood pressure (!) 155/66,  pulse 78, height 5' 10.24" (1.784 m), weight 195 lb (88.5 kg), SpO2 98 %.     Review of Systems  Constitutional: Positive for activity change and fatigue. Negative for appetite change, chills, diaphoresis, fever and unexpected weight change.  HENT: Positive for congestion, postnasal drip, rhinorrhea, sinus pressure and sore throat.   Respiratory: Positive for cough. Negative for chest tightness, shortness of breath, wheezing and stridor.   Cardiovascular: Negative for chest pain, palpitations and leg swelling.  Gastrointestinal: Negative for abdominal distention, abdominal pain, blood in stool, constipation, diarrhea, nausea and vomiting.  Neurological: Negative for dizziness and headaches.       Objective:   Physical Exam  Constitutional: He is oriented to person, place, and time. He appears well-developed and well-nourished. No distress.  HENT:  Head: Normocephalic and atraumatic.  Right Ear: External ear normal. Tympanic membrane is bulging. Tympanic membrane is not erythematous. Decreased hearing is noted.  Left Ear: External ear normal. Tympanic membrane is bulging. Tympanic membrane is not erythematous. Decreased hearing is noted.  Nose: Mucosal edema and rhinorrhea present. Right sinus exhibits maxillary sinus tenderness. Right sinus exhibits no frontal sinus tenderness. Left sinus exhibits maxillary sinus tenderness. Left sinus exhibits no frontal sinus tenderness.  Mouth/Throat: Uvula is midline and mucous membranes are normal. Posterior oropharyngeal edema and posterior oropharyngeal erythema present. No oropharyngeal exudate or tonsillar abscesses.  Eyes: Conjunctivae are normal. Pupils are equal, round, and reactive to light.  Neck: Normal range of motion. Neck supple.  Cardiovascular: Normal rate, regular rhythm, normal heart sounds and intact distal pulses.  No murmur heard. Pulmonary/Chest: Effort normal and breath sounds normal. No respiratory distress. He has no wheezes.  He has no rales. He exhibits no tenderness.  Lymphadenopathy:    He has no cervical adenopathy.  Neurological: He is alert and oriented to person, place, and time.  Skin: Skin is warm and dry. No rash noted. He is not diaphoretic. No erythema. No pallor.  Psychiatric: He has a normal mood and affect. His behavior is normal. Judgment and thought content normal.  Nursing note and vitals reviewed.         Assessment & Plan:   1. Acute maxillary sinusitis, recurrence not specified     Acute maxillary sinusitis Increase fluids/rest/vit c-2,000mg /day. Please take Augmentin as directed. If symptoms persist after Augmentin completed, please call clinic. Continue Flonase as directed.    FOLLOW-UP:  Return if symptoms worsen or fail to improve.

## 2017-12-25 ENCOUNTER — Other Ambulatory Visit: Payer: Self-pay | Admitting: Family Medicine

## 2017-12-28 DIAGNOSIS — L718 Other rosacea: Secondary | ICD-10-CM | POA: Diagnosis not present

## 2017-12-28 DIAGNOSIS — Z08 Encounter for follow-up examination after completed treatment for malignant neoplasm: Secondary | ICD-10-CM | POA: Diagnosis not present

## 2017-12-28 DIAGNOSIS — L738 Other specified follicular disorders: Secondary | ICD-10-CM | POA: Diagnosis not present

## 2017-12-28 DIAGNOSIS — L821 Other seborrheic keratosis: Secondary | ICD-10-CM | POA: Diagnosis not present

## 2017-12-28 DIAGNOSIS — D225 Melanocytic nevi of trunk: Secondary | ICD-10-CM | POA: Diagnosis not present

## 2017-12-28 DIAGNOSIS — L570 Actinic keratosis: Secondary | ICD-10-CM | POA: Diagnosis not present

## 2017-12-28 DIAGNOSIS — Z85828 Personal history of other malignant neoplasm of skin: Secondary | ICD-10-CM | POA: Diagnosis not present

## 2018-02-14 ENCOUNTER — Encounter: Payer: Self-pay | Admitting: Family Medicine

## 2018-02-14 ENCOUNTER — Ambulatory Visit (INDEPENDENT_AMBULATORY_CARE_PROVIDER_SITE_OTHER): Payer: PPO | Admitting: Family Medicine

## 2018-02-14 VITALS — BP 111/67 | HR 76 | Ht 70.0 in | Wt 193.4 lb

## 2018-02-14 DIAGNOSIS — Z0000 Encounter for general adult medical examination without abnormal findings: Secondary | ICD-10-CM

## 2018-02-14 DIAGNOSIS — Z136 Encounter for screening for cardiovascular disorders: Secondary | ICD-10-CM | POA: Diagnosis not present

## 2018-02-14 NOTE — Patient Instructions (Addendum)
Please check BP at home and write it down.  Bring in numbers next OV for my review.    Preventive Care for Adults, Male A healthy lifestyle and preventive care can promote health and wellness. Preventive health guidelines for men include the following key practices:  A routine yearly physical is a good way to check with your health care provider about your health and preventative screening. It is a chance to share any concerns and updates on your health and to receive a thorough exam.  Visit your dentist for a routine exam and preventative care every 6 months. Brush your teeth twice a day and floss once a day. Good oral hygiene prevents tooth decay and gum disease.  The frequency of eye exams is based on your age, health, family medical history, use of contact lenses, and other factors. Follow your health care provider's recommendations for frequency of eye exams.  Eat a healthy diet. Foods such as vegetables, fruits, whole grains, low-fat dairy products, and lean protein foods contain the nutrients you need without too many calories. Decrease your intake of foods high in solid fats, added sugars, and salt. Eat the right amount of calories for you.Get information about a proper diet from your health care provider, if necessary.  Regular physical exercise is one of the most important things you can do for your health. Most adults should get at least 150 minutes of moderate-intensity exercise (any activity that increases your heart rate and causes you to sweat) each week. In addition, most adults need muscle-strengthening exercises on 2 or more days a week.  Maintain a healthy weight. The body mass index (BMI) is a screening tool to identify possible weight problems. It provides an estimate of body fat based on height and weight. Your health care provider can find your BMI and can help you achieve or maintain a healthy weight.For adults 20 years and older:  A BMI below 18.5 is considered  underweight.  A BMI of 18.5 to 24.9 is normal.  A BMI of 25 to 29.9 is considered overweight.  A BMI of 30 and above is considered obese.  Maintain normal blood lipids and cholesterol levels by exercising and minimizing your intake of saturated fat. Eat a balanced diet with plenty of fruit and vegetables. Blood tests for lipids and cholesterol should begin at age 39 and be repeated every 5 years. If your lipid or cholesterol levels are high, you are over 50, or you are at high risk for heart disease, you may need your cholesterol levels checked more frequently.Ongoing high lipid and cholesterol levels should be treated with medicines if diet and exercise are not working.  If you smoke, find out from your health care provider how to quit. If you do not use tobacco, do not start.  Lung cancer screening is recommended for adults aged 27-80 years who are at high risk for developing lung cancer because of a history of smoking. A yearly low-dose CT scan of the lungs is recommended for people who have at least a 30-pack-year history of smoking and are a current smoker or have quit within the past 15 years. A pack year of smoking is smoking an average of 1 pack of cigarettes a day for 1 year (for example: 1 pack a day for 30 years or 2 packs a day for 15 years). Yearly screening should continue until the smoker has stopped smoking for at least 15 years. Yearly screening should be stopped for people who develop a health  problem that would prevent them from having lung cancer treatment.  If you choose to drink alcohol, do not have more than 2 drinks per day. One drink is considered to be 12 ounces (355 mL) of beer, 5 ounces (148 mL) of wine, or 1.5 ounces (44 mL) of liquor.  Avoid use of street drugs. Do not share needles with anyone. Ask for help if you need support or instructions about stopping the use of drugs.  High blood pressure causes heart disease and increases the risk of stroke. Your blood  pressure should be checked at least every 1-2 years. Ongoing high blood pressure should be treated with medicines, if weight loss and exercise are not effective.  If you are 43-51 years old, ask your health care provider if you should take aspirin to prevent heart disease.  Diabetes screening is done by taking a blood sample to check your blood glucose level after you have not eaten for a certain period of time (fasting). If you are not overweight and you do not have risk factors for diabetes, you should be screened once every 3 years starting at age 33. If you are overweight or obese and you are 76-6 years of age, you should be screened for diabetes every year as part of your cardiovascular risk assessment.  Colorectal cancer can be detected and often prevented. Most routine colorectal cancer screening begins at the age of 73 and continues through age 80. However, your health care provider may recommend screening at an earlier age if you have risk factors for colon cancer. On a yearly basis, your health care provider may provide home test kits to check for hidden blood in the stool. Use of a small camera at the end of a tube to directly examine the colon (sigmoidoscopy or colonoscopy) can detect the earliest forms of colorectal cancer. Talk to your health care provider about this at age 31, when routine screening begins. Direct exam of the colon should be repeated every 5-10 years through age 87, unless early forms of precancerous polyps or small growths are found.  People who are at an increased risk for hepatitis B should be screened for this virus. You are considered at high risk for hepatitis B if:  You were born in a country where hepatitis B occurs often. Talk with your health care provider about which countries are considered high risk.  Your parents were born in a high-risk country and you have not received a shot to protect against hepatitis B (hepatitis B vaccine).  You have HIV or  AIDS.  You use needles to inject street drugs.  You live with, or have sex with, someone who has hepatitis B.  You are a man who has sex with other men (MSM).  You get hemodialysis treatment.  You take certain medicines for conditions such as cancer, organ transplantation, and autoimmune conditions.  Hepatitis C blood testing is recommended for all people born from [birth year] through 1965 and any individual with known risks for hepatitis C.  Practice safe sex. Use condoms and avoid high-risk sexual practices to reduce the spread of sexually transmitted infections (STIs). STIs include gonorrhea, chlamydia, syphilis, trichomonas, herpes, HPV, and human immunodeficiency virus (HIV). Herpes, HIV, and HPV are viral illnesses that have no cure. They can result in disability, cancer, and death.  If you are a man who has sex with other men, you should be screened at least once per year for:  HIV.  Urethral, rectal, and pharyngeal infection of gonorrhea,  chlamydia, or both.  If you are at risk of being infected with HIV, it is recommended that you take a prescription medicine daily to prevent HIV infection. This is called preexposure prophylaxis (PrEP). You are considered at risk if:  You are a man who has sex with other men (MSM) and have other risk factors.  You are a heterosexual man, are sexually active, and are at increased risk for HIV infection.  You take drugs by injection.  You are sexually active with a partner who has HIV.  Talk with your health care provider about whether you are at high risk of being infected with HIV. If you choose to begin PrEP, you should first be tested for HIV. You should then be tested every 3 months for as long as you are taking PrEP.  A one-time screening for abdominal aortic aneurysm (AAA) and surgical repair of large AAAs by ultrasound are recommended for men ages 39 to 60 years who are current or former smokers.  Healthy men should no longer receive  prostate-specific antigen (PSA) blood tests as part of routine cancer screening. Talk with your health care provider about prostate cancer screening.  Testicular cancer screening is not recommended for adult males who have no symptoms. Screening includes self-exam, a health care provider exam, and other screening tests. Consult with your health care provider about any symptoms you have or any concerns you have about testicular cancer.  Use sunscreen. Apply sunscreen liberally and repeatedly throughout the day. You should seek shade when your shadow is shorter than you. Protect yourself by wearing long sleeves, pants, a wide-brimmed hat, and sunglasses year round, whenever you are outdoors.  Once a month, do a whole-body skin exam, using a mirror to look at the skin on your back. Tell your health care provider about new moles, moles that have irregular borders, moles that are larger than a pencil eraser, or moles that have changed in shape or color.  Stay current with required vaccines (immunizations).  Influenza vaccine. All adults should be immunized every year.  Tetanus, diphtheria, and acellular pertussis (Td, Tdap) vaccine. An adult who has not previously received Tdap or who does not know his vaccine status should receive 1 dose of Tdap. This initial dose should be followed by tetanus and diphtheria toxoids (Td) booster doses every 10 years. Adults with an unknown or incomplete history of completing a 3-dose immunization series with Td-containing vaccines should begin or complete a primary immunization series including a Tdap dose. Adults should receive a Td booster every 10 years.  Varicella vaccine. An adult without evidence of immunity to varicella should receive 2 doses or a second dose if he has previously received 1 dose.  Human papillomavirus (HPV) vaccine. Males aged 11-21 years who have not received the vaccine previously should receive the 3-dose series. Males aged 22-26 years may be  immunized. Immunization is recommended through the age of 64 years for any male who has sex with males and did not get any or all doses earlier. Immunization is recommended for any person with an immunocompromised condition through the age of 50 years if he did not get any or all doses earlier. During the 3-dose series, the second dose should be obtained 4-8 weeks after the first dose. The third dose should be obtained 24 weeks after the first dose and 16 weeks after the second dose.  Zoster vaccine. One dose is recommended for adults aged 49 years or older unless certain conditions are present.  Measles,  mumps, and rubella (MMR) vaccine. Adults born before [birth year] generally are considered immune to measles and mumps. Adults born in [birth year] or later should have 1 or more doses of MMR vaccine unless there is a contraindication to the vaccine or there is laboratory evidence of immunity to each of the three diseases. A routine second dose of MMR vaccine should be obtained at least 28 days after the first dose for students attending postsecondary schools, health care workers, or international travelers. People who received inactivated measles vaccine or an unknown type of measles vaccine during 1963-1967 should receive 2 doses of MMR vaccine. People who received inactivated mumps vaccine or an unknown type of mumps vaccine before 1979 and are at high risk for mumps infection should consider immunization with 2 doses of MMR vaccine. Unvaccinated health care workers born before [birth year] who lack laboratory evidence of measles, mumps, or rubella immunity or laboratory confirmation of disease should consider measles and mumps immunization with 2 doses of MMR vaccine or rubella immunization with 1 dose of MMR vaccine.  Pneumococcal 13-valent conjugate (PCV13) vaccine. When indicated, a person who is uncertain of his immunization history and has no record of immunization should receive the PCV13 vaccine. All adults 70 years of  age and older should receive this vaccine. An adult aged 70 years or older who has certain medical conditions and has not been previously immunized should receive 1 dose of PCV13 vaccine. This PCV13 should be followed with a dose of pneumococcal polysaccharide (PPSV23) vaccine. Adults who are at high risk for pneumococcal disease should obtain the PPSV23 vaccine at least 8 weeks after the dose of PCV13 vaccine. Adults older than 79 years of age who have normal immune system function should obtain the PPSV23 vaccine dose at least 1 year after the dose of PCV13 vaccine.  Pneumococcal polysaccharide (PPSV23) vaccine. When PCV13 is also indicated, PCV13 should be obtained first. All adults aged 16 years and older should be immunized. An adult younger than age 61 years who has certain medical conditions should be immunized. Any person who resides in a nursing home or long-term care facility should be immunized. An adult smoker should be immunized. People with an immunocompromised condition and certain other conditions should receive both PCV13 and PPSV23 vaccines. People with human immunodeficiency virus (HIV) infection should be immunized as soon as possible after diagnosis. Immunization during chemotherapy or radiation therapy should be avoided. Routine use of PPSV23 vaccine is not recommended for American Indians, [place] Natives, or people younger than 65 years unless there are medical conditions that require PPSV23 vaccine. When indicated, people who have unknown immunization and have no record of immunization should receive PPSV23 vaccine. One-time revaccination 5 years after the first dose of PPSV23 is recommended for people aged 19-64 years who have chronic kidney failure, nephrotic syndrome, asplenia, or immunocompromised conditions. People who received 1-2 doses of PPSV23 before age 33 years should receive another dose of PPSV23 vaccine at age 5 years or later if at least 5 years have passed since the  previous dose. Doses of PPSV23 are not needed for people immunized with PPSV23 at or after age 64 years.  Meningococcal vaccine. Adults with asplenia or persistent complement component deficiencies should receive 2 doses of quadrivalent meningococcal conjugate (MenACWY-D) vaccine. The doses should be obtained at least 2 months apart. Microbiologists working with certain meningococcal bacteria, [employer] recruits, people at risk during an outbreak, and people who travel to or live in countries with a high rate of meningitis should  be immunized. A first-year college student up through age 5 years who is living in a residence hall should receive a dose if he did not receive a dose on or after his 16th birthday. Adults who have certain high-risk conditions should receive one or more doses of vaccine.  Hepatitis A vaccine. Adults who wish to be protected from this disease, have chronic liver disease, work with hepatitis A-infected animals, work in hepatitis A research labs, or travel to or work in countries with a high rate of hepatitis A should be immunized. Adults who were previously unvaccinated and who anticipate close contact with an international adoptee during the first 60 days after arrival in the [place] from a country with a high rate of hepatitis A should be immunized.  Hepatitis B vaccine. Adults should be immunized if they wish to be protected from this disease, are under age 64 years and have diabetes, have chronic liver disease, have had more than one sex partner in the past 6 months, may be exposed to blood or other infectious body fluids, are household contacts or sex partners of hepatitis B positive people, are clients or workers in certain care facilities, or travel to or work in countries with a high rate of hepatitis B.  Haemophilus influenzae type b (Hib) vaccine. A previously unvaccinated person with asplenia or sickle cell disease or having a scheduled splenectomy should receive 1  dose of Hib vaccine. Regardless of previous immunization, a recipient of a hematopoietic stem cell transplant should receive a 3-dose series 6-12 months after his successful transplant. Hib vaccine is not recommended for adults with HIV infection. Preventive Service / Frequency Ages 58 to 2  Blood pressure check.** / Every 3-5 years.  Lipid and cholesterol check.** / Every 5 years beginning at age 27.  Hepatitis C blood test.** / For any individual with known risks for hepatitis C.  Skin self-exam. / Monthly.  Influenza vaccine. / Every year.  Tetanus, diphtheria, and acellular pertussis (Tdap, Td) vaccine.** / Consult your health care provider. 1 dose of Td every 10 years.  Varicella vaccine.** / Consult your health care provider.  HPV vaccine. / 3 doses over 6 months, if 22 or younger.  Measles, mumps, rubella (MMR) vaccine.** / You need at least 1 dose of MMR if you were born in 1957 or later. You may also need a second dose.  Pneumococcal 13-valent conjugate (PCV13) vaccine.** / Consult your health care provider.  Pneumococcal polysaccharide (PPSV23) vaccine.** / 1 to 2 doses if you smoke cigarettes or if you have certain conditions.  Meningococcal vaccine.** / 1 dose if you are age 26 to 61 years and a [employer] researcher living in a residence hall, or have one of several medical conditions. You may also need additional booster doses.  Hepatitis A vaccine.** / Consult your health care provider.  Hepatitis B vaccine.** / Consult your health care provider.  Haemophilus influenzae type b (Hib) vaccine.** / Consult your health care provider. Ages 25 to 58  Blood pressure check.** / Every year.  Lipid and cholesterol check.** / Every 5 years beginning at age 11.  Lung cancer screening. / Every year if you are aged 74-80 years and have a 30-pack-year history of smoking and currently smoke or have quit within the past 15 years. Yearly screening is stopped once you have  quit smoking for at least 15 years or develop a health problem that would prevent you from having lung cancer treatment.  Fecal occult blood test (  FOBT) of stool. / Every year beginning at age 15 and continuing until age 48. You may not have to do this test if you get a colonoscopy every 10 years.  Flexible sigmoidoscopy** or colonoscopy.** / Every 5 years for a flexible sigmoidoscopy or every 10 years for a colonoscopy beginning at age 84 and continuing until age 106.  Hepatitis C blood test.** / For all people born from [birth year] through 1965 and any individual with known risks for hepatitis C.  Skin self-exam. / Monthly.  Influenza vaccine. / Every year.  Tetanus, diphtheria, and acellular pertussis (Tdap/Td) vaccine.** / Consult your health care provider. 1 dose of Td every 10 years.  Varicella vaccine.** / Consult your health care provider.  Zoster vaccine.** / 1 dose for adults aged 65 years or older.  Measles, mumps, rubella (MMR) vaccine.** / You need at least 1 dose of MMR if you were born in 1959 or later. You may also need a second dose.  Pneumococcal 13-valent conjugate (PCV13) vaccine.** / Consult your health care provider.  Pneumococcal polysaccharide (PPSV23) vaccine.** / 1 to 2 doses if you smoke cigarettes or if you have certain conditions.  Meningococcal vaccine.** / Consult your health care provider.  Hepatitis A vaccine.** / Consult your health care provider.  Hepatitis B vaccine.** / Consult your health care provider.  Haemophilus influenzae type b (Hib) vaccine.** / Consult your health care provider. Ages 33 and over  Blood pressure check.** / Every year.  Lipid and cholesterol check.**/ Every 5 years beginning at age 53.  Lung cancer screening. / Every year if you are aged 57-80 years and have a 30-pack-year history of smoking and currently smoke or have quit within the past 15 years. Yearly screening is stopped once you have quit smoking for at least 15 years or  develop a health problem that would prevent you from having lung cancer treatment.  Fecal occult blood test (FOBT) of stool. / Every year beginning at age 7 and continuing until age 3. You may not have to do this test if you get a colonoscopy every 10 years.  Flexible sigmoidoscopy** or colonoscopy.** / Every 5 years for a flexible sigmoidoscopy or every 10 years for a colonoscopy beginning at age 63 and continuing until age 56.  Hepatitis C blood test.** / For all people born from [birth year] through 1967 and any individual with known risks for hepatitis C.  Abdominal aortic aneurysm (AAA) screening.** / A one-time screening for ages 43 to 72 years who are current or former smokers.  Skin self-exam. / Monthly.  Influenza vaccine. / Every year.  Tetanus, diphtheria, and acellular pertussis (Tdap/Td) vaccine.** / 1 dose of Td every 10 years.  Varicella vaccine.** / Consult your health care provider.  Zoster vaccine.** / 1 dose for adults aged 53 years or older.  Pneumococcal 13-valent conjugate (PCV13) vaccine.** / 1 dose for all adults aged 80 years and older.  Pneumococcal polysaccharide (PPSV23) vaccine.** / 1 dose for all adults aged 65 years and older.  Meningococcal vaccine.** / Consult your health care provider.  Hepatitis A vaccine.** / Consult your health care provider.  Hepatitis B vaccine.** / Consult your health care provider.  Haemophilus influenzae type b (Hib) vaccine.** / Consult your health care provider. **Family history and personal history of risk and conditions may change your health care provider's recommendations.   This information is not intended to replace advice given to you by your health care provider. Make sure you discuss any questions you have  with your health care provider.   Document Released: 11/08/2001 Document Revised: 10/03/2014 Document Reviewed: 02/07/2011 Elsevier Interactive Patient Education 2016 [employer].   Managing Your  Hypertension Hypertension is commonly called high blood pressure. This is when the force of your blood pressing against the walls of your arteries is too strong. Arteries are blood vessels that carry blood from your heart throughout your body. Hypertension forces the heart to work harder to pump blood, and may cause the arteries to become narrow or stiff. Having untreated or uncontrolled hypertension can cause heart attack, stroke, kidney disease, and other problems. What are blood pressure readings? A blood pressure reading consists of a higher number over a lower number. Ideally, your blood pressure should be below 120/80. The first ("top") number is called the systolic pressure. It is a measure of the pressure in your arteries as your heart beats. The second ("bottom") number is called the diastolic pressure. It is a measure of the pressure in your arteries as the heart relaxes. What does my blood pressure reading mean? Blood pressure is classified into four stages. Based on your blood pressure reading, your health care provider may use the following stages to determine what type of treatment you need, if any. Systolic pressure and diastolic pressure are measured in a unit called mm Hg. Normal  Systolic pressure: below 001.  Diastolic pressure: below 80. Elevated  Systolic pressure: 749-449.  Diastolic pressure: below 80. Hypertension stage 1  Systolic pressure: 675-916.  Diastolic pressure: 38-46. Hypertension stage 2  Systolic pressure: 659 or above.  Diastolic pressure: 90 or above. What health risks are associated with hypertension? Managing your hypertension is an important responsibility. Uncontrolled hypertension can lead to:  A heart attack.  A stroke.  A weakened blood vessel (aneurysm).  Heart failure.  Kidney damage.  Eye damage.  Metabolic syndrome.  Memory and concentration problems.  What changes can I make to manage my hypertension? Hypertension can be  managed by making lifestyle changes and possibly by taking medicines. Your health care provider will help you make a plan to bring your blood pressure within a normal range. Eating and drinking  Eat a diet that is high in fiber and potassium, and low in salt (sodium), added sugar, and fat. An example eating plan is called the DASH (Dietary Approaches to Stop Hypertension) diet. To eat this way: ? Eat plenty of fresh fruits and vegetables. Try to fill half of your plate at each meal with fruits and vegetables. ? Eat whole grains, such as whole wheat pasta, brown rice, or whole grain bread. Fill about one quarter of your plate with whole grains. ? Eat low-fat diary products. ? Avoid fatty cuts of meat, processed or cured meats, and poultry with skin. Fill about one quarter of your plate with lean proteins such as fish, chicken without skin, beans, eggs, and tofu. ? Avoid premade and processed foods. These tend to be higher in sodium, added sugar, and fat.  Reduce your daily sodium intake. Most people with hypertension should eat less than 1,500 mg of sodium a day.  Limit alcohol intake to no more than 1 drink a day for nonpregnant women and 2 drinks a day for men. One drink equals 12 oz of beer, 5 oz of wine, or 1 oz of hard liquor. Lifestyle  Work with your health care provider to maintain a healthy body weight, or to lose weight. Ask what an ideal weight is for you.  Get at least 30 minutes  of exercise that causes your heart to beat faster (aerobic exercise) most days of the week. Activities may include walking, swimming, or biking.  Include exercise to strengthen your muscles (resistance exercise), such as weight lifting, as part of your weekly exercise routine. Try to do these types of exercises for 30 minutes at least 3 days a week.  Do not use any products that contain nicotine or tobacco, such as cigarettes and e-cigarettes. If you need help quitting, ask your health care  provider.  Control any long-term (chronic) conditions you have, such as high cholesterol or diabetes. Monitoring  Monitor your blood pressure at home as told by your health care provider. Your personal target blood pressure may vary depending on your medical conditions, your age, and other factors.  Have your blood pressure checked regularly, as often as told by your health care provider. Working with your health care provider  Review all the medicines you take with your health care provider because there may be side effects or interactions.  Talk with your health care provider about your diet, exercise habits, and other lifestyle factors that may be contributing to hypertension.  Visit your health care provider regularly. Your health care provider can help you create and adjust your plan for managing hypertension. Will I need medicine to control my blood pressure? Your health care provider may prescribe medicine if lifestyle changes are not enough to get your blood pressure under control, and if:  Your systolic blood pressure is 130 or higher.  Your diastolic blood pressure is 80 or higher.  Take medicines only as told by your health care provider. Follow the directions carefully. Blood pressure medicines must be taken as prescribed. The medicine does not work as well when you skip doses. Skipping doses also puts you at risk for problems. Contact a health care provider if:  You think you are having a reaction to medicines you have taken.  You have repeated (recurrent) headaches.  You feel dizzy.  You have swelling in your ankles.  You have trouble with your vision. Get help right away if:  You develop a severe headache or confusion.  You have unusual weakness or numbness, or you feel faint.  You have severe pain in your chest or abdomen.  You vomit repeatedly.  You have trouble breathing. Summary  Hypertension is when the force of blood pumping through your arteries is  too strong. If this condition is not controlled, it may put you at risk for serious complications.  Your personal target blood pressure may vary depending on your medical conditions, your age, and other factors. For most people, a normal blood pressure is less than 120/80.  Hypertension is managed by lifestyle changes, medicines, or both. Lifestyle changes include weight loss, eating a healthy, low-sodium diet, exercising more, and limiting alcohol. This information is not intended to replace advice given to you by your health care provider. Make sure you discuss any questions you have with your health care provider. Document Released: 06/06/2012 Document Revised: 08/10/2016 Document Reviewed: 08/10/2016 Elsevier Interactive Patient Education  2018 [employer].    How to Increase Your Level of Physical Activity  Getting regular physical activity is important for your overall health and well-being. Most people do not get enough exercise. There are easy ways to increase your level of physical activity, even if you have not been very active in the past or you are just starting out. Why is physical activity important? Physical activity has many short-term and long-term health  benefits. Regular exercise can:  Help you lose weight or maintain a healthy weight.  Strengthen your muscles and bones.  Boost your mood and improve self-esteem.  Reduce your risk of certain long-term (chronic) diseases, like heart disease, cancer, and diabetes.  Help you stay capable of walking and moving around (mobile) as you age.  Prevent accidents, such as falls, as you age.  Increase life expectancy.  What are the benefits of being physically active on a regular basis? In addition to improving your physical health, being physically active on most days of the week can help you in ways that you may not expect. Benefits of regular physical activity may include:  Feeling good about your body.  Being able to  move around more easily and for longer periods of time without getting tired (increased stamina).  Finding new sources of fun and enjoyment.  Meeting new people who share a common interest.  Being able to fight off illness better (enhanced immunity).  Being able to sleep better.  What can happen if I am not physically active on a regular basis? Not getting enough physical activity can lead to an unhealthy lifestyle and future health problems. This can increase your chances of:  Becoming overweight or obese.  Becoming sick.  Developing chronic illnesses, like heart disease or diabetes.  Having mental health problems, like depression or anxiety.  Having sleep problems.  Having trouble walking or getting yourself around (reduced mobility).  Injuring yourself in a fall as you get older.  What steps can I take to be more physically active?  Check with your health care provider about how to get started. Ask your health care provider what activities are safe for you.  Start out slowly. Walking or doing some simple chair exercises is a good place to start, especially if you have not been active before or for a long time.  Try to find activities that you enjoy. You are more likely to commit to an exercise routine if it does not feel like a chore.  If you have bone or joint problems, choose low-impact exercises, like walking or swimming.  Include physical activity in your everyday routine.  Invite friends or family members to exercise with you. This also will help you commit to your workout plan.  Set goals that you can work toward.  Aim for at least 150 minutes of moderate-intensity exercise each week. Examples of moderate-intensity exercise include walking or riding a bike. Where to find more information:  Centers for Disease Control and Prevention: [employer]is  President's Council on [employer], Sports & Nutrition  www.http://villegas.org/  ChooseMyPlate: [employer] Contact a health care provider if:  You have headaches, muscle aches, or joint pain.  You feel dizzy or light-headed while exercising.  You faint.  You have chest pain while exercising. Summary  Exercise benefits your mind and body at any age, even if you are just starting out.  If you have a chronic illness or have not been active for a while, check with your health care provider before increasing your physical activity.  Choose activities that are safe and enjoyable for you.Ask your health care provider what activities are safe for you.  Start slowly. Tell your health care provider if you have problems as you start to increase your activity level. This information is not intended to replace advice given to you by your health care provider. Make sure you discuss any questions you have with your health care provider. Document Released: 09/01/2016 Document Revised: 09/01/2016  Document Reviewed: 09/01/2016 Elsevier Interactive Patient Education  [employer].

## 2018-02-14 NOTE — Progress Notes (Signed)
Subjective:   Larry Harris is a 77 y.o. male who presents for Medicare Annual/Subsequent preventive examination.  Activities of Daily Living In your present state of health, do you have difficulty performing the following activities?  1- Driving - no 2- Managing money - no 3- Feeding yourself - no 4- Getting from the bed to the chair - no 5- Climbing a flight of stairs - no 6- Preparing food and eating - no 7- Bathing or showering - no 8- Getting dressed - no 9- Getting to the toilet - no 10- Using the toilet - no 11- Moving around from place to place - no  Patient states that he feels safe at home.  Fall Risk  02/14/2018 08/16/2017 07/25/2016  Falls in the past year? No Yes No  Number falls in past yr: - 1 -  Injury with Fall? - No -  Follow up - Falls evaluation completed -   Depression screen [facility] 2/9 02/14/2018 10/23/2017 08/16/2017 07/25/2016  Decreased Interest 0 1 0 0  Down, Depressed, Hopeless 0 0 0 0  PHQ - 2 Score 0 1 0 0  Altered sleeping 0 0 0 -  Tired, decreased energy 0 1 1 -  Change in appetite 0 0 0 -  Feeling bad or failure about yourself  0 0 0 -  Trouble concentrating 0 0 0 -  Moving slowly or fidgety/restless 0 0 0 -  Suicidal thoughts 0 0 0 -  PHQ-9 Score 0 2 1 -  Difficult doing work/chores Not difficult at all Somewhat difficult Not difficult at all -   6CIT Screen 02/14/2018 08/16/2017  What Year? 0 points 0 points  What month? 0 points 0 points  What time? 0 points 0 points  Count back from 20 0 points 0 points  Months in reverse 0 points 0 points  Repeat phrase 0 points 0 points  Total Score 0 0    Current Exercise Habits: Home exercise routine, Type of exercise: walking, Time (Minutes): 30, Frequency (Times/Week): 7, Weekly Exercise (Minutes/Week): 210, Intensity: Mild   Functional Status Survey: Is the patient deaf or have difficulty hearing?: Yes(difficulty understanding soft or whispered voices.) Does the patient have difficulty  seeing, even when wearing glasses/contacts?: No Does the patient have difficulty concentrating, remembering, or making decisions?: Yes(patient often misplaces items) Does the patient have difficulty walking or climbing stairs?: No Does the patient have difficulty dressing or bathing?: No Does the patient have difficulty doing errands alone such as visiting a doctor's office or shopping?: No   Review of Systems: General:   Denies fever, chills, unexplained weight loss.  Optho/Auditory:   Denies visual changes, blurred vision/LOV Respiratory:   Denies SOB, DOE more than baseline levels.  Cardiovascular:   Denies chest pain, palpitations, new onset peripheral edema  Gastrointestinal:   Denies nausea, vomiting, diarrhea.  Genitourinary: Denies dysuria, freq/ urgency, flank pain or discharge from genitals.  Endocrine:     Denies hot or cold intolerance, polyuria, polydipsia. Musculoskeletal:   Denies unexplained myalgias, joint swelling, unexplained arthralgias, gait problems.  Skin:  Denies rash, suspicious lesions Neurological:     Denies dizziness, unexplained weakness, numbness  Psychiatric/Behavioral:   Denies mood changes, suicidal or homicidal ideations, hallucinations       Objective:    Vitals: BP 111/67   Pulse 76   Ht 5\' 10"  (1.778 m)   Wt 193 lb 6.4 oz (87.7 kg)   SpO2 100%   BMI 27.75 kg/m   Body  mass index is 27.75 kg/m.  Advanced Directives 02/15/2016 06/10/2015 06/08/2015  Does Patient Have a Medical Advance Directive? Yes Yes Yes  Type of Advance Directive Living will [address];Living will Living will  Does patient want to make changes to medical advance directive? No - Patient declined - No - Patient declined  Copy of [place] in Chart? No - copy requested - No - copy requested    Tobacco Social History   Tobacco Use  Smoking Status Former Smoker  . Packs/day: 1.00  . Years: 25.00  . Pack years: 25.00  . Types:  Cigarettes  . Last attempt to quit: 07/27/1994  . Years since quitting: 23.5  Smokeless Tobacco Never Used     Counseling given: Not Answered    Past Medical History:  Diagnosis Date  . Acute rhinitis   . Allergic rhinitis, cause unspecified   . Allergy   . BPH (benign prostatic hyperplasia)   . CAD (coronary artery disease)   . Cancer ([place])    scalp basal cell skin ca  . Cataract    bilateral cateracts removed  . Clotting disorder ([place])   . Hypercholesteremia   . Hypotension   . Neuralgia, neuritis, and radiculitis, unspecified   . Osteoarthritis   . Spinal stenosis of lumbar region 06/04/2014  . Thrombocytopenia, unspecified ([place])     Past Surgical History:  Procedure Laterality Date  . APPENDECTOMY  1958  . bilateral cateracts removed    . COLONOSCOPY    . HERNIA REPAIR  1965   x2    Family History  Problem Relation Age of Onset  . Stroke Mother 14  . Prostate cancer Father 14  . Heart failure Brother 67  . Colon cancer Neg Hx   . Esophageal cancer Neg Hx   . Rectal cancer Neg Hx   . Stomach cancer Neg Hx     Social History   Socioeconomic History  . Marital status: Married    Spouse name: Not on file  . Number of children: 2  . Years of education: college 4  . Highest education level: Not on file  Occupational History  . Occupation: Retired  Scientific laboratory technician  . Financial resource strain: Not on file  . Food insecurity:    Worry: Not on file    Inability: Not on file  . Transportation needs:    Medical: Not on file    Non-medical: Not on file  Tobacco Use  . Smoking status: Former Smoker    Packs/day: 1.00    Years: 25.00    Pack years: 25.00    Types: Cigarettes    Last attempt to quit: 07/27/1994    Years since quitting: 23.5  . Smokeless tobacco: Never Used  Substance and Sexual Activity  . Alcohol use: Yes    Alcohol/week: 0.6 oz    Types: 1 Glasses of wine per week    Comment: occasional  . Drug use: No  . Sexual activity: Not on file   Lifestyle  . Physical activity:    Days per week: Not on file    Minutes per session: Not on file  . Stress: Not on file  Relationships  . Social connections:    Talks on phone: Not on file    Gets together: Not on file    Attends religious service: Not on file    Active member of club or organization: Not on file    Attends meetings of clubs or organizations:  Not on file    Relationship status: Not on file  Other Topics Concern  . Not on file  Social History Narrative   The patient is widowed. 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 He has no history of alcohol use. He has previously done regular exercise until developing a back problem.      Patient drinks about 3-4 cups of coffee daily.   Patient is right handed.     Outpatient Encounter Medications as of 02/14/2018  Medication Sig  . aspirin 325 MG tablet Take 325 mg by mouth daily.    Marland Kitchen b complex vitamins tablet Take 1 tablet by mouth daily.  . calcium carbonate (OS-CAL) 600 MG TABS Take 600 mg by mouth daily.    . diphenhydrAMINE (BENADRYL) 25 mg capsule Take 25 mg by mouth as needed (insomnia).  Marland Kitchen doxazosin (CARDURA) 2 MG tablet Take 1 tablet (2 mg total) by mouth at bedtime.  . fluticasone (FLONASE) 50 MCG/ACT nasal spray Place 2 sprays into both nostrils daily.  Marland Kitchen gabapentin (NEURONTIN) 300 MG capsule TAKE 1 CAPSULE BY MOUTH EVERY MORNING AND 2 CAPSULES BY MOUTH BEFORE BEDTIME  . hydrocortisone (ANUSOL-HC) 25 MG suppository Place 1 suppository (25 mg total) rectally 2 (two) times daily.  . Multiple Vitamin (MULTIVITAMIN) capsule Take 1 capsule by mouth daily.    . naproxen sodium (ANAPROX) 220 MG tablet Take 220 mg by mouth 2 (two) times daily with a meal. Sciatic nerve pain  . vitamin B-12 (CYANOCOBALAMIN) 1000 MCG tablet Take 1,000 mcg by mouth daily.    Marland Kitchen VITAMIN D, CHOLECALCIFEROL, PO Take 1,000 Units by mouth daily.   . [DISCONTINUED] amoxicillin-clavulanate (AUGMENTIN) 875-125 MG tablet Take 1 tablet by  mouth 2 (two) times daily.   No facility-administered encounter medications on file as of 02/14/2018.     Activities of Daily Living In your present state of health, do you have any difficulty performing the following activities: 02/14/2018 08/16/2017  Hearing? Y N  Comment difficulty understanding soft or whispered voices. -  Vision? N N  Difficulty concentrating or making decisions? Y N  Comment patient often misplaces items -  Walking or climbing stairs? N N  Dressing or bathing? N N  Doing errands, shopping? N N  Some recent data might be hidden    Patient Care Team: Mellody Dance, DO as PCP - General (Family Medicine)   Assessment:   This is a routine wellness examination for Larry Harris.  Exercise Activities and Dietary recommendations Current Exercise Habits: Home exercise routine, Type of exercise: walking, Time (Minutes): 30, Frequency (Times/Week): 7, Weekly Exercise (Minutes/Week): 210, Intensity: Mild  Goals    None      Fall Risk Fall Risk  02/14/2018 08/16/2017 07/25/2016  Falls in the past year? No Yes No  Number falls in past yr: - 1 -  Injury with Fall? - No -  Follow up - Falls evaluation completed -   Is the patient's home free of loose throw rugs in walkways, pet beds, electrical cords, etc?   no      Grab bars in the bathroom? yes      Handrails on the stairs?   yes      Adequate lighting?   yes  Timed Get Up and Go Performed: passed  Depression Screen PHQ 2/9 Scores 02/14/2018 10/23/2017 08/16/2017 07/25/2016  PHQ - 2 Score 0 1 0 0  PHQ- 9 Score 0 2 1 -    Cognitive Function MMSE -  Mini Mental State Exam 08/01/2016  Orientation to time 5  Orientation to Place 5  Registration 3  Attention/ Calculation 5  Recall 3  Language- name 2 objects 2  Language- repeat 1  Language- follow 3 step command 3  Language- read & follow direction 1  Write a sentence 1  Copy design 1  Total score 30     6CIT Screen 02/14/2018 08/16/2017  What Year? 0  points 0 points  What month? 0 points 0 points  What time? 0 points 0 points  Count back from 20 0 points 0 points  Months in reverse 0 points 0 points  Repeat phrase 0 points 0 points  Total Score 0 0    Immunization History  Administered Date(s) Administered  . Influenza, High Dose Seasonal PF 06/20/2017  . Influenza-Unspecified 06/27/2015, 07/12/2016, 06/20/2017  . Pneumococcal Conjugate-13 08/25/2017  . Pneumococcal Polysaccharide-23 08/01/2016  . Tdap 08/01/2016  . Zoster 09/27/2011    Qualifies for Shingles Vaccine? [facility]  Screening Tests Health Maintenance  Topic Date Due  . INFLUENZA VACCINE  04/26/2018  . TETANUS/TDAP  08/01/2026  . PNA vac Low Risk Adult  Completed   Cancer Screenings: Lung: Low Dose CT Chest recommended if Age 66-80 years, 30 pack-year currently smoking OR have quit w/in 15years. Patient does not qualify. Colorectal: 06/30/2015  Additional Screenings:  Hepatitis C Screening: pt declines - pt declines meningitis vaccine states he is low risk. -He is going to go to the pharmacy to get his Shingrix / updated shingles vaccine.      Plan:   -We will send patient for AAA screen as he is never had a and has a 25-pack-year history of smoking quit back in the late 90s.  I have personally reviewed and noted the following in the patient's chart:   . Medical and social history . Use of alcohol, tobacco or illicit drugs  . Current medications and supplements . Functional ability and status . Nutritional status . Physical activity . Advanced directives . List of other physicians . Hospitalizations, surgeries, and ER visits in previous 12 months . Vitals . Screenings to include cognitive, depression, and falls . Referrals and appointments  In addition, I have reviewed and discussed with patient certain preventive protocols, quality metrics, and best practice recommendations. A written personalized care plan for  preventive services as well as general preventive health recommendations were provided to patient.     Mellody Dance, DO  02/14/2018

## 2018-02-27 ENCOUNTER — Ambulatory Visit: Payer: PPO

## 2018-03-07 ENCOUNTER — Ambulatory Visit
Admission: RE | Admit: 2018-03-07 | Discharge: 2018-03-07 | Disposition: A | Discharge status: Home / Self Care | Payer: PPO | Source: Ambulatory Visit | Attending: Family Medicine | Admitting: Family Medicine

## 2018-03-07 DIAGNOSIS — Z87891 Personal history of nicotine dependence: Secondary | ICD-10-CM | POA: Diagnosis not present

## 2018-03-07 DIAGNOSIS — Z136 Encounter for screening for cardiovascular disorders: Secondary | ICD-10-CM

## 2018-03-23 ENCOUNTER — Other Ambulatory Visit: Payer: Self-pay | Admitting: Family Medicine

## 2018-04-05 ENCOUNTER — Telehealth: Payer: Self-pay | Admitting: Family Medicine

## 2018-04-05 NOTE — Telephone Encounter (Signed)
Medication was refill was sent on 03/23/18 for 90 days.  Patient notified and will contact the pharmacy. MPulliam, CMA/RT(R)

## 2018-04-05 NOTE — Telephone Encounter (Signed)
Patient is requesting a refill of his gabapentin 300mg  to be sent [facility] [address]. Please advise

## 2018-04-24 ENCOUNTER — Telehealth: Payer: Self-pay | Admitting: Family Medicine

## 2018-04-24 ENCOUNTER — Other Ambulatory Visit: Payer: Self-pay

## 2018-04-24 MED ORDER — FLUTICASONE PROPIONATE 50 MCG/ACT NA SUSP: 2.0000 | Freq: Every day | NASAL | 1 refills | Status: DC

## 2018-04-24 NOTE — Telephone Encounter (Signed)
Patient came by the office to request refill on :  fluticasone (FLONASE) 50 MCG/ACT nasal spray [[id number]]   Order Details  Dose: 2 spray Route: Each Nare Frequency: Daily  Dispense Quantity: 16 g Refills: 6 Fills remaining: --        Sig: Place 2 sprays into both nostrils daily      Patient uses :   Preferred Pharmacies      [id number] [address], [place]. [phone] (Phone) [phone] (Fax)    Forwarding request to medical assistant.  ---glh

## 2018-04-24 NOTE — Telephone Encounter (Signed)
Refill on flonase. MPulliam, CMA/RT(R)

## 2018-04-24 NOTE — Telephone Encounter (Signed)
Sent to pharmacy. MPulliam, CMA/RT(R)

## 2018-05-02 ENCOUNTER — Other Ambulatory Visit: Payer: Self-pay

## 2018-05-02 ENCOUNTER — Telehealth: Payer: Self-pay | Admitting: Family Medicine

## 2018-05-02 DIAGNOSIS — N400 Enlarged prostate without lower urinary tract symptoms: Secondary | ICD-10-CM

## 2018-05-02 MED ORDER — DOXAZOSIN MESYLATE 2 MG PO TABS: 2.0000 mg | ORAL_TABLET | Freq: Every day | ORAL | 1 refills | Status: DC

## 2018-05-02 NOTE — Telephone Encounter (Signed)
Patient requesting refill on doxazosin.  Reviewed chart and sent in refill per office policy. MPulliam, CMA/RT(R)

## 2018-05-02 NOTE — Telephone Encounter (Signed)
Patient is requesting a refill of his doxazosin, if approved please send to [place] [facility]

## 2018-05-02 NOTE — Telephone Encounter (Signed)
Refill sent into the pharmacy, patient notified. MPulliam, CMA/RT(R)  

## 2018-05-10 DIAGNOSIS — H26493 Other secondary cataract, bilateral: Secondary | ICD-10-CM | POA: Diagnosis not present

## 2018-05-17 ENCOUNTER — Other Ambulatory Visit: Payer: Self-pay | Admitting: Family Medicine

## 2018-06-19 DIAGNOSIS — H26493 Other secondary cataract, bilateral: Secondary | ICD-10-CM | POA: Diagnosis not present

## 2018-06-28 ENCOUNTER — Other Ambulatory Visit: Payer: Self-pay | Admitting: Family Medicine

## 2018-07-19 DIAGNOSIS — D0462 Carcinoma in situ of skin of left upper limb, including shoulder: Secondary | ICD-10-CM | POA: Diagnosis not present

## 2018-07-19 DIAGNOSIS — D485 Neoplasm of uncertain behavior of skin: Secondary | ICD-10-CM | POA: Diagnosis not present

## 2018-07-19 DIAGNOSIS — D044 Carcinoma in situ of skin of scalp and neck: Secondary | ICD-10-CM | POA: Diagnosis not present

## 2018-07-24 ENCOUNTER — Other Ambulatory Visit: Payer: Self-pay | Admitting: Family Medicine

## 2018-07-24 MED ORDER — FLUTICASONE PROPIONATE 50 MCG/ACT NA SUSP: NASAL | 1 refills | Status: DC

## 2018-07-24 NOTE — Telephone Encounter (Signed)
Patient came by office to request  Rx refill on :    fluticasone (FLONASE) 50 MCG/ACT nasal spray [[id number]]   Order Details  Dose, Route, Frequency: As Directed   Dispense Quantity: 16 g Refills: 1 Fills remaining: --        Sig: SPRAY 2 SPRAYS INTO EACH NOSTRIL EVERY DAY     [facility] uses:   [id number] [address], [place]. [phone] (Phone) [phone] (Fax)   --Forwarding request to medical assistant.  --Dion Body

## 2018-08-03 DIAGNOSIS — D0462 Carcinoma in situ of skin of left upper limb, including shoulder: Secondary | ICD-10-CM | POA: Diagnosis not present

## 2018-08-16 ENCOUNTER — Other Ambulatory Visit: Payer: PPO

## 2018-08-16 ENCOUNTER — Other Ambulatory Visit: Payer: Self-pay

## 2018-08-16 DIAGNOSIS — R7989 Other specified abnormal findings of blood chemistry: Secondary | ICD-10-CM

## 2018-08-16 DIAGNOSIS — D696 Thrombocytopenia, unspecified: Secondary | ICD-10-CM

## 2018-08-16 DIAGNOSIS — Z0000 Encounter for general adult medical examination without abnormal findings: Secondary | ICD-10-CM

## 2018-08-17 LAB — COMPREHENSIVE METABOLIC PANEL
ALT: 16 IU/L (ref 0–44)
AST: 12 IU/L (ref 0–40)
Albumin/Globulin Ratio: 2.7 — ABNORMAL HIGH (ref 1.2–2.2)
Albumin: 3.5 g/dL (ref 3.5–4.8)
Alkaline Phosphatase: 54 IU/L (ref 39–117)
BUN/Creatinine Ratio: 18 (ref 10–24)
BUN: 16 mg/dL (ref 8–27)
Bilirubin Total: 0.5 mg/dL (ref 0.0–1.2)
CALCIUM: 8.6 mg/dL (ref 8.6–10.2)
CO2: 24 mmol/L (ref 20–29)
CREATININE: 0.88 mg/dL (ref 0.76–1.27)
Chloride: 106 mmol/L (ref 96–106)
GFR, EST AFRICAN AMERICAN: 94 mL/min/{1.73_m2} (ref >59)
GFR, EST NON AFRICAN AMERICAN: 82 mL/min/{1.73_m2} (ref >59)
Globulin, Total: 1.3 g/dL — ABNORMAL LOW (ref 1.5–4.5)
Glucose: 94 mg/dL (ref 65–99)
Potassium: 4 mmol/L (ref 3.5–5.2)
Sodium: 142 mmol/L (ref 134–144)
TOTAL PROTEIN: 4.8 g/dL — AB (ref 6.0–8.5)

## 2018-08-17 LAB — LIPID PANEL
CHOLESTEROL TOTAL: 168 mg/dL (ref 100–199)
Chol/HDL Ratio: 3.6 ratio (ref 0.0–5.0)
HDL: 47 mg/dL (ref >39)
LDL Calculated: 109 mg/dL — ABNORMAL HIGH (ref 0–99)
Triglycerides: 61 mg/dL (ref 0–149)
VLDL Cholesterol Cal: 12 mg/dL (ref 5–40)

## 2018-08-17 LAB — CBC WITH DIFFERENTIAL/PLATELET
BASOS: 1 %
Basophils Absolute: 0.1 10*3/uL (ref 0.0–0.2)
EOS (ABSOLUTE): 0.2 10*3/uL (ref 0.0–0.4)
EOS: 3 %
HEMATOCRIT: 42.5 % (ref 37.5–51.0)
HEMOGLOBIN: 14.4 g/dL (ref 13.0–17.7)
IMMATURE GRANS (ABS): 0 10*3/uL (ref 0.0–0.1)
IMMATURE GRANULOCYTES: 1 %
LYMPHS: 29 %
Lymphocytes Absolute: 1.9 10*3/uL (ref 0.7–3.1)
MCH: 31.9 pg (ref 26.6–33.0)
MCHC: 33.9 g/dL (ref 31.5–35.7)
MCV: 94 fL (ref 79–97)
MONOCYTES: 9 %
Monocytes Absolute: 0.6 10*3/uL (ref 0.1–0.9)
NEUTROS PCT: 57 %
Neutrophils Absolute: 3.8 10*3/uL (ref 1.4–7.0)
Platelets: 108 10*3/uL — ABNORMAL LOW (ref 150–450)
RBC: 4.51 x10E6/uL (ref 4.14–5.80)
RDW: 12.9 % (ref 12.3–15.4)
WBC: 6.5 10*3/uL (ref 3.4–10.8)

## 2018-08-17 LAB — VITAMIN D 25 HYDROXY (VIT D DEFICIENCY, FRACTURES): VIT D 25 HYDROXY: 70.8 ng/mL (ref 30.0–100.0)

## 2018-08-17 LAB — HEMOGLOBIN A1C
ESTIMATED AVERAGE GLUCOSE: 111 mg/dL
Hgb A1c MFr Bld: 5.5 % (ref 4.8–5.6)

## 2018-08-17 LAB — T4, FREE: Free T4: 1.15 ng/dL (ref 0.82–1.77)

## 2018-08-17 LAB — TSH: TSH: 2.44 u[IU]/mL (ref 0.450–4.500)

## 2018-08-27 ENCOUNTER — Ambulatory Visit (INDEPENDENT_AMBULATORY_CARE_PROVIDER_SITE_OTHER): Payer: PPO | Admitting: Family Medicine

## 2018-08-27 ENCOUNTER — Encounter: Payer: Self-pay | Admitting: Family Medicine

## 2018-08-27 VITALS — BP 130/80 | HR 69 | Temp 97.7°F | Ht 70.25 in | Wt 195.0 lb

## 2018-08-27 DIAGNOSIS — N400 Enlarged prostate without lower urinary tract symptoms: Secondary | ICD-10-CM | POA: Diagnosis not present

## 2018-08-27 DIAGNOSIS — Z719 Counseling, unspecified: Secondary | ICD-10-CM

## 2018-08-27 DIAGNOSIS — M48061 Spinal stenosis, lumbar region without neurogenic claudication: Secondary | ICD-10-CM

## 2018-08-27 DIAGNOSIS — Z0000 Encounter for general adult medical examination without abnormal findings: Secondary | ICD-10-CM

## 2018-08-27 NOTE — Progress Notes (Addendum)
Subjective:   Larry Harris is a 76 y.o. male who presents for Medicare Annual/Subsequent preventive examination.  Subjective:    CC: CPE  HPI: Larry Harris is a 77 y.o. male who presents to [place] [facility] today for annual medicare wellness exam.  Health Maintenance Summary Reviewed and updated, unless pt declines services.  Colonoscopy:  Last colonoscopy in 2016.  Was normal. Tobacco History Reviewed:  Former smoker, quit in 1995, 1 ppd, 25 pack-years. CT scan for screening lung CA:  Does not meet recommendations. Abdominal Ultrasound:  Has had his AAA screen in June 2019. Alcohol:   No concerns, no excessive use Exercise Habits:  Walks approximately a half mile per day.  Remarks "I enjoy life."  He hangs on to bars in the shower.  Notes "I don't think I've fallen in the past year," but states "I cannot stand on one leg and I cannot stand on my tiptoes, and I can't run."  Says "I can't put anything on the ball of my feet."  Sees Dr. Rock Nephew who is a neurologist with [facility]; started with Dr. Erling Cruz who retired.  Patient states he was released from follow up and has not had an injection in his back in a while.  Has trouble hearing people that talk too fast or talk too softly.  States "I don't think I'm in a hearing aid situation."  Notes that one ear hears better than the other.  States "it's not bothersome."  Feels safe at home.  States that his last vision screen was in August of 2019.    6CIT Screen 08/27/2018 02/14/2018 08/16/2017  What Year? 0 points 0 points 0 points  What month? 0 points 0 points 0 points  What time? 0 points 0 points 0 points  Count back from 20 0 points 0 points 0 points  Months in reverse 0 points 0 points 0 points  Repeat phrase 0 points 0 points 0 points  Total Score 0 0 0    Depression screen [facility] 2/9 08/27/2018 02/14/2018 10/23/2017 08/16/2017 07/25/2016  Decreased Interest 0 0 1 0 0  Down,  Depressed, Hopeless 0 0 0 0 0  PHQ - 2 Score 0 0 1 0 0  Altered sleeping 0 0 0 0 -  Tired, decreased energy 0 0 1 1 -  Change in appetite 0 0 0 0 -  Feeling bad or failure about yourself  0 0 0 0 -  Trouble concentrating 0 0 0 0 -  Moving slowly or fidgety/restless 0 0 0 0 -  Suicidal thoughts 0 0 0 0 -  PHQ-9 Score 0 0 2 1 -  Difficult doing work/chores Not difficult at all Not difficult at all Somewhat difficult Not difficult at all -   Functional Status Survey: Is the patient deaf or have difficulty hearing?: Yes(Has to ask people to repeat if low or whispered voices) Does the patient have difficulty seeing, even when wearing glasses/contacts?: No Does the patient have difficulty concentrating, remembering, or making decisions?: No Does the patient have difficulty walking or climbing stairs?: No Does the patient have difficulty dressing or bathing?: No Does the patient have difficulty doing errands alone such as visiting a doctor's office or shopping?: No    Current Exercise Habits: Home exercise routine, Type of exercise: walking, Frequency (Times/Week): 7, Intensity: Moderate   Activities of Daily Living In your present state of health, do you have difficulty performing the following activities?  1- Driving - no 2- Managing money - no 3- Feeding yourself - no 4- Getting from the bed to the chair - no 5- Climbing a flight of stairs - yes patient states that he has to hold on to rail if large flight of stairs 6- Preparing food and eating - no 7- Bathing or showering - no 8- Getting dressed - no 9- Getting to the toilet - no 10- Using the toilet - no 11- Moving around from place to place - no Patient states that he feels safe at home   Review of Systems: General:   Denies fever, chills, unexplained weight loss.  Optho/Auditory:   Denies visual changes, blurred vision/LOV Respiratory:   Denies SOB, DOE more than baseline levels.  Cardiovascular:   Denies chest pain,  palpitations, new onset peripheral edema  Gastrointestinal:   Denies nausea, vomiting, diarrhea.  Genitourinary: Denies dysuria, freq/ urgency, flank pain or discharge from genitals.  Endocrine:     Denies hot or cold intolerance, polyuria, polydipsia. Musculoskeletal:   Denies unexplained myalgias, joint swelling, unexplained arthralgias, gait problems.  Skin:  Denies rash, suspicious lesions Neurological:     Denies dizziness, unexplained weakness, numbness  Psychiatric/Behavioral:   Denies mood changes, suicidal or homicidal ideations, hallucinations     Objective:    Vitals: BP 130/80   Pulse 69   Temp 97.7 F (36.5 C)   Ht 5' 10.25" (1.784 m)   Wt 195 lb (88.5 kg)   SpO2 99%   BMI 27.78 kg/m   Body mass index is 27.78 kg/m.  Advanced Directives 02/15/2016 06/10/2015 06/08/2015  Does Patient Have a Medical Advance Directive? Yes Yes Yes  Type of Advance Directive Living will [place];Living will Living will  Does patient want to make changes to medical advance directive? No - Patient declined - No - Patient declined  Copy of [place] in Chart? No - copy requested - No - copy requested    Tobacco Social History   Tobacco Use  Smoking Status Former Smoker  . Packs/day: 1.00  . Years: 25.00  . Pack years: 25.00  . Types: Cigarettes  . Last attempt to quit: 07/27/1994  . Years since quitting: 24.1  Smokeless Tobacco Never Used     Counseling given: Not Answered   Clinical Intake:                       Past Medical History:  Diagnosis Date  . Acute rhinitis   . Allergic rhinitis, cause unspecified   . Allergy   . BPH (benign prostatic hyperplasia)   . CAD (coronary artery disease)   . Cancer ([place])    scalp basal cell skin ca  . Cataract    bilateral cateracts removed  . Clotting disorder ([place])   . Hypercholesteremia   . Hypotension   . Neuralgia, neuritis, and radiculitis, unspecified   . Osteoarthritis    . Spinal stenosis of lumbar region 06/04/2014  . Thrombocytopenia, unspecified ([place])    Past Surgical History:  Procedure Laterality Date  . APPENDECTOMY  1958  . bilateral cateracts removed    . COLONOSCOPY    . HERNIA REPAIR  1965   x2   Family History  Problem Relation Age of Onset  . Stroke Mother 59  . Prostate cancer Father 87  . Heart failure Brother 91  . Colon cancer Neg Hx   . Esophageal cancer Neg Hx   . Rectal cancer Neg  Hx   . Stomach cancer Neg Hx    Social History   Socioeconomic History  . Marital status: Married    Spouse name: Not on file  . Number of children: 2  . Years of education: college 4  . Highest education level: Not on file  Occupational History  . Occupation: Retired  Scientific laboratory technician  . Financial resource strain: Not on file  . Food insecurity:    Worry: Not on file    Inability: Not on file  . Transportation needs:    Medical: Not on file    Non-medical: Not on file  Tobacco Use  . Smoking status: Former Smoker    Packs/day: 1.00    Years: 25.00    Pack years: 25.00    Types: Cigarettes    Last attempt to quit: 07/27/1994    Years since quitting: 24.1  . Smokeless tobacco: Never Used  Substance and Sexual Activity  . Alcohol use: Yes    Alcohol/week: 1.0 standard drinks    Types: 1 Glasses of wine per week    Comment: occasional  . Drug use: No  . Sexual activity: Not on file  Lifestyle  . Physical activity:    Days per week: Not on file    Minutes per session: Not on file  . Stress: Not on file  Relationships  . Social connections:    Talks on phone: Not on file    Gets together: Not on file    Attends religious service: Not on file    Active member of club or organization: Not on file    Attends meetings of clubs or organizations: Not on file    Relationship status: Not on file  Other Topics Concern  . Not on file  Social History Narrative   The patient is widowed. 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 He  has no history of alcohol use. He has previously done regular exercise until developing a back problem.      Patient drinks about 3-4 cups of coffee daily.   Patient is right handed.     Outpatient Encounter Medications as of 08/27/2018  Medication Sig  . aspirin 325 MG tablet Take 325 mg by mouth daily.    Marland Kitchen b complex vitamins tablet Take 1 tablet by mouth daily.  . calcium carbonate (OS-CAL) 600 MG TABS Take 600 mg by mouth daily.    . diphenhydrAMINE (BENADRYL) 25 mg capsule Take 25 mg by mouth as needed (insomnia).  Marland Kitchen doxazosin (CARDURA) 2 MG tablet Take 1 tablet (2 mg total) by mouth at bedtime.  . fluticasone (FLONASE) 50 MCG/ACT nasal spray SPRAY 2 SPRAYS INTO EACH NOSTRIL EVERY DAY  . gabapentin (NEURONTIN) 300 MG capsule TAKE 1 CAPSULE BY MOUTH EVERY MORNING AND 2 CAPSULES BY MOUTH BEFORE BEDTIME  . hydrocortisone (ANUSOL-HC) 25 MG suppository Place 1 suppository (25 mg total) rectally 2 (two) times daily.  . Multiple Vitamin (MULTIVITAMIN) capsule Take 1 capsule by mouth daily.    . naproxen sodium (ANAPROX) 220 MG tablet Take 220 mg by mouth 2 (two) times daily with a meal. Sciatic nerve pain  . vitamin B-12 (CYANOCOBALAMIN) 1000 MCG tablet Take 1,000 mcg by mouth daily.    Marland Kitchen VITAMIN D, CHOLECALCIFEROL, PO Take 1,000 Units by mouth daily.    No facility-administered encounter medications on file as of 08/27/2018.     Activities of Daily Living In your present state of health, do you have any difficulty performing  the following activities: 08/27/2018 02/14/2018  Hearing? Y Y  Comment Has to ask people to repeat if low or whispered voices difficulty understanding soft or whispered voices.  Vision? N N  Difficulty concentrating or making decisions? N Y  Comment - patient often misplaces items  Walking or climbing stairs? N N  Dressing or bathing? N N  Doing errands, shopping? N N  Some recent data might be hidden    Patient Care Team: Mellody Dance, DO as PCP - General  (Family Medicine)   Assessment:   This is a routine wellness examination for Jameal.  Exercise Activities and Dietary recommendations Current Exercise Habits: Home exercise routine, Type of exercise: walking, Frequency (Times/Week): 7, Intensity: Moderate  Goals   None     Fall Risk Fall Risk  08/27/2018 02/14/2018 08/16/2017 07/25/2016  Falls in the past year? 0 No Yes No  Number falls in past yr: - - 1 -  Injury with Fall? - - No -  Risk for fall due to : Impaired balance/gait - - -  Follow up Falls prevention discussed - Falls evaluation completed -   Is the patient's home free of loose throw rugs in walkways, pet beds, electrical cords, etc?   yes      Grab bars in the bathroom? yes      Handrails on the stairs?   yes      Adequate lighting?   yes  Timed Get Up and Go Performed: passed  Depression Screen PHQ 2/9 Scores 08/27/2018 02/14/2018 10/23/2017 08/16/2017  PHQ - 2 Score 0 0 1 0  PHQ- 9 Score 0 0 2 1    Cognitive Function MMSE - Mini Mental State Exam 08/01/2016  Orientation to time 5  Orientation to Place 5  Registration 3  Attention/ Calculation 5  Recall 3  Language- name 2 objects 2  Language- repeat 1  Language- follow 3 step command 3  Language- read & follow direction 1  Write a sentence 1  Copy design 1  Total score 30     6CIT Screen 08/27/2018 02/14/2018 08/16/2017  What Year? 0 points 0 points 0 points  What month? 0 points 0 points 0 points  What time? 0 points 0 points 0 points  Count back from 20 0 points 0 points 0 points  Months in reverse 0 points 0 points 0 points  Repeat phrase 0 points 0 points 0 points  Total Score 0 0 0    Immunization History  Administered Date(s) Administered  . Influenza, High Dose Seasonal PF 06/20/2017, 07/17/2018  . Influenza-Unspecified 06/27/2015, 07/12/2016, 06/20/2017, 07/17/2018  . Pneumococcal Conjugate-13 08/25/2017  . Pneumococcal Polysaccharide-23 08/01/2016  . Tdap 08/01/2016  . Zoster  09/27/2011  . Zoster Recombinat (Shingrix) 03/26/2018, 08/02/2018    Qualifies for Shingles Vaccine? Patient has had both injections this year.   Screening Tests Health Maintenance  Topic Date Due  . TETANUS/TDAP  08/01/2026  . INFLUENZA VACCINE  Completed  . PNA vac Low Risk Adult  Completed   Cancer Screenings: Lung: Low Dose CT Chest recommended if Age 95-80 years, 30 pack-year currently smoking OR have quit w/in 15years. Patient does not qualify. Colorectal: 06/30/2015  Additional Screenings:  declined      Plan:     Male physical  Impression and Recommendations:    1. Encounter for Medicare annual wellness exam     1) Anticipatory Guidance: Discussed importance of wearing a seatbelt while driving, not texting while driving;   sunscreen when  outside along with skin surveillance; eating a balanced and modest diet; physical activity at least 25 minutes per day or 150 min/ week moderate to intense activity.  2) Immunizations / Screenings / Labs: All immunizations are up-to-date per recommendations or will be updated today. Patient is due for dental and vision screens which pt will schedule independently. Will obtain CBC, CMP, HgA1c, Lipid panel, TSH and vit D when fasting, if not already done recently.   - Discussed reasons for Medicare Wellness visit at length with patient today, including need to evaluate screenings, immunizations, and other health recommendations.  - Follow-up AAA screen recommended in 5 years, 03/08/2023.  - Discussed that after age 9, patient has no need for further colonoscopies, especially since his last colonoscopy in 2016 was normal.  - Flu vaccine received 07/17/2018.  - Last pneumonia vaccine administered 2017-2018.  - Last shingles vaccine was administered July & November of 2019.  - Reviewed need for once-per-lifetime HIV/Hepatitis C screening with patient.  Patient declined.  - Patient knows to forward all records from other visits and  specialties here to clinic.  3) Weight & BMI Counseling BMI meaning discussed with patient.  Discussed goal of losing 5-10% of current body weight which would improve overall feelings of well being and improve objective health data. Improve nutrient density of diet through increasing intake of fruits and vegetables and decreasing saturated fats, white flour products and refined sugars.   - Per discussion in office today, patient would like to try to lose 5-10 lbs in the next 6 months.   Gross side effects, risk and benefits, and alternatives of medications discussed with patient.  Patient is aware that all medications have potential side effects and we are unable to predict every side effect or drug-drug interaction that may occur.  Expresses verbal understanding and consents to current therapy plan and treatment regimen.  Please see AVS handed out to patient at the end of our visit for further patient instructions/ counseling done pertaining to today's office visit.  Follow-up preventative CPE in 1 year. Follow-up office visit pending lab work.  F/up sooner for chronic care management and/or prn  I have personally reviewed and noted the following in the patient's chart:   . Medical and social history . Use of alcohol, tobacco or illicit drugs  . Current medications and supplements . Functional ability and status . Nutritional status . Physical activity . Advanced directives . List of other physicians . Hospitalizations, surgeries, and ER visits in previous 12 months . Vitals . Screenings to include cognitive, depression, and falls . Referrals and appointments  In addition, I have reviewed and discussed with patient certain preventive protocols, quality metrics, and best practice recommendations. A written personalized care plan for preventive services as well as general preventive health recommendations were provided to patient.

## 2018-08-27 NOTE — Patient Instructions (Signed)
Goal is to lose 5 to 10 pounds by next office visit in 6 months.    Preventive Care for Adults, Male A healthy lifestyle and preventive care can promote health and wellness. Preventive health guidelines for men include the following key practices:  A routine yearly physical is a good way to check with your health care provider about your health and preventative screening. It is a chance to share any concerns and updates on your health and to receive a thorough exam.  Visit your dentist for a routine exam and preventative care every 6 months. Brush your teeth twice a day and floss once a day. Good oral hygiene prevents tooth decay and gum disease.  The frequency of eye exams is based on your age, health, family medical history, use of contact lenses, and other factors. Follow your health care provider's recommendations for frequency of eye exams.  Eat a healthy diet. Foods such as vegetables, fruits, whole grains, low-fat dairy products, and lean protein foods contain the nutrients you need without too many calories. Decrease your intake of foods high in solid fats, added sugars, and salt. Eat the right amount of calories for you.Get information about a proper diet from your health care provider, if necessary.  Regular physical exercise is one of the most important things you can do for your health. Most adults should get at least 150 minutes of moderate-intensity exercise (any activity that increases your heart rate and causes you to sweat) each week. In addition, most adults need muscle-strengthening exercises on 2 or more days a week.  Maintain a healthy weight. The body mass index (BMI) is a screening tool to identify possible weight problems. It provides an estimate of body fat based on height and weight. Your health care provider can find your BMI and can help you achieve or maintain a healthy weight.For adults 20 years and older:  A BMI below 18.5 is considered underweight.  A BMI of  18.5 to 24.9 is normal.  A BMI of 25 to 29.9 is considered overweight.  A BMI of 30 and above is considered obese.  Maintain normal blood lipids and cholesterol levels by exercising and minimizing your intake of saturated fat. Eat a balanced diet with plenty of fruit and vegetables. Blood tests for lipids and cholesterol should begin at age 59 and be repeated every 5 years. If your lipid or cholesterol levels are high, you are over 50, or you are at high risk for heart disease, you may need your cholesterol levels checked more frequently.Ongoing high lipid and cholesterol levels should be treated with medicines if diet and exercise are not working.  If you smoke, find out from your health care provider how to quit. If you do not use tobacco, do not start.  Lung cancer screening is recommended for adults aged 40-80 years who are at high risk for developing lung cancer because of a history of smoking. A yearly low-dose CT scan of the lungs is recommended for people who have at least a 30-pack-year history of smoking and are a current smoker or have quit within the past 15 years. A pack year of smoking is smoking an average of 1 pack of cigarettes a day for 1 year (for example: 1 pack a day for 30 years or 2 packs a day for 15 years). Yearly screening should continue until the smoker has stopped smoking for at least 15 years. Yearly screening should be stopped for people who develop a health problem that would  prevent them from having lung cancer treatment.  If you choose to drink alcohol, do not have more than 2 drinks per day. One drink is considered to be 12 ounces (355 mL) of beer, 5 ounces (148 mL) of wine, or 1.5 ounces (44 mL) of liquor.  Avoid use of street drugs. Do not share needles with anyone. Ask for help if you need support or instructions about stopping the use of drugs.  High blood pressure causes heart disease and increases the risk of stroke. Your blood pressure should be checked at  least every 1-2 years. Ongoing high blood pressure should be treated with medicines, if weight loss and exercise are not effective.  If you are 107-59 years old, ask your health care provider if you should take aspirin to prevent heart disease.  Diabetes screening is done by taking a blood sample to check your blood glucose level after you have not eaten for a certain period of time (fasting). If you are not overweight and you do not have risk factors for diabetes, you should be screened once every 3 years starting at age 26. If you are overweight or obese and you are 46-101 years of age, you should be screened for diabetes every year as part of your cardiovascular risk assessment.  Colorectal cancer can be detected and often prevented. Most routine colorectal cancer screening begins at the age of 44 and continues through age 4. However, your health care provider may recommend screening at an earlier age if you have risk factors for colon cancer. On a yearly basis, your health care provider may provide home test kits to check for hidden blood in the stool. Use of a small camera at the end of a tube to directly examine the colon (sigmoidoscopy or colonoscopy) can detect the earliest forms of colorectal cancer. Talk to your health care provider about this at age 56, when routine screening begins. Direct exam of the colon should be repeated every 5-10 years through age 35, unless early forms of precancerous polyps or small growths are found.  People who are at an increased risk for hepatitis B should be screened for this virus. You are considered at high risk for hepatitis B if:  You were born in a country where hepatitis B occurs often. Talk with your health care provider about which countries are considered high risk.  Your parents were born in a high-risk country and you have not received a shot to protect against hepatitis B (hepatitis B vaccine).  You have HIV or AIDS.  You use needles to inject  street drugs.  You live with, or have sex with, someone who has hepatitis B.  You are a man who has sex with other men (MSM).  You get hemodialysis treatment.  You take certain medicines for conditions such as cancer, organ transplantation, and autoimmune conditions.  Hepatitis C blood testing is recommended for all people born from [birth year] through 1965 and any individual with known risks for hepatitis C.  Practice safe sex. Use condoms and avoid high-risk sexual practices to reduce the spread of sexually transmitted infections (STIs). STIs include gonorrhea, chlamydia, syphilis, trichomonas, herpes, HPV, and human immunodeficiency virus (HIV). Herpes, HIV, and HPV are viral illnesses that have no cure. They can result in disability, cancer, and death.  If you are a man who has sex with other men, you should be screened at least once per year for:  HIV.  Urethral, rectal, and pharyngeal infection of gonorrhea, chlamydia, or both.  If you are at risk of being infected with HIV, it is recommended that you take a prescription medicine daily to prevent HIV infection. This is called preexposure prophylaxis (PrEP). You are considered at risk if:  You are a man who has sex with other men (MSM) and have other risk factors.  You are a heterosexual man, are sexually active, and are at increased risk for HIV infection.  You take drugs by injection.  You are sexually active with a partner who has HIV.  Talk with your health care provider about whether you are at high risk of being infected with HIV. If you choose to begin PrEP, you should first be tested for HIV. You should then be tested every 3 months for as long as you are taking PrEP.  A one-time screening for abdominal aortic aneurysm (AAA) and surgical repair of large AAAs by ultrasound are recommended for men ages 87 to 39 years who are current or former smokers.  Healthy men should no longer receive prostate-specific antigen (PSA) blood  tests as part of routine cancer screening. Talk with your health care provider about prostate cancer screening.  Testicular cancer screening is not recommended for adult males who have no symptoms. Screening includes self-exam, a health care provider exam, and other screening tests. Consult with your health care provider about any symptoms you have or any concerns you have about testicular cancer.  Use sunscreen. Apply sunscreen liberally and repeatedly throughout the day. You should seek shade when your shadow is shorter than you. Protect yourself by wearing long sleeves, pants, a wide-brimmed hat, and sunglasses year round, whenever you are outdoors.  Once a month, do a whole-body skin exam, using a mirror to look at the skin on your back. Tell your health care provider about new moles, moles that have irregular borders, moles that are larger than a pencil eraser, or moles that have changed in shape or color.  Stay current with required vaccines (immunizations).  Influenza vaccine. All adults should be immunized every year.  Tetanus, diphtheria, and acellular pertussis (Td, Tdap) vaccine. An adult who has not previously received Tdap or who does not know his vaccine status should receive 1 dose of Tdap. This initial dose should be followed by tetanus and diphtheria toxoids (Td) booster doses every 10 years. Adults with an unknown or incomplete history of completing a 3-dose immunization series with Td-containing vaccines should begin or complete a primary immunization series including a Tdap dose. Adults should receive a Td booster every 10 years.  Varicella vaccine. An adult without evidence of immunity to varicella should receive 2 doses or a second dose if he has previously received 1 dose.  Human papillomavirus (HPV) vaccine. Males aged 11-21 years who have not received the vaccine previously should receive the 3-dose series. Males aged 22-26 years may be immunized. Immunization is recommended  through the age of 63 years for any male who has sex with males and did not get any or all doses earlier. Immunization is recommended for any person with an immunocompromised condition through the age of 71 years if he did not get any or all doses earlier. During the 3-dose series, the second dose should be obtained 4-8 weeks after the first dose. The third dose should be obtained 24 weeks after the first dose and 16 weeks after the second dose.  Zoster vaccine. One dose is recommended for adults aged 75 years or older unless certain conditions are present.  Measles, mumps, and rubella (MMR)  vaccine. Adults born before [birth year] generally are considered immune to measles and mumps. Adults born in [birth year] or later should have 1 or more doses of MMR vaccine unless there is a contraindication to the vaccine or there is laboratory evidence of immunity to each of the three diseases. A routine second dose of MMR vaccine should be obtained at least 28 days after the first dose for students attending postsecondary schools, health care workers, or international travelers. People who received inactivated measles vaccine or an unknown type of measles vaccine during 1963-1967 should receive 2 doses of MMR vaccine. People who received inactivated mumps vaccine or an unknown type of mumps vaccine before 1979 and are at high risk for mumps infection should consider immunization with 2 doses of MMR vaccine. Unvaccinated health care workers born before [birth year] who lack laboratory evidence of measles, mumps, or rubella immunity or laboratory confirmation of disease should consider measles and mumps immunization with 2 doses of MMR vaccine or rubella immunization with 1 dose of MMR vaccine.  Pneumococcal 13-valent conjugate (PCV13) vaccine. When indicated, a person who is uncertain of his immunization history and has no record of immunization should receive the PCV13 vaccine. All adults 36 years of age and older should receive this  vaccine. An adult aged 68 years or older who has certain medical conditions and has not been previously immunized should receive 1 dose of PCV13 vaccine. This PCV13 should be followed with a dose of pneumococcal polysaccharide (PPSV23) vaccine. Adults who are at high risk for pneumococcal disease should obtain the PPSV23 vaccine at least 8 weeks after the dose of PCV13 vaccine. Adults older than 79 years of age who have normal immune system function should obtain the PPSV23 vaccine dose at least 1 year after the dose of PCV13 vaccine.  Pneumococcal polysaccharide (PPSV23) vaccine. When PCV13 is also indicated, PCV13 should be obtained first. All adults aged 81 years and older should be immunized. An adult younger than age 13 years who has certain medical conditions should be immunized. Any person who resides in a nursing home or long-term care facility should be immunized. An adult smoker should be immunized. People with an immunocompromised condition and certain other conditions should receive both PCV13 and PPSV23 vaccines. People with human immunodeficiency virus (HIV) infection should be immunized as soon as possible after diagnosis. Immunization during chemotherapy or radiation therapy should be avoided. Routine use of PPSV23 vaccine is not recommended for American Indians, [place] Natives, or people younger than 65 years unless there are medical conditions that require PPSV23 vaccine. When indicated, people who have unknown immunization and have no record of immunization should receive PPSV23 vaccine. One-time revaccination 5 years after the first dose of PPSV23 is recommended for people aged 19-64 years who have chronic kidney failure, nephrotic syndrome, asplenia, or immunocompromised conditions. People who received 1-2 doses of PPSV23 before age 57 years should receive another dose of PPSV23 vaccine at age 62 years or later if at least 5 years have passed since the previous dose. Doses of PPSV23 are not  needed for people immunized with PPSV23 at or after age 23 years.  Meningococcal vaccine. Adults with asplenia or persistent complement component deficiencies should receive 2 doses of quadrivalent meningococcal conjugate (MenACWY-D) vaccine. The doses should be obtained at least 2 months apart. Microbiologists working with certain meningococcal bacteria, [place] recruits, people at risk during an outbreak, and people who travel to or live in countries with a high rate of meningitis should be immunized. A first-year  college student up through age 37 years who is living in a residence hall should receive a dose if he did not receive a dose on or after his 16th birthday. Adults who have certain high-risk conditions should receive one or more doses of vaccine.  Hepatitis A vaccine. Adults who wish to be protected from this disease, have chronic liver disease, work with hepatitis A-infected animals, work in hepatitis A research labs, or travel to or work in countries with a high rate of hepatitis A should be immunized. Adults who were previously unvaccinated and who anticipate close contact with an international adoptee during the first 60 days after arrival in the [place] from a country with a high rate of hepatitis A should be immunized.  Hepatitis B vaccine. Adults should be immunized if they wish to be protected from this disease, are under age 56 years and have diabetes, have chronic liver disease, have had more than one sex partner in the past 6 months, may be exposed to blood or other infectious body fluids, are household contacts or sex partners of hepatitis B positive people, are clients or workers in certain care facilities, or travel to or work in countries with a high rate of hepatitis B.  Haemophilus influenzae type b (Hib) vaccine. A previously unvaccinated person with asplenia or sickle cell disease or having a scheduled splenectomy should receive 1 dose of Hib vaccine. Regardless of  previous immunization, a recipient of a hematopoietic stem cell transplant should receive a 3-dose series 6-12 months after his successful transplant. Hib vaccine is not recommended for adults with HIV infection. Preventive Service / Frequency Ages 50 to 79  Blood pressure check.** / Every 3-5 years.  Lipid and cholesterol check.** / Every 5 years beginning at age 67.  Hepatitis C blood test.** / For any individual with known risks for hepatitis C.  Skin self-exam. / Monthly.  Influenza vaccine. / Every year.  Tetanus, diphtheria, and acellular pertussis (Tdap, Td) vaccine.** / Consult your health care provider. 1 dose of Td every 10 years.  Varicella vaccine.** / Consult your health care provider.  HPV vaccine. / 3 doses over 6 months, if 66 or younger.  Measles, mumps, rubella (MMR) vaccine.** / You need at least 1 dose of MMR if you were born in 1957 or later. You may also need a second dose.  Pneumococcal 13-valent conjugate (PCV13) vaccine.** / Consult your health care provider.  Pneumococcal polysaccharide (PPSV23) vaccine.** / 1 to 2 doses if you smoke cigarettes or if you have certain conditions.  Meningococcal vaccine.** / 1 dose if you are age 86 to 65 years and a Market researcher living in a residence hall, or have one of several medical conditions. You may also need additional booster doses.  Hepatitis A vaccine.** / Consult your health care provider.  Hepatitis B vaccine.** / Consult your health care provider.  Haemophilus influenzae type b (Hib) vaccine.** / Consult your health care provider. Ages 24 to 44  Blood pressure check.** / Every year.  Lipid and cholesterol check.** / Every 5 years beginning at age 40.  Lung cancer screening. / Every year if you are aged 52-80 years and have a 30-pack-year history of smoking and currently smoke or have quit within the past 15 years. Yearly screening is stopped once you have quit smoking for at least 15 years or  develop a health problem that would prevent you from having lung cancer treatment.  Fecal occult blood test (FOBT) of stool. /  Every year beginning at age 3 and continuing until age 69. You may not have to do this test if you get a colonoscopy every 10 years.  Flexible sigmoidoscopy** or colonoscopy.** / Every 5 years for a flexible sigmoidoscopy or every 10 years for a colonoscopy beginning at age 68 and continuing until age 46.  Hepatitis C blood test.** / For all people born from [birth year] through 1965 and any individual with known risks for hepatitis C.  Skin self-exam. / Monthly.  Influenza vaccine. / Every year.  Tetanus, diphtheria, and acellular pertussis (Tdap/Td) vaccine.** / Consult your health care provider. 1 dose of Td every 10 years.  Varicella vaccine.** / Consult your health care provider.  Zoster vaccine.** / 1 dose for adults aged 1 years or older.  Measles, mumps, rubella (MMR) vaccine.** / You need at least 1 dose of MMR if you were born in 1959 or later. You may also need a second dose.  Pneumococcal 13-valent conjugate (PCV13) vaccine.** / Consult your health care provider.  Pneumococcal polysaccharide (PPSV23) vaccine.** / 1 to 2 doses if you smoke cigarettes or if you have certain conditions.  Meningococcal vaccine.** / Consult your health care provider.  Hepatitis A vaccine.** / Consult your health care provider.  Hepatitis B vaccine.** / Consult your health care provider.  Haemophilus influenzae type b (Hib) vaccine.** / Consult your health care provider. Ages 31 and over  Blood pressure check.** / Every year.  Lipid and cholesterol check.**/ Every 5 years beginning at age 79.  Lung cancer screening. / Every year if you are aged 21-80 years and have a 30-pack-year history of smoking and currently smoke or have quit within the past 15 years. Yearly screening is stopped once you have quit smoking for at least 15 years or develop a health problem that would  prevent you from having lung cancer treatment.  Fecal occult blood test (FOBT) of stool. / Every year beginning at age 73 and continuing until age 33. You may not have to do this test if you get a colonoscopy every 10 years.  Flexible sigmoidoscopy** or colonoscopy.** / Every 5 years for a flexible sigmoidoscopy or every 10 years for a colonoscopy beginning at age 74 and continuing until age 42.  Hepatitis C blood test.** / For all people born from [birth year] through 1967 and any individual with known risks for hepatitis C.  Abdominal aortic aneurysm (AAA) screening.** / A one-time screening for ages 8 to 53 years who are current or former smokers.  Skin self-exam. / Monthly.  Influenza vaccine. / Every year.  Tetanus, diphtheria, and acellular pertussis (Tdap/Td) vaccine.** / 1 dose of Td every 10 years.  Varicella vaccine.** / Consult your health care provider.  Zoster vaccine.** / 1 dose for adults aged 84 years or older.  Pneumococcal 13-valent conjugate (PCV13) vaccine.** / 1 dose for all adults aged 73 years and older.  Pneumococcal polysaccharide (PPSV23) vaccine.** / 1 dose for all adults aged 44 years and older.  Meningococcal vaccine.** / Consult your health care provider.  Hepatitis A vaccine.** / Consult your health care provider.  Hepatitis B vaccine.** / Consult your health care provider.  Haemophilus influenzae type b (Hib) vaccine.** / Consult your health care provider. **Family history and personal history of risk and conditions may change your health care provider's recommendations.   This information is not intended to replace advice given to you by your health care provider. Make sure you discuss any questions you have with your health care  provider.   Document Released: 11/08/2001 Document Revised: 10/03/2014 Document Reviewed: 02/07/2011 Elsevier Interactive Patient Education [employer].

## 2018-09-04 ENCOUNTER — Ambulatory Visit (INDEPENDENT_AMBULATORY_CARE_PROVIDER_SITE_OTHER): Payer: PPO | Admitting: Adult Health

## 2018-09-04 ENCOUNTER — Encounter: Payer: Self-pay | Admitting: Adult Health

## 2018-09-04 VITALS — BP 129/69 | HR 88 | Temp 98.5°F | Ht 70.25 in | Wt 194.1 lb

## 2018-09-04 DIAGNOSIS — J029 Acute pharyngitis, unspecified: Secondary | ICD-10-CM | POA: Diagnosis not present

## 2018-09-04 MED ORDER — PREDNISONE 20 MG PO TABS: ORAL_TABLET | ORAL | 0 refills | Status: DC

## 2018-09-04 NOTE — Assessment & Plan Note (Addendum)
Continue to increase fluids. Continue OTC Sudafed and Rx Flonase as directed. Please take Prednisone as directed. If symptoms worsen by end of week, please call clinic and we will send in Augmentin.

## 2018-09-04 NOTE — Patient Instructions (Signed)

## 2018-09-04 NOTE — Progress Notes (Signed)
Subjective:    Patient ID: Larry Harris, male    DOB: Nov 19, 1940, 77 y.o.   MRN: [id number]  HPI:  [name] presents with occasional non-productive cough, clear nasal drainage, and sore throat (5/10, described as scratchy). He also reports waking up with night sweats this morning. He denies N/V/D He denies fever or being around anyone with known flu, strep, pneumonia, however he did have dinner with his brother over the weekend "who also had a scratchy throat". He has been using OTC Sudafed, Rx Flonase and he has been trying to increase fluids.  Patient Care Team    Relationship Specialty Notifications Start End  Mellody Dance, DO PCP - General Family Medicine  02/15/16     Patient Active Problem List   Diagnosis Date Noted  . Pharyngitis 09/04/2018  . Acute maxillary sinusitis 10/23/2017  . Injury of right elbow 05/02/2017  . Low platelet count- chronically 08/01/2016  . BPH (benign prostatic hyperplasia) 02/15/2016  . Overweight (BMI 25.0-29.9) 02/15/2016  . Spinal stenosis of lumbar region 06/04/2014     Past Medical History:  Diagnosis Date  . Acute rhinitis   . Allergic rhinitis, cause unspecified   . Allergy   . BPH (benign prostatic hyperplasia)   . CAD (coronary artery disease)   . Cancer ([place])    scalp basal cell skin ca  . Cataract    bilateral cateracts removed  . Clotting disorder ([place])   . Hypercholesteremia   . Hypotension   . Neuralgia, neuritis, and radiculitis, unspecified   . Osteoarthritis   . Spinal stenosis of lumbar region 06/04/2014  . Thrombocytopenia, unspecified ([place])      Past Surgical History:  Procedure Laterality Date  . APPENDECTOMY  1958  . bilateral cateracts removed    . COLONOSCOPY    . HERNIA REPAIR  1965   x2     Family History  Problem Relation Age of Onset  . Stroke Mother 57  . Prostate cancer Father 73  . Heart failure Brother 35  . Colon cancer Neg Hx   . Esophageal cancer Neg Hx   . Rectal cancer  Neg Hx   . Stomach cancer Neg Hx      Social History   Substance and Sexual Activity  Drug Use No     Social History   Substance and Sexual Activity  Alcohol Use Yes  . Alcohol/week: 1.0 standard drinks  . Types: 1 Glasses of wine per week   Comment: occasional     Social History   Tobacco Use  Smoking Status Former Smoker  . Packs/day: 1.00  . Years: 25.00  . Pack years: 25.00  . Types: Cigarettes  . Last attempt to quit: 07/27/1994  . Years since quitting: 24.1  Smokeless Tobacco Never Used     Outpatient Encounter Medications as of 09/04/2018  Medication Sig  . aspirin 325 MG tablet Take 325 mg by mouth daily.    Marland Kitchen b complex vitamins tablet Take 1 tablet by mouth daily.  . calcium carbonate (OS-CAL) 600 MG TABS Take 600 mg by mouth daily.    . diphenhydrAMINE (BENADRYL) 25 mg capsule Take 25 mg by mouth as needed (insomnia).  Marland Kitchen doxazosin (CARDURA) 2 MG tablet Take 1 tablet (2 mg total) by mouth at bedtime.  . fluticasone (FLONASE) 50 MCG/ACT nasal spray SPRAY 2 SPRAYS INTO EACH NOSTRIL EVERY DAY  . gabapentin (NEURONTIN) 300 MG capsule TAKE 1 CAPSULE BY MOUTH EVERY MORNING AND 2 CAPSULES BY MOUTH  BEFORE BEDTIME  . hydrocortisone (ANUSOL-HC) 25 MG suppository Place 1 suppository (25 mg total) rectally 2 (two) times daily.  . Multiple Vitamin (MULTIVITAMIN) capsule Take 1 capsule by mouth daily.    . naproxen sodium (ANAPROX) 220 MG tablet Take 220 mg by mouth 2 (two) times daily with a meal. Sciatic nerve pain  . vitamin B-12 (CYANOCOBALAMIN) 1000 MCG tablet Take 1,000 mcg by mouth daily.    Marland Kitchen VITAMIN D, CHOLECALCIFEROL, PO Take 1,000 Units by mouth daily.   . predniSONE (DELTASONE) 20 MG tablet 1 tab every 12 hrs for 3 days. 1 tab daily for 3 days   No facility-administered encounter medications on file as of 09/04/2018.     Allergies: Patient has no known allergies.  Body mass index is 27.65 kg/m.  Blood pressure 129/69, pulse 88, temperature 98.5 F  (36.9 C), temperature source Oral, height 5' 10.25" (1.784 m), weight 194 lb 1.6 oz (88 kg), SpO2 98 %. Review of Systems  Constitutional: Positive for diaphoresis and fatigue. Negative for activity change, appetite change, chills, fever and unexpected weight change.  HENT: Positive for congestion, postnasal drip, sinus pressure, sore throat and voice change. Negative for ear discharge and ear pain.   Eyes: Negative for visual disturbance.  Respiratory: Positive for cough. Negative for chest tightness, shortness of breath and wheezing.   Cardiovascular: Negative for chest pain, palpitations and leg swelling.  Gastrointestinal: Negative for abdominal distention, abdominal pain, blood in stool, constipation, diarrhea, nausea and vomiting.  Neurological: Negative for dizziness and headaches.  Hematological: Does not bruise/bleed easily.       Objective:   Physical Exam  Constitutional: He is oriented to person, place, and time. He appears well-developed and well-nourished. No distress.  HENT:  Head: Normocephalic and atraumatic.  Right Ear: External ear normal. Tympanic membrane is bulging. Tympanic membrane is not erythematous. No decreased hearing is noted.  Left Ear: External ear normal. Tympanic membrane is bulging. Tympanic membrane is not erythematous. No decreased hearing is noted.  Nose: Mucosal edema and rhinorrhea present. Right sinus exhibits no maxillary sinus tenderness and no frontal sinus tenderness. Left sinus exhibits no maxillary sinus tenderness and no frontal sinus tenderness.  Mouth/Throat: Uvula is midline and mucous membranes are normal. Posterior oropharyngeal edema and posterior oropharyngeal erythema present. No oropharyngeal exudate. Tonsils are 0 on the right. Tonsils are 0 on the left. No tonsillar exudate.  Eyes: Pupils are equal, round, and reactive to light. Conjunctivae and EOM are normal.  Neck: Normal range of motion. Neck supple.  Cardiovascular: Normal rate,  regular rhythm, normal heart sounds and intact distal pulses.  No murmur heard. Pulmonary/Chest: Effort normal and breath sounds normal. No stridor. No respiratory distress. He has no wheezes. He has no rales. He exhibits no tenderness.  Lymphadenopathy:    He has no cervical adenopathy.  Neurological: He is alert and oriented to person, place, and time.  Skin: Skin is warm and dry. Capillary refill takes less than 2 seconds. He is not diaphoretic. No erythema. No pallor.  Psychiatric: He has a normal mood and affect. His behavior is normal. Judgment and thought content normal.  Nursing note and vitals reviewed.     Assessment & Plan:   1. Pharyngitis, unspecified etiology     Pharyngitis Continue to increase fluids. Continue OTC Sudafed and Rx Flonase as directed. Please take Prednisone as directed. If symptoms worsen by end of week, please call clinic and we will send in antibiotic.    FOLLOW-UP:  Return if symptoms worsen or fail to improve. 

## 2018-09-14 ENCOUNTER — Other Ambulatory Visit: Payer: Self-pay | Admitting: Family Medicine

## 2018-09-24 ENCOUNTER — Other Ambulatory Visit: Payer: Self-pay | Admitting: Family Medicine

## 2018-10-22 ENCOUNTER — Other Ambulatory Visit: Payer: Self-pay | Admitting: Family Medicine

## 2018-10-22 DIAGNOSIS — N400 Enlarged prostate without lower urinary tract symptoms: Secondary | ICD-10-CM

## 2018-12-16 ENCOUNTER — Other Ambulatory Visit: Payer: Self-pay | Admitting: Family Medicine

## 2019-01-04 ENCOUNTER — Other Ambulatory Visit: Payer: Self-pay | Admitting: Family Medicine

## 2019-02-27 ENCOUNTER — Ambulatory Visit: Payer: PPO | Admitting: Family Medicine

## 2019-03-13 ENCOUNTER — Other Ambulatory Visit: Payer: Self-pay | Admitting: Family Medicine

## 2019-03-22 DIAGNOSIS — Z85828 Personal history of other malignant neoplasm of skin: Secondary | ICD-10-CM | POA: Diagnosis not present

## 2019-03-22 DIAGNOSIS — Z872 Personal history of diseases of the skin and subcutaneous tissue: Secondary | ICD-10-CM | POA: Diagnosis not present

## 2019-03-22 DIAGNOSIS — D225 Melanocytic nevi of trunk: Secondary | ICD-10-CM | POA: Diagnosis not present

## 2019-03-22 DIAGNOSIS — L814 Other melanin hyperpigmentation: Secondary | ICD-10-CM | POA: Diagnosis not present

## 2019-03-22 DIAGNOSIS — Z08 Encounter for follow-up examination after completed treatment for malignant neoplasm: Secondary | ICD-10-CM | POA: Diagnosis not present

## 2019-03-22 DIAGNOSIS — D044 Carcinoma in situ of skin of scalp and neck: Secondary | ICD-10-CM | POA: Diagnosis not present

## 2019-03-22 DIAGNOSIS — L570 Actinic keratosis: Secondary | ICD-10-CM | POA: Diagnosis not present

## 2019-03-22 DIAGNOSIS — L821 Other seborrheic keratosis: Secondary | ICD-10-CM | POA: Diagnosis not present

## 2019-03-22 DIAGNOSIS — D485 Neoplasm of uncertain behavior of skin: Secondary | ICD-10-CM | POA: Diagnosis not present

## 2019-04-13 ENCOUNTER — Other Ambulatory Visit: Payer: Self-pay | Admitting: Family Medicine

## 2019-04-13 DIAGNOSIS — N400 Enlarged prostate without lower urinary tract symptoms: Secondary | ICD-10-CM

## 2019-04-25 ENCOUNTER — Telehealth: Payer: Self-pay | Admitting: Family Medicine

## 2019-04-25 NOTE — Telephone Encounter (Signed)
Called patient to set up provider required OV/Telehealth, he states his ins [facility] sent a nurse to house for his 6 month f/u --they took vitals & performed a light thorough  exam on him & sent him paperwork which he will bring to Dr.O for review. ---patient states he wishes only for his annual CPE (scheduled for 11/3 & FBW for 10/27)  ---Forwarding message to medical assistant.  --Dion Body

## 2019-04-25 NOTE — Telephone Encounter (Signed)
-----   Message from Jerilee Field, [place] sent at 03/13/2019  8:41 AM EDT ----- Patient is due for follow up, please call the patient to make an appointment.  Thanks. MPulliam, CMA/RT(R)

## 2019-04-30 NOTE — Telephone Encounter (Signed)
Called and left message for patient to call the office. MPulliam, CMA/RT(R)  

## 2019-05-01 DIAGNOSIS — Z20828 Contact with and (suspected) exposure to other viral communicable diseases: Secondary | ICD-10-CM | POA: Diagnosis not present

## 2019-05-07 ENCOUNTER — Other Ambulatory Visit: Payer: Self-pay | Admitting: Family Medicine

## 2019-05-07 DIAGNOSIS — N400 Enlarged prostate without lower urinary tract symptoms: Secondary | ICD-10-CM

## 2019-05-07 NOTE — Telephone Encounter (Signed)
Called patient and will bring paperwork for Dr. Raliegh Scarlet to review. MPulliam, CMA/RT(R)

## 2019-05-08 NOTE — Telephone Encounter (Signed)
Patient brought in paperwork and it was given to Dr. Raliegh Scarlet to review.  Patient notified that medicare does not pay for a traditional CPE but covers the [employer].  Patient informed that he can still have the CPE but it would be billed as an OV and copay if any would apply.  Patient expressed understand and would like to keep appointment for this in November 2020. MPulliam, CMA/RT(R)

## 2019-05-10 ENCOUNTER — Telehealth: Payer: Self-pay

## 2019-05-10 NOTE — Telephone Encounter (Signed)
Patient does not want to come into the office except for his yearly physical, I tried to explain to him that office policy is to follow up at least every 6 months for chronic care and that this is to allow the provider to mange his care in the best possible way.  Patient states that [facility] sends a nurse to do a check every 6 months to his home and he feels that this is all that he needs.  Patient requested that I speak to Dr. Raliegh Scarlet about this and to call him back.  Per Dr. Raliegh Scarlet patient would need at least a 6 month f/u in addition to his well visit due to his health history and his medication that he is taking. I tried to call the patient was unable to reach patient or leave a message. MPulliam, CMA/RT(R)

## 2019-05-13 NOTE — Telephone Encounter (Signed)
Pt informed.  Advised pt to keep lab appt and CPE scheduled for 07/30/2019.  Pt expressed understanding and is agreeable.  Charyl Bigger, CMA

## 2019-05-20 ENCOUNTER — Other Ambulatory Visit: Payer: Self-pay | Admitting: Family Medicine

## 2019-05-20 DIAGNOSIS — N400 Enlarged prostate without lower urinary tract symptoms: Secondary | ICD-10-CM

## 2019-05-22 ENCOUNTER — Encounter: Payer: Self-pay | Admitting: Family Medicine

## 2019-05-22 ENCOUNTER — Ambulatory Visit (INDEPENDENT_AMBULATORY_CARE_PROVIDER_SITE_OTHER): Payer: PPO | Admitting: Family Medicine

## 2019-05-22 ENCOUNTER — Other Ambulatory Visit: Payer: Self-pay

## 2019-05-22 VITALS — Wt 180.0 lb

## 2019-05-22 DIAGNOSIS — N400 Enlarged prostate without lower urinary tract symptoms: Secondary | ICD-10-CM

## 2019-05-22 DIAGNOSIS — I10 Essential (primary) hypertension: Secondary | ICD-10-CM | POA: Insufficient documentation

## 2019-05-22 DIAGNOSIS — Z85828 Personal history of other malignant neoplasm of skin: Secondary | ICD-10-CM | POA: Insufficient documentation

## 2019-05-22 DIAGNOSIS — M48062 Spinal stenosis, lumbar region with neurogenic claudication: Secondary | ICD-10-CM

## 2019-05-22 DIAGNOSIS — R030 Elevated blood-pressure reading, without diagnosis of hypertension: Secondary | ICD-10-CM

## 2019-05-22 DIAGNOSIS — I2510 Atherosclerotic heart disease of native coronary artery without angina pectoris: Secondary | ICD-10-CM | POA: Insufficient documentation

## 2019-05-22 DIAGNOSIS — C801 Malignant (primary) neoplasm, unspecified: Secondary | ICD-10-CM | POA: Insufficient documentation

## 2019-05-22 DIAGNOSIS — D696 Thrombocytopenia, unspecified: Secondary | ICD-10-CM | POA: Insufficient documentation

## 2019-05-22 MED ORDER — DOXAZOSIN MESYLATE 2 MG PO TABS: ORAL_TABLET | ORAL | 0 refills | Status: DC

## 2019-05-22 NOTE — Progress Notes (Signed)
Telehealth office visit note for Larry Harris, D.O- at [facility] at [facility]   I connected with current patient today and verified that I am speaking with the correct person using two identifiers.   . Location of the patient: Home . Location of the provider: Office Only the patient (+/- their family members at pt's discretion) and myself were participating in the encounter    - This visit type was conducted due to national recommendations for restrictions regarding the COVID-19 Pandemic (e.g. social distancing) in an effort to limit this patient's exposure and mitigate transmission in our community.  This format is felt to be most appropriate for this patient at this time.   - The patient did not have access to video technology or had technical difficulties with video requiring transitioning to audio format only. - No physical exam could be performed with this format, beyond that communicated to [place] by the patient/ family members as noted.   - Additionally my office staff/ schedulers discussed with the patient that there may be a monetary charge related to this service, depending on their medical insurance.   The patient expressed understanding, and agreed to proceed.       History of Present Illness:   Notes that since November of 2019, he's been doing good.  Says "as good as anybody can be during this mess that we've been in for all these months."  Per pt, his insurance company called him and asked whether he'd be willing to have an in-home visit through their nurse.  "This was July 6th of 2020."  Says he was in the house, hesitant to go out of the house, and felt it would be a lot safer than coming to the clinic for a six month checkup.  I assume this was his Medicare wellness.  States Dr. Jannifer Franklin "turned me loose."  Extensively discusses his plans to come to the clinic to obtain a physical in near future.   Spinal Stenosis - Nerve Pain Managed on Gabapentin Says he's  "learned to deal with [his neuropathy] a long time ago."  Feels it is "controlled enough that I can deal with it" on his current dose of Neurontin.  Denies side effects    BPH: - Patient continues taking Cardura as prescribed.  No complaints    Elevated Blood Pressure Reading -  His blood pressure unknown at home.  Pt is not checking his blood pressure at home.  States he "does not have anything to check it with."  States his BP was measured around 156/70 in June or July when the nurse came to his house.  - He denies new onset of: chest pain, exercise intolerance, shortness of breath, dizziness, visual changes, headache, lower extremity swelling or claudication.   Last 3 blood pressure readings in our office are as follows: BP Readings from Last 3 Encounters:  09/04/18 129/69  08/27/18 130/80  02/14/18 111/67    Filed Weights   05/22/19 1347  Weight: 180 lb (81.6 kg)     Depression screen [facility] 2/9 05/22/2019 09/04/2018 08/27/2018 02/14/2018 10/23/2017  Decreased Interest 0 0 0 0 1  Down, Depressed, Hopeless 1 0 0 0 0  PHQ - 2 Score 1 0 0 0 1  Altered sleeping - 0 0 0 0  Tired, decreased energy - 1 0 0 1  Change in appetite - 0 0 0 0  Feeling bad or failure about yourself  - 0 0 0 0  Trouble concentrating - 0 0 0 0  Moving slowly or fidgety/restless - 0 0 0 0  Suicidal thoughts - 0 0 0 0  PHQ-9 Score - 1 0 0 2  Difficult doing work/chores - Somewhat difficult Not difficult at all Not difficult at all Somewhat difficult     Impression and Recommendations:    1. Elevated blood pressure reading   2. Spinal stenosis of lumbar region with neurogenic claudication   3. Benign prostatic hyperplasia without lower urinary tract symptoms      - Discussed that last visit with me was for Medicare Wellness on 08/27/2018. - Reviewed need to see patient for follow-up of his chronic conditions at least every six months due to his age and multiple medical conditions.   Elevated Blood  Pressure Reading - Managed on Cardura for benign prostatic hyperplasia.  This is likely helping his blood pressure as well.     Unknown if BP well-controlled at home. - Per pt, states unable to check BP at home.  - Highly recommended investing in a home arm blood pressure cuff. - Discussed OMRON series 3 as an option.  - Ambulatory blood pressure monitoring STRONGLY encouraged. - Recommended checking BP 2-3 times per week. - Encouraged patient to visit the [facility] website for further guidance.  - Will continue to monitor.   Spinal Stenosis of Lumbar Region w/ Neurogenic Claudication - Stable on current management. - continue treatment plan as prescribed.  See med list.  - Per patient, was getting a 90 day supply of gabapentin in past. - States these prescriptions have decreased in supply over time. - Advised patient to keep his appointments every 4-6 months. - Discussed that if patient does not follow up as recommended, rx refills will decrease in quantity until patient comes in for assessment.  Explained that this is to ensure we are properly monitoring our patients. - STRONGLY advised patient to be compliant with follow-up and treatment plan.   Recommendations - Need for lab work in near future to re-check, especially cholesterol and Vitamin D which were both abnormal and last checked. - Last labs drawn around 08/16/2018.  At that time he had elevated LDL.  This will need to be monitored closely and, will add medication if continues to be elevated.  - Emphasized importance of follow-up for both chronic care and Medicare Wellness. - Per patient, states he has yearly exam in Nov and blood work scheduled Oct.  - Advised patient to call his insurance providers and ask if he can obtain another physical. - Patient with significant confusion regarding difference between Medicare Wellness Visits and complete physical examination.  Extensive discussion held with patient  to help elucidate his need to speak with his insurance provider(s) to verify his options and potential coverage.  - Extensive education provided to patient today regarding difference between chronic/acute follow-up and Medicare Wellness.  All questions were answered.   - As part of my medical decision making, I reviewed the following data within the [place] History obtained from pt /family, CMA notes reviewed and incorporated if applicable, Labs reviewed, Radiograph/ tests reviewed if applicable and OV notes from prior OV's with me, as well as other specialists she/he has seen since seeing me last, were all reviewed and used in my medical decision making process today.   - Additionally, discussion had with patient regarding txmnt plan, and their biases/concerns about that plan were used in my medical decision making today.   - The  patient agreed with the plan and demonstrated an understanding of the instructions.   No barriers to understanding were identified.   - Red flag symptoms and signs discussed in detail.  Patient expressed understanding regarding what to do in case of emergency\ urgent symptoms.  The patient was advised to call back or seek an in-person evaluation if the symptoms worsen or if the condition fails to improve as anticipated.   Return ( pt had medicare wellness in June via nurse visit to house thru his med insurance ), for pt will have FBW and then OV 3-5d later to review bldwrk in person and have "PE".    No orders of the defined types were placed in this encounter.   Meds ordered this encounter  Medications  . doxazosin (CARDURA) 2 MG tablet    Sig: TAKE 1 TABLET BY MOUTH AT BEDTIME.    Dispense:  90 tablet    Refill:  0    Medications Discontinued During This Encounter  Medication Reason  . predniSONE (DELTASONE) 20 MG tablet Completed Course  . doxazosin (CARDURA) 2 MG tablet Reorder     I provided 26.5 minutes of non-face-to-face time  during this encounter.   Additional time was spent with charting and coordination of care after the actual visit commenced.   Note:  This note was prepared with assistance of Dragon voice recognition software. Occasional wrong-word or sound-a-like substitutions may have occurred due to the inherent limitations of voice recognition software.  Larry Dance, DO  This document serves as a record of services personally performed by Larry Dance, DO. It was created on her behalf by Toni Amend, a trained medical scribe. The creation of this record is based on the scribe's personal observations and the provider's statements to them.   I have reviewed the above medical documentation for accuracy and completeness and I concur.  Larry Dance, DO 05/22/2019 3:49 PM        Patient Care Team    Relationship Specialty Notifications Start End  Larry Dance, DO PCP - General Family Medicine  02/15/16      -Vitals obtained; medications/ allergies reconciled;  personal medical, social, Sx etc.histories were updated by CMA, reviewed by me and are reflected in chart   Patient Active Problem List   Diagnosis Date Noted  . Hypertension 05/22/2019    Priority: High  . CAD (coronary artery disease)     Priority: Medium  . BPH (benign prostatic hyperplasia) 02/15/2016    Priority: Medium  . Spinal stenosis of lumbar region 06/04/2014    Priority: Medium  . Cancer [facility])     Priority: Low  . Thrombocytopenia, unspecified ([place])     Priority: Low  . Low platelet count- chronically 08/01/2016    Priority: Low  . Pharyngitis 09/04/2018  . Acute maxillary sinusitis 10/23/2017  . Injury of right elbow 05/02/2017  . Overweight (BMI 25.0-29.9) 02/15/2016     Current Meds  Medication Sig  . aspirin 325 MG tablet Take 325 mg by mouth daily.    Marland Kitchen b complex vitamins tablet Take 1 tablet by mouth daily.  . calcium carbonate (OS-CAL) 600 MG TABS Take 600 mg by mouth daily.    .  diphenhydrAMINE (BENADRYL) 25 mg capsule Take 25 mg by mouth as needed (insomnia).  Marland Kitchen doxazosin (CARDURA) 2 MG tablet TAKE 1 TABLET BY MOUTH AT BEDTIME.  . fluticasone (FLONASE) 50 MCG/ACT nasal spray SPRAY 2 SPRAYS INTO EACH NOSTRIL EVERY DAY  . gabapentin (NEURONTIN) 300 MG  capsule TAKE 1 CAPSULE BY MOUTH EVERY MORNING AND 2 CAPSULES BY MOUTH BEFORE BEDTIME  . Multiple Vitamin (MULTIVITAMIN) capsule Take 1 capsule by mouth daily.    . naproxen sodium (ANAPROX) 220 MG tablet Take 220 mg by mouth 2 (two) times daily with a meal. Sciatic nerve pain  . vitamin B-12 (CYANOCOBALAMIN) 1000 MCG tablet Take 1,000 mcg by mouth daily.    Marland Kitchen VITAMIN D, CHOLECALCIFEROL, PO Take 1,000 Units by mouth daily.   . [DISCONTINUED] doxazosin (CARDURA) 2 MG tablet TAKE 1 TABLET BY MOUTH AT BEDTIME. NEEDS OFFICE VISIT FOR FUTHER REFILLS     Allergies:  No Known Allergies   ROS:  See above HPI for pertinent positives and negatives   Objective:   Weight 180 lb (81.6 kg).  (if some vitals are omitted, this means that patient was UNABLE to obtain them even though they were asked to get them prior to OV today.  They were asked to call us at their earliest convenience with these once obtained. )  General: A & O * 3; sounds in no acute distress; in usual state of health.  Skin: Pt confirms warm and dry extremities and pink fingertips HEENT: Pt confirms lips non-cyanotic Chest: Patient confirms normal chest excursion and movement Respiratory: speaking in full sentences, no conversational dyspnea; patient confirms no use of accessory muscles Psych: insight appears good, mood- appears full

## 2019-06-24 ENCOUNTER — Ambulatory Visit (INDEPENDENT_AMBULATORY_CARE_PROVIDER_SITE_OTHER): Payer: PPO | Admitting: Family Medicine

## 2019-06-24 ENCOUNTER — Other Ambulatory Visit: Payer: Self-pay

## 2019-06-24 ENCOUNTER — Encounter: Payer: Self-pay | Admitting: Family Medicine

## 2019-06-24 VITALS — BP 156/71 | HR 83 | Ht 70.25 in | Wt 192.0 lb

## 2019-06-24 DIAGNOSIS — L03317 Cellulitis of buttock: Secondary | ICD-10-CM

## 2019-06-24 DIAGNOSIS — L0231 Cutaneous abscess of buttock: Secondary | ICD-10-CM

## 2019-06-24 DIAGNOSIS — L988 Other specified disorders of the skin and subcutaneous tissue: Secondary | ICD-10-CM

## 2019-06-24 MED ORDER — DOXYCYCLINE HYCLATE 100 MG PO TABS: 100.0000 mg | ORAL_TABLET | Freq: Two times a day (BID) | ORAL | 0 refills | Status: DC

## 2019-06-24 NOTE — Patient Instructions (Signed)

## 2019-06-24 NOTE — Progress Notes (Signed)
Pt here for an acute care OV today  Impression and Recommendations:    1. Cellulitis of buttock   2. Pilonidal disease- possible abcess, but no fluctulence and +surrounding erythema/ cellulitis     perirectal abscess but no fluctuance and + surrounding erythema / cellulitis - Discussed causes of patient sx today. - Education provided and all questions answered. - Handout provided today.  - Reviewed need to treat with antibiotics.   - Antibiotics provided today.  See med list.  - Discussed prudent use of warm compress or sitz baths to alleviate sx in the area. - Reviewed that increased warmth & blood flow to the area will help antibiotics move to the affected tissues and aid the area to form a pustule and "pop" on its own.  - Will continue to monitor closely. - If needed, next time patient returns, the area may be sliced and drained. - Patient knows to keep an eye on the area.  - Discussed that the worst thing to do to the area right now is prodding or pinching it. - Keep hands off of the area. - Try to avoid excess pressure or irritation to the area. - Advised patient to obtain a donut pillow to keep his weight off of the area.  - Expectations for recovery discussed at length with patient today.  Recommendations - Return in near future (two days) for follow-up surveillance of the area. - Patient knows to call in PRN for concerns or OV.   Meds ordered this encounter  Medications  . doxycycline (VIBRA-TABS) 100 MG tablet    Sig: Take 1 tablet (100 mg total) by mouth 2 (two) times daily.    Dispense:  20 tablet    Refill:  0     Education and routine counseling performed. Handouts provided  Gross side effects, risk and benefits, and alternatives of medications and treatment plan in general discussed with patient.  Patient is aware that all medications have potential side effects and we are unable to predict every side effect or drug-drug interaction that may occur.    Patient will call with any questions prior to using medication if they have concerns.    Expresses verbal understanding and consents to current therapy and treatment regimen.  No barriers to understanding were identified.  Red flag symptoms and signs discussed in detail.  Patient expressed understanding regarding what to do in case of emergency\urgent symptoms   Please see AVS handed out to patient at the end of our visit for further patient instructions/ counseling done pertaining to today's office visit.   Return for 2 days- reck of perirectal cellulitis.     Note:  This document was prepared occasionally using Dragon voice recognition software and may include unintentional dictation errors in addition to a scribe.  This document serves as a record of services personally performed by Mellody Dance, DO. It was created on her behalf by Toni Amend, a trained medical scribe. The creation of this record is based on the scribe's personal observations and the provider's statements to them.   I have reviewed the above medical documentation for accuracy and completeness and I concur.  Mellody Dance, DO 06/24/2019 4:34 PM       --------------------------------------------------------------------------------------------------------------------------------------------------------------------------------------------------------------------------------------------    Subjective:    CC:  Chief Complaint  Patient presents with  . Recurrent Skin Infections    HPI: Larry Harris is a 78 y.o. male who presents to [place] at [facility] today for  issues as discussed below.  States "I'm doing alright if I'm standing up."  Notes he does check his BP at home and it's usually good, this morning around 140/70-something.  - Suspected "Skin Boil" Says he thinks he has it "right there on his tailbone."  Thinks that "last Thursday is when we had all that darn  rain."  Says he was watching TV.  Says "on Friday, I thought maybe I just sat so much that it was hurting."  First noticed it last Thursday, and it's "gotten pretty uncomfortable, you gotta be careful sitting down" now.  Says he "had one between my eyes when I was 36 or 78 years old."  Notes his dad has had pilonidal cysts in the past.  Says it hasn't been draining, "not that I'm aware of, I don't think so."  Notes it's more on his right side.     No problems updated.   Wt Readings from Last 3 Encounters:  06/26/19 190 lb (86.2 kg)  06/24/19 192 lb (87.1 kg)  05/22/19 180 lb (81.6 kg)   BP Readings from Last 3 Encounters:  06/26/19 138/71  06/24/19 (!) 156/71  09/04/18 129/69   BMI Readings from Last 3 Encounters:  06/26/19 27.07 kg/m  06/24/19 27.35 kg/m  05/22/19 25.64 kg/m     Patient Care Team    Relationship Specialty Notifications Start End  Mellody Dance, DO PCP - General Family Medicine  02/15/16      Patient Active Problem List   Diagnosis Date Noted  . Hypertension 05/22/2019    Priority: High  . CAD (coronary artery disease)     Priority: Medium  . BPH (benign prostatic hyperplasia) 02/15/2016    Priority: Medium  . Spinal stenosis of lumbar region 06/04/2014    Priority: Medium  . Cancer [facility])     Priority: Low  . Thrombocytopenia, unspecified ([place])     Priority: Low  . Low platelet count- chronically 08/01/2016    Priority: Low  . Pharyngitis 09/04/2018  . Acute maxillary sinusitis 10/23/2017  . Injury of right elbow 05/02/2017  . Overweight (BMI 25.0-29.9) 02/15/2016      Past Medical History:  Diagnosis Date  . Acute rhinitis   . Allergic rhinitis, cause unspecified   . Allergy   . BPH (benign prostatic hyperplasia)   . CAD (coronary artery disease)   . Cancer ([place])    scalp basal cell skin ca  . Cataract    bilateral cateracts removed  . Clotting disorder ([place])   . Hypercholesteremia   . Hypotension   . Neuralgia,  neuritis, and radiculitis, unspecified   . Osteoarthritis   . Spinal stenosis of lumbar region 06/04/2014  . Thrombocytopenia, unspecified ([place])      Past Surgical History:  Procedure Laterality Date  . APPENDECTOMY  1958  . bilateral cateracts removed    . COLONOSCOPY    . HERNIA REPAIR  1965   x2     Family History  Problem Relation Age of Onset  . Stroke Mother 34  . Prostate cancer Father 70  . Heart failure Brother 8  . Lung cancer Sister   . Colon cancer Neg Hx   . Esophageal cancer Neg Hx   . Rectal cancer Neg Hx   . Stomach cancer Neg Hx      Social History   Socioeconomic History  . Marital status: Married    Spouse name: Not on file  . Number of children: 2  . Years of education:  college 4  . Highest education level: Not on file  Occupational History  . Occupation: Retired  Scientific laboratory technician  . Financial resource strain: Not on file  . Food insecurity    Worry: Not on file    Inability: Not on file  . Transportation needs    Medical: Not on file    Non-medical: Not on file  Tobacco Use  . Smoking status: Former Smoker    Packs/day: 1.00    Years: 25.00    Pack years: 25.00    Types: Cigarettes    Quit date: 07/27/1994    Years since quitting: 24.9  . Smokeless tobacco: Never Used  Substance and Sexual Activity  . Alcohol use: Yes    Alcohol/week: 1.0 standard drinks    Types: 1 Glasses of wine per week    Comment: occasional  . Drug use: No  . Sexual activity: Not on file  Lifestyle  . Physical activity    Days per week: Not on file    Minutes per session: Not on file  . Stress: Not on file  Relationships  . Social Herbalist on phone: Not on file    Gets together: Not on file    Attends religious service: Not on file    Active member of club or organization: Not on file    Attends meetings of clubs or organizations: Not on file    Relationship status: Not on file  . Intimate partner violence    Fear of current or ex partner: Not  on file    Emotionally abused: Not on file    Physically abused: Not on file    Forced sexual activity: Not on file  Other Topics Concern  . Not on file  Social History Narrative   The patient is widowed. 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 He has no history of alcohol use. He has previously done regular exercise until developing a back problem.      Patient drinks about 3-4 cups of coffee daily.   Patient is right handed.      Current Meds  Medication Sig  . aspirin 325 MG tablet Take 325 mg by mouth daily.    Marland Kitchen b complex vitamins tablet Take 1 tablet by mouth daily.  . calcium carbonate (OS-CAL) 600 MG TABS Take 600 mg by mouth daily.    . diphenhydrAMINE (BENADRYL) 25 mg capsule Take 25 mg by mouth as needed (insomnia).  Marland Kitchen doxazosin (CARDURA) 2 MG tablet TAKE 1 TABLET BY MOUTH AT BEDTIME.  . fluticasone (FLONASE) 50 MCG/ACT nasal spray SPRAY 2 SPRAYS INTO EACH NOSTRIL EVERY DAY  . gabapentin (NEURONTIN) 300 MG capsule TAKE 1 CAPSULE BY MOUTH EVERY MORNING AND 2 CAPSULES BY MOUTH BEFORE BEDTIME  . hydrocortisone (ANUSOL-HC) 25 MG suppository Place 1 suppository (25 mg total) rectally 2 (two) times daily.  . Multiple Vitamin (MULTIVITAMIN) capsule Take 1 capsule by mouth daily.    . naproxen sodium (ANAPROX) 220 MG tablet Take 220 mg by mouth 2 (two) times daily with a meal. Sciatic nerve pain  . vitamin B-12 (CYANOCOBALAMIN) 1000 MCG tablet Take 1,000 mcg by mouth daily.    Marland Kitchen VITAMIN D, CHOLECALCIFEROL, PO Take 1,000 Units by mouth daily.     Allergies:  No Known Allergies   Review of Systems: General:   Denies fever, chills, unexplained weight loss.  Optho/Auditory:   Denies visual changes, blurred vision/LOV Respiratory:   Denies wheeze, DOE more  than baseline levels.   Cardiovascular:   Denies chest pain, palpitations, new onset peripheral edema  Gastrointestinal:   Denies nausea, vomiting, diarrhea, abd pain.  Genitourinary: Denies dysuria, freq/ urgency,  flank pain or discharge from genitals.  Endocrine:     Denies hot or cold intolerance, polyuria, polydipsia. Musculoskeletal:   Denies unexplained myalgias, joint swelling, unexplained arthralgias, gait problems.  Skin:  Denies new onset rash, suspicious lesions Neurological:     Denies dizziness, unexplained weakness, numbness  Psychiatric/Behavioral:   Denies mood changes, suicidal or homicidal ideations, hallucinations    Objective:   Blood pressure (!) 156/71, pulse 83, height 5' 10.25" (1.784 m), weight 192 lb (87.1 kg), SpO2 99 %. Body mass index is 27.35 kg/m. General:  Well Developed, well nourished, appropriate for stated age.  Neuro:  Alert and oriented,  extra-ocular muscles intact  HEENT:  Normocephalic, atraumatic, neck supple Skin: Near the top portion of the R lateral gluteal fold, erythematous tissue and redness with tenderness and induration of the skin.  no fluctuance, no pus.  Superficial tenderness to palpation.   Respiratory: Not using accessory muscles, speaking in full sentences- unlabored. Vascular:  Ext warm, no cyanosis apprec.; cap RF less 2 sec. Psych:  No HI/SI, judgement and insight good, Euthymic mood. Full Affect. Rectal:  normal, no tenderness upon rectal palpation.  No bogginess or tenderness superior to the anal sphincter.  Digital rectal exam did not show concerns for deep space infection today.

## 2019-06-26 ENCOUNTER — Other Ambulatory Visit: Payer: Self-pay

## 2019-06-26 ENCOUNTER — Ambulatory Visit (INDEPENDENT_AMBULATORY_CARE_PROVIDER_SITE_OTHER): Payer: PPO | Admitting: Family Medicine

## 2019-06-26 ENCOUNTER — Encounter: Payer: Self-pay | Admitting: Family Medicine

## 2019-06-26 VITALS — BP 138/71 | HR 80 | Temp 97.9°F | Ht 70.25 in | Wt 190.0 lb

## 2019-06-26 DIAGNOSIS — L03317 Cellulitis of buttock: Secondary | ICD-10-CM

## 2019-06-26 DIAGNOSIS — K610 Anal abscess: Secondary | ICD-10-CM

## 2019-06-26 DIAGNOSIS — L0231 Cutaneous abscess of buttock: Secondary | ICD-10-CM | POA: Diagnosis not present

## 2019-06-26 NOTE — Progress Notes (Signed)
Pt here for an acute care OV today  Impression and Recommendations:    1. Abscess, perianal   2. Cellulitis of buttock      Abscess, perianal  Cellulitis of buttock - Plan: Ambulatory referral to General Surgery  Perianal Abscess - Advised patient of possible causes of symptoms today.  - Discussed that the area doesn't appear to look much worse than prior, but that we would feel better if patient followed up with GI for lancing, draining, and packing the area.  - Extensively discussed that the area in which the concern is located is high risk for complications.  - Ambulatory referral to GI / General Surgery placed today.  - Continue antibiotics as prescribed.   Orders Placed This Encounter  Procedures   Ambulatory referral to General Surgery     Education and routine counseling performed. Handouts provided  Gross side effects, risk and benefits, and alternatives of medications and treatment plan in general discussed with patient.  Patient is aware that all medications have potential side effects and we are unable to predict every side effect or drug-drug interaction that may occur.   Patient will call with any questions prior to using medication if they have concerns.    Expresses verbal understanding and consents to current therapy and treatment regimen.  No barriers to understanding were identified.  Red flag symptoms and signs discussed in detail.  Patient expressed understanding regarding what to do in case of emergency\urgent symptoms   Please see AVS handed out to patient at the end of our visit for further patient instructions/ counseling done pertaining to today's office visit.   Return for For chronic care near future needs full set [facility] with same-day Medicare wellness November.     Note:  This document was prepared occasionally using Dragon voice recognition software and may include unintentional dictation errors in addition to a scribe.  This  document serves as a record of services personally performed by Mellody Dance, DO. It was created on her behalf by Toni Amend, a trained medical scribe. The creation of this record is based on the scribe's personal observations and the provider's statements to them.   I have reviewed the above medical documentation for accuracy and completeness and I concur.  Mellody Dance, DO 06/27/2019 11:55 AM     --------------------------------------------------------------------------------------------------------------------------------------------------------------------------------------------------------------------------------------------    Subjective:    CC:  Chief Complaint  Patient presents with   Recurrent Skin Infections    HPI: Larry Harris is a 78 y.o. male who presents to [place] at [facility] today for issues as discussed below.  Denies fever or chills today.  Denies pain with bowel movements.  Says it's "worse sitting."  Notes "just can't get comfortable."  Says "that donut didn't do much for me."  States "it hits" in such a way that he can't sit.  Taking antibiotics and tolerating well; denies GI concerns.  Denies concerns with stool consistency or bowel movements.  No problems updated.   Wt Readings from Last 3 Encounters:  06/26/19 190 lb (86.2 kg)  06/24/19 192 lb (87.1 kg)  05/22/19 180 lb (81.6 kg)   BP Readings from Last 3 Encounters:  06/26/19 138/71  06/24/19 (!) 156/71  09/04/18 129/69   BMI Readings from Last 3 Encounters:  06/26/19 27.07 kg/m  06/24/19 27.35 kg/m  05/22/19 25.64 kg/m     Patient Care Team    Relationship Specialty Notifications Start End  Mellody Dance, DO PCP -  General Family Medicine  02/15/16      Patient Active Problem List   Diagnosis Date Noted   Hypertension 05/22/2019    Priority: High   CAD (coronary artery disease)     Priority: Medium   BPH (benign prostatic  hyperplasia) 02/15/2016    Priority: Medium   Spinal stenosis of lumbar region 06/04/2014    Priority: Medium   Cancer [facility])     Priority: Low   Thrombocytopenia, unspecified ([place])     Priority: Low   Low platelet count- chronically 08/01/2016    Priority: Low   Pharyngitis 09/04/2018   Acute maxillary sinusitis 10/23/2017   Injury of right elbow 05/02/2017   Overweight (BMI 25.0-29.9) 02/15/2016      Past Medical History:  Diagnosis Date   Acute rhinitis    Allergic rhinitis, cause unspecified    Allergy    BPH (benign prostatic hyperplasia)    CAD (coronary artery disease)    Cancer ([facility])    scalp basal cell skin ca   Cataract    bilateral cateracts removed   Clotting disorder ([facility])    Hypercholesteremia    Hypotension    Neuralgia, neuritis, and radiculitis, unspecified    Osteoarthritis    Spinal stenosis of lumbar region 06/04/2014   Thrombocytopenia, unspecified ([employer])      Past Surgical History:  Procedure Laterality Date   APPENDECTOMY  1958   bilateral cateracts removed     [place]   x2     Family History  Problem Relation Age of Onset   Stroke Mother 74   Prostate cancer Father 63   Heart failure Brother 97   Lung cancer Sister    Colon cancer Neg Hx    Esophageal cancer Neg Hx    Rectal cancer Neg Hx    Stomach cancer Neg Hx      Social History   Socioeconomic History   Marital status: Married    Spouse name: Not on file   Number of children: 2   Years of education: college 4   Highest education level: Not on file  Occupational History   Occupation: Retired  Scientist, product/process development strain: Not on file   Food insecurity    Worry: Not on file    Inability: Not on Lexicographer needs    Medical: Not on file    Non-medical: Not on file  Tobacco Use   Smoking status: Former Smoker    Packs/day: 1.00    Years: 25.00    Pack years: 25.00    Types:  Cigarettes    Quit date: 07/27/1994    Years since quitting: 24.9   Smokeless tobacco: Never Used  Substance and Sexual Activity   Alcohol use: Yes    Alcohol/week: 1.0 standard drinks    Types: 1 Glasses of wine per week    Comment: occasional   Drug use: No   Sexual activity: Not on file  Lifestyle   Physical activity    Days per week: Not on file    Minutes per session: Not on file   Stress: Not on file  Relationships   Social connections    Talks on phone: Not on file    Gets together: Not on file    Attends religious service: Not on file    Active member of club or organization: Not on file    Attends meetings of clubs or organizations: Not  on file    Relationship status: Not on file   Intimate partner violence    Fear of current or ex partner: Not on file    Emotionally abused: Not on file    Physically abused: Not on file    Forced sexual activity: Not on file  Other Topics Concern   Not on file  Social History Narrative   The patient is widowed. 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 He has no history of alcohol use. He has previously done regular exercise until developing a back problem.      Patient drinks about 3-4 cups of coffee daily.   Patient is right handed.      Current Meds  Medication Sig   aspirin 325 MG tablet Take 325 mg by mouth daily.     b complex vitamins tablet Take 1 tablet by mouth daily.   calcium carbonate (OS-CAL) 600 MG TABS Take 600 mg by mouth daily.     diphenhydrAMINE (BENADRYL) 25 mg capsule Take 25 mg by mouth as needed (insomnia).   doxazosin (CARDURA) 2 MG tablet TAKE 1 TABLET BY MOUTH AT BEDTIME.   fluticasone (FLONASE) 50 MCG/ACT nasal spray SPRAY 2 SPRAYS INTO EACH NOSTRIL EVERY DAY   gabapentin (NEURONTIN) 300 MG capsule TAKE 1 CAPSULE BY MOUTH EVERY MORNING AND 2 CAPSULES BY MOUTH BEFORE BEDTIME   hydrocortisone (ANUSOL-HC) 25 MG suppository Place 1 suppository (25 mg total) rectally 2 (two) times  daily.   Multiple Vitamin (MULTIVITAMIN) capsule Take 1 capsule by mouth daily.     naproxen sodium (ANAPROX) 220 MG tablet Take 220 mg by mouth 2 (two) times daily with a meal. Sciatic nerve pain   vitamin B-12 (CYANOCOBALAMIN) 1000 MCG tablet Take 1,000 mcg by mouth daily.     VITAMIN D, CHOLECALCIFEROL, PO Take 1,000 Units by mouth daily.    [DISCONTINUED] doxycycline (VIBRA-TABS) 100 MG tablet Take 1 tablet (100 mg total) by mouth 2 (two) times daily.    Allergies:  No Known Allergies   Review of Systems: General:   Denies fever, chills, unexplained weight loss.  Optho/Auditory:   Denies visual changes, blurred vision/LOV Respiratory:   Denies wheeze, DOE more than baseline levels.   Cardiovascular:   Denies chest pain, palpitations, new onset peripheral edema  Gastrointestinal:   Denies nausea, vomiting, diarrhea, abd pain.  Genitourinary: Denies dysuria, freq/ urgency, flank pain or discharge from genitals.  Endocrine:     Denies hot or cold intolerance, polyuria, polydipsia. Musculoskeletal:   Denies unexplained myalgias, joint swelling, unexplained arthralgias, gait problems.  Skin:  Denies new onset rash, suspicious lesions Neurological:     Denies dizziness, unexplained weakness, numbness  Psychiatric/Behavioral:   Denies mood changes, suicidal or homicidal ideations, hallucinations    Objective:   Blood pressure 138/71, pulse 80, temperature 97.9 F (36.6 C), temperature source Oral, height 5' 10.25" (1.784 m), weight 190 lb (86.2 kg), SpO2 99 %. Body mass index is 27.07 kg/m. General:  Well Developed, well nourished, appropriate for stated age.  Neuro:  Alert and oriented,  extra-ocular muscles intact  HEENT:  Normocephalic, atraumatic, neck supple Skin:Near the top of the upper gluteal fold, erythematous tissue and redness with tenderness and induration of the skin of the right side, no fluctuance yet, no pus- all hardeden.  Superficial tenderness to palpation.   No gross rash, warm, pink. no gross rash, warm, pink. Cardiac:  RRR, S1 S2 Respiratory:  ECTA B/L and A/P, Not using accessory muscles,  speaking in full sentences- unlabored. Vascular:  Ext warm, no cyanosis apprec.; cap RF less 2 sec. Psych:  No HI/SI, judgement and insight good, Euthymic mood. Full Affect.

## 2019-06-28 ENCOUNTER — Telehealth: Payer: Self-pay

## 2019-06-28 ENCOUNTER — Ambulatory Visit (INDEPENDENT_AMBULATORY_CARE_PROVIDER_SITE_OTHER): Payer: PPO | Admitting: Surgery

## 2019-06-28 ENCOUNTER — Encounter: Payer: Self-pay | Admitting: Surgery

## 2019-06-28 ENCOUNTER — Other Ambulatory Visit: Payer: Self-pay

## 2019-06-28 ENCOUNTER — Other Ambulatory Visit: Payer: Self-pay | Admitting: Surgery

## 2019-06-28 DIAGNOSIS — L720 Epidermal cyst: Secondary | ICD-10-CM | POA: Diagnosis not present

## 2019-06-28 DIAGNOSIS — L723 Sebaceous cyst: Secondary | ICD-10-CM

## 2019-06-28 DIAGNOSIS — L0591 Pilonidal cyst without abscess: Secondary | ICD-10-CM

## 2019-06-28 MED ORDER — AMOXICILLIN-POT CLAVULANATE 875-125 MG PO TABS: 1.0000 | ORAL_TABLET | Freq: Two times a day (BID) | ORAL | 0 refills | Status: AC

## 2019-06-28 NOTE — Progress Notes (Signed)
06/28/2019  Reason for Visit:  Infected cyst of buttocks  Referring Provider:  Mellody Dance, MD  History of Present Illness: Larry Harris is a 78 y.o. male presenting for evaluation of an infected cyst of the buttocks.  Patient reports that about a week ago during the rain, he spent the day sitting in his house and he feels he injured his tailbone.  The next day, he started having some discomfort in the buttocks area and by Monday 9/28 he went to see his PCP.  He was started on Doxycycline, but continued to worsen.  He saw his PCP again on 9/30 and was referred here for further evaluation.  He reports that with the antibiotic, now he feels it's getting better with less pain compared to before, but he is having some purulent drainage and redness in the area.  Past Medical History: Past Medical History:  Diagnosis Date  . Acute rhinitis   . Allergic rhinitis, cause unspecified   . Allergy   . BPH (benign prostatic hyperplasia)   . CAD (coronary artery disease)   . Cancer ([place])    scalp basal cell skin ca  . Cataract    bilateral cateracts removed  . Clotting disorder ([place])   . Hypercholesteremia   . Hypotension   . Neuralgia, neuritis, and radiculitis, unspecified   . Osteoarthritis   . Spinal stenosis of lumbar region 06/04/2014  . Thrombocytopenia, unspecified ([place])      Past Surgical History: Past Surgical History:  Procedure Laterality Date  . APPENDECTOMY  1958  . bilateral cateracts removed    . COLONOSCOPY    . [place]   x2    Home Medications: Prior to Admission medications   Medication Sig Start Date End Date Taking? Authorizing Provider  aspirin 325 MG tablet Take 325 mg by mouth daily.     Yes [provider]  b complex vitamins tablet Take 1 tablet by mouth daily.   Yes [provider]  calcium carbonate (OS-CAL) 600 MG TABS Take 600 mg by mouth daily.     Yes [provider]  diphenhydrAMINE (BENADRYL) 25 mg  capsule Take 25 mg by mouth as needed (insomnia).   Yes [provider]  doxazosin (CARDURA) 2 MG tablet TAKE 1 TABLET BY MOUTH AT BEDTIME. 05/22/19  Yes Opalski, Deborah, DO  fluticasone (FLONASE) 50 MCG/ACT nasal spray SPRAY 2 SPRAYS INTO EACH NOSTRIL EVERY DAY 01/07/19  Yes Opalski, Deborah, DO  gabapentin (NEURONTIN) 300 MG capsule TAKE 1 CAPSULE BY MOUTH EVERY MORNING AND 2 CAPSULES BY MOUTH BEFORE BEDTIME 03/13/19  Yes Opalski, Neoma Laming, DO  hydrocortisone (ANUSOL-HC) 25 MG suppository Place 1 suppository (25 mg total) rectally 2 (two) times daily. 08/24/17  Yes Opalski, Deborah, DO  Multiple Vitamin (MULTIVITAMIN) capsule Take 1 capsule by mouth daily.     Yes [provider]  naproxen sodium (ANAPROX) 220 MG tablet Take 220 mg by mouth 2 (two) times daily with a meal. Sciatic nerve pain   Yes [provider]  vitamin B-12 (CYANOCOBALAMIN) 1000 MCG tablet Take 1,000 mcg by mouth daily.     Yes [provider]  VITAMIN D, CHOLECALCIFEROL, PO Take 1,000 Units by mouth daily.    Yes [provider]  amoxicillin-clavulanate (AUGMENTIN) 875-125 MG tablet Take 1 tablet by mouth 2 (two) times daily for 7 days. 06/28/19 07/05/19  Olean Ree, MD    Allergies: No Known Allergies  Social History:  reports that he quit smoking about  24 years ago. His smoking use included cigarettes. He has a 25.00 pack-year smoking history. He has never used smokeless tobacco. He reports current alcohol use of about 1.0 standard drinks of alcohol per week. He reports that he does not use drugs.   Family History: Family History  Problem Relation Age of Onset  . Stroke Mother 48  . Prostate cancer Father 103  . Heart failure Brother 47  . Lung cancer Sister   . Colon cancer Neg Hx   . Esophageal cancer Neg Hx   . Rectal cancer Neg Hx   . Stomach cancer Neg Hx     Review of Systems: Review of Systems  Constitutional: Negative for chills and fever.  HENT: Negative for  hearing loss.   Eyes: Negative for blurred vision.  Respiratory: Negative for shortness of breath.   Cardiovascular: Negative for chest pain.  Gastrointestinal: Negative for abdominal pain, nausea and vomiting.  Genitourinary: Negative for dysuria.  Musculoskeletal: Negative for myalgias.  Skin:       Drainage and redness with tenderness in upper right buttocks area  Neurological: Negative for dizziness.  Psychiatric/Behavioral: Negative for depression.    Physical Exam There were no vitals taken for this visit. CONSTITUTIONAL: No acute distress HEENT:  Normocephalic, atraumatic, extraocular motion intact. NECK: Trachea is midline, and there is no jugular venous distension.  RESPIRATORY:  Lungs are clear, and breath sounds are equal bilaterally. Normal respiratory effort without pathologic use of accessory muscles. CARDIOVASCULAR: Heart is regular without murmurs, gallops, or rubs. GI: The abdomen is soft, non-distended, non-tender.  MUSCULOSKELETAL:  Normal muscle strength and tone in all four extremities.  No peripheral edema or cyanosis. SKIN:  The patient has a 2 x 1 cm area in the upper medial right buttocks, just to the right of the cleft, that is indurated, erythematous, and with some purulent drainage.  I do not see any pits on the skin to suggest pilonidal disease. NEUROLOGIC:  Motor and sensation is grossly normal.  Cranial nerves are grossly intact. PSYCH:  Alert and oriented to person, place and time. Affect is normal.  Laboratory Analysis: No results found for this or any previous visit (from the past 24 hour(s)).  Imaging: No results found.  Assessment and Plan: This is a 78 y.o. male with an infected cyst of the upper medial buttocks  Discussed with the patient that likely this is an infected cyst and though the antibiotic has helped a bit, I would still recommend doing I&D to get the purulent fluid drained.  Discussed with him minimal risks of bleeding, infection,  and injury to surrounding structures and he's willing to proceed.   Procedure Date:  06/28/2019  Pre-operative Diagnosis:  Infected cyst of right upper medial buttocks  Post-operative Diagnosis:  Infected sebaceous cyst of right upper medial buttocks  Procedure:  Incision and Drainage of infected cyst and excision of cyst  Surgeon:  Melvyn Neth, MD  Assistant:  Deno Etienne, PA-S  Anesthesia:  4 ml 1% lidocaine with epi  Estimated Blood Loss:  2 ml  Specimens:  Culture swab and cyst capsule  Complications:  None  Indications for Procedure:  This is a 78 y.o. male with diagnosis of infected cyst of right upper medial buttocks, requiring drainage procedure.  The risks of bleeding, abscess or infection, injury to surrounding structures, and need for further procedures were all discussed with the patient and was willing to proceed.  Description of Procedure: The patient was correctly identified at bedside.  Appropriate time-outs were performed prior to procedure.  The patient's sacral area was prepped and draped in usual sterile fashion.  Local anesthetic was infused intradermally.  A 2 cm incision was made over the abscess, revealing purulent fluid as well as keratinous fluid.  This fluid was swabbed for culture and sent to micro.  Small Kelly forceps were used to dissect around the abscess tissue to open any remaining pockets of purulent fluid.  In doing this, almost the entire cyst capsule was excised, with another very small component resected after.  After drainage and excision were completed, the cavity was irrigated and cleaned.  The wound was packed with the tip of a  4x4 gauze and covered with dry gauze and tape.  The patient tolerated the procedure well and all sharps were appropriately disposed of at the end of the case.   --Instructed patient that tomorrow he can remove the packing and can then just do superficial dry gauze dressing changes once daily and as needed  to keep the wound clean and dry. --Will give patient new prescription for Augmentin as a precaution instead of Doxycycline.   --Patient may shower tomorrow. --Follow up 1 week for wound check  Face-to-face time spent with the patient and care providers was 60 minutes, with more than 50% of the time spent counseling, educating, and coordinating care of the patient.     Melvyn Neth, [facility]

## 2019-06-28 NOTE — Telephone Encounter (Signed)
Placed call to [facility] for specimen pick up due to office closing early. Specimen placed in white box outside our office door.

## 2019-06-28 NOTE — Patient Instructions (Addendum)
Tomorrow you may remove your gauze, then shower letting the water run over the area well. Then replace with dry gauze over the area you may tape this if able. No need to pack the wound.   Stop your Doxycycline. Start your new antibiotics.   Follow up here in 1 week with our PA Otho Ket.

## 2019-07-01 ENCOUNTER — Other Ambulatory Visit: Payer: Self-pay | Admitting: Family Medicine

## 2019-07-02 ENCOUNTER — Ambulatory Visit: Payer: PPO | Admitting: Surgery

## 2019-07-02 LAB — ANAEROBIC AND AEROBIC CULTURE

## 2019-07-04 ENCOUNTER — Ambulatory Visit (INDEPENDENT_AMBULATORY_CARE_PROVIDER_SITE_OTHER): Payer: PPO | Admitting: Physician Assistant

## 2019-07-04 ENCOUNTER — Encounter: Payer: Self-pay | Admitting: Physician Assistant

## 2019-07-04 ENCOUNTER — Other Ambulatory Visit: Payer: Self-pay

## 2019-07-04 VITALS — BP 120/82 | HR 86 | Temp 97.3°F | Ht 70.0 in | Wt 189.4 lb

## 2019-07-04 DIAGNOSIS — L723 Sebaceous cyst: Secondary | ICD-10-CM

## 2019-07-04 DIAGNOSIS — Z09 Encounter for follow-up examination after completed treatment for conditions other than malignant neoplasm: Secondary | ICD-10-CM

## 2019-07-04 NOTE — Patient Instructions (Signed)
Follow-up with our office as needed.  Please call and ask to speak with a nurse if you develop questions or concerns.   GENERAL POST-OPERATIVE PATIENT INSTRUCTIONS   WOUND CARE INSTRUCTIONS:  Keep a dry clean dressing on the wound if there is drainage. The initial bandage may be removed after 24 hours.  Once the wound has quit draining you may leave it open to air.  If clothing rubs against the wound or causes irritation and the wound is not draining you may cover it with a dry dressing during the daytime.  Try to keep the wound dry and avoid ointments on the wound unless directed to do so.  If the wound becomes bright red and painful or starts to drain infected material that is not clear, please contact your physician immediately.  If the wound is mildly pink and has a thick firm ridge underneath it, this is normal, and is referred to as a healing ridge.  This will resolve over the next 4-6 weeks.  BATHING: You may shower if you have been informed of this by your surgeon. However, Please do not submerge in a tub, hot tub, or pool until incisions are completely sealed or have been told by your surgeon that you may do so.  DIET:  You may eat any foods that you can tolerate.  It is a good idea to eat a high fiber diet and take in plenty of fluids to prevent constipation.  If you do become constipated you may want to take a mild laxative or take ducolax tablets on a daily basis until your bowel habits are regular.  Constipation can be very uncomfortable, along with straining, after recent surgery.  ACTIVITY:  You are encouraged to cough and deep breath or use your incentive spirometer if you were given one, every 15-30 minutes when awake.  This will help prevent respiratory complications and low grade fevers post-operatively if you had a general anesthetic.  You may want to hug a pillow when coughing and sneezing to add additional support to the surgical area, if you had abdominal or chest surgery, which  will decrease pain during these times.  You are encouraged to walk and engage in light activity for the next two weeks.  Twenty pounds is roughly equivalent to a plastic bag of groceries. At that time- Listen to your body when lifting, if you have pain when lifting, stop and then try again in a few days. Soreness after doing exercises or activities of daily living is normal as you get back in to your normal routine.  MEDICATIONS:  Try to take narcotic medications and anti-inflammatory medications, such as tylenol, ibuprofen, naprosyn, etc., with food.  This will minimize stomach upset from the medication.  Should you develop nausea and vomiting from the pain medication, or develop a rash, please discontinue the medication and contact your physician.  You should not drive, make important decisions, or operate machinery when taking narcotic pain medication.  SUNBLOCK Use sun block to incision area over the next year if this area will be exposed to sun. This helps decrease scarring and will allow you avoid a permanent darkened area over your incision.  QUESTIONS:  Please feel free to call our office if you have any questions, and we will be glad to assist you. [phone]

## 2019-07-04 NOTE — Progress Notes (Signed)
[facility] POST-OP OFFICE VISIT  07/04/2019  HPI: Larry Harris is a 78 y.o. male 6 days s/p incision and drainage of infected right upper buttock sebaceous cyst with Dr Hampton Abbot.   Today, he reports that he is doing well. He notes occasionally he "can feel that we worked back there" especially in certain positions however pain is not an issue. No fever or chills. Minimal intermittent drainage which is not even enough to soak through underwear. He has 1 more day left of Augmentin. No other complaints.    Vital signs: BP 120/82   Pulse 86   Temp (!) 97.3 F (36.3 C) (Temporal)   Ht 5\' 10"  (1.778 m)   Wt 189 lb 6.4 oz (85.9 kg)   SpO2 96%   BMI 27.18 kg/m    Physical Exam: Constitutional: Well appearing male, NAD Skin: small 0.5 x 0.5 cm opening to the medial superior right gluteal crease, there is surrounding induration, no erythema, no drainage, no palpable fluctuance or crepitus.   Assessment/Plan: This is a 78 y.o. male infected right upper buttock sebaceous cyst   - pain control prn  - dry dressings prn  - complete ABx  - reviewed pathology; epidermal cyst  - return precautions given and discussed  - rtc prn  -- Edison Simon, PA-C [facility] 07/04/2019, 10:27 AM [phone] M-F: 7am - 4pm

## 2019-07-23 ENCOUNTER — Other Ambulatory Visit: Payer: PPO

## 2019-07-30 ENCOUNTER — Encounter: Payer: PPO | Admitting: Family Medicine

## 2019-08-26 ENCOUNTER — Other Ambulatory Visit: Payer: Self-pay

## 2019-08-26 ENCOUNTER — Other Ambulatory Visit: Payer: PPO

## 2019-08-26 DIAGNOSIS — D696 Thrombocytopenia, unspecified: Secondary | ICD-10-CM | POA: Diagnosis not present

## 2019-08-26 DIAGNOSIS — I10 Essential (primary) hypertension: Secondary | ICD-10-CM

## 2019-08-26 DIAGNOSIS — R7989 Other specified abnormal findings of blood chemistry: Secondary | ICD-10-CM

## 2019-08-26 DIAGNOSIS — Z0000 Encounter for general adult medical examination without abnormal findings: Secondary | ICD-10-CM | POA: Diagnosis not present

## 2019-08-27 LAB — CBC WITH DIFFERENTIAL/PLATELET
Basophils Absolute: 0.1 10*3/uL (ref 0.0–0.2)
Basos: 1 %
EOS (ABSOLUTE): 0.2 10*3/uL (ref 0.0–0.4)
Eos: 3 %
Hematocrit: 43.2 % (ref 37.5–51.0)
Hemoglobin: 14.4 g/dL (ref 13.0–17.7)
Immature Grans (Abs): 0 10*3/uL (ref 0.0–0.1)
Immature Granulocytes: 0 %
Lymphocytes Absolute: 2 10*3/uL (ref 0.7–3.1)
Lymphs: 28 %
MCH: 31.6 pg (ref 26.6–33.0)
MCHC: 33.3 g/dL (ref 31.5–35.7)
MCV: 95 fL (ref 79–97)
Monocytes Absolute: 0.7 10*3/uL (ref 0.1–0.9)
Monocytes: 9 %
Neutrophils Absolute: 4.3 10*3/uL (ref 1.4–7.0)
Neutrophils: 59 %
Platelets: 141 10*3/uL — ABNORMAL LOW (ref 150–450)
RBC: 4.56 x10E6/uL (ref 4.14–5.80)
RDW: 13.1 % (ref 11.6–15.4)
WBC: 7.3 10*3/uL (ref 3.4–10.8)

## 2019-08-27 LAB — COMPREHENSIVE METABOLIC PANEL
ALT: 15 IU/L (ref 0–44)
AST: 14 IU/L (ref 0–40)
Albumin/Globulin Ratio: 1.8 (ref 1.2–2.2)
Albumin: 3.3 g/dL — ABNORMAL LOW (ref 3.7–4.7)
Alkaline Phosphatase: 66 IU/L (ref 39–117)
BUN/Creatinine Ratio: 17 (ref 10–24)
BUN: 15 mg/dL (ref 8–27)
Bilirubin Total: 0.5 mg/dL (ref 0.0–1.2)
CO2: 26 mmol/L (ref 20–29)
Calcium: 8.7 mg/dL (ref 8.6–10.2)
Chloride: 105 mmol/L (ref 96–106)
Creatinine, Ser: 0.88 mg/dL (ref 0.76–1.27)
GFR calc Af Amer: 94 mL/min/{1.73_m2} (ref >59)
GFR calc non Af Amer: 81 mL/min/{1.73_m2} (ref >59)
Globulin, Total: 1.8 g/dL (ref 1.5–4.5)
Glucose: 93 mg/dL (ref 65–99)
Potassium: 4.4 mmol/L (ref 3.5–5.2)
Sodium: 142 mmol/L (ref 134–144)
Total Protein: 5.1 g/dL — ABNORMAL LOW (ref 6.0–8.5)

## 2019-08-27 LAB — T3: T3, Total: 108 ng/dL (ref 71–180)

## 2019-08-27 LAB — LIPID PANEL
Chol/HDL Ratio: 3.8 ratio (ref 0.0–5.0)
Cholesterol, Total: 178 mg/dL (ref 100–199)
HDL: 47 mg/dL (ref >39)
LDL Chol Calc (NIH): 115 mg/dL — ABNORMAL HIGH (ref 0–99)
Triglycerides: 87 mg/dL (ref 0–149)
VLDL Cholesterol Cal: 16 mg/dL (ref 5–40)

## 2019-08-27 LAB — VITAMIN D 25 HYDROXY (VIT D DEFICIENCY, FRACTURES): Vit D, 25-Hydroxy: 71.4 ng/mL (ref 30.0–100.0)

## 2019-08-27 LAB — TSH: TSH: 2.06 u[IU]/mL (ref 0.450–4.500)

## 2019-08-27 LAB — HEMOGLOBIN A1C
Est. average glucose Bld gHb Est-mCnc: 114 mg/dL
Hgb A1c MFr Bld: 5.6 % (ref 4.8–5.6)

## 2019-08-27 LAB — T4, FREE: Free T4: 1.09 ng/dL (ref 0.82–1.77)

## 2019-08-31 ENCOUNTER — Encounter: Payer: Self-pay | Admitting: Family Medicine

## 2019-08-31 DIAGNOSIS — E785 Hyperlipidemia, unspecified: Secondary | ICD-10-CM | POA: Insufficient documentation

## 2019-09-03 ENCOUNTER — Encounter: Payer: PPO | Admitting: Family Medicine

## 2019-09-04 ENCOUNTER — Encounter (HOSPITAL_COMMUNITY): Payer: Self-pay | Admitting: Emergency Medicine

## 2019-09-04 ENCOUNTER — Emergency Department (HOSPITAL_COMMUNITY): Payer: PPO

## 2019-09-04 ENCOUNTER — Emergency Department (HOSPITAL_COMMUNITY)
Admission: EM | Admit: 2019-09-04 | Discharge: 2019-09-04 | Disposition: A | Discharge status: Home / Self Care | Payer: PPO | Attending: Emergency Medicine | Admitting: Emergency Medicine

## 2019-09-04 ENCOUNTER — Telehealth: Payer: Self-pay | Admitting: Family Medicine

## 2019-09-04 ENCOUNTER — Other Ambulatory Visit: Payer: Self-pay

## 2019-09-04 DIAGNOSIS — M5412 Radiculopathy, cervical region: Secondary | ICD-10-CM | POA: Diagnosis not present

## 2019-09-04 DIAGNOSIS — Z79899 Other long term (current) drug therapy: Secondary | ICD-10-CM | POA: Diagnosis not present

## 2019-09-04 DIAGNOSIS — I2510 Atherosclerotic heart disease of native coronary artery without angina pectoris: Secondary | ICD-10-CM | POA: Insufficient documentation

## 2019-09-04 DIAGNOSIS — I10 Essential (primary) hypertension: Secondary | ICD-10-CM | POA: Diagnosis not present

## 2019-09-04 DIAGNOSIS — R079 Chest pain, unspecified: Secondary | ICD-10-CM | POA: Diagnosis not present

## 2019-09-04 DIAGNOSIS — Z7982 Long term (current) use of aspirin: Secondary | ICD-10-CM | POA: Insufficient documentation

## 2019-09-04 DIAGNOSIS — R52 Pain, unspecified: Secondary | ICD-10-CM | POA: Diagnosis not present

## 2019-09-04 DIAGNOSIS — Z87891 Personal history of nicotine dependence: Secondary | ICD-10-CM | POA: Insufficient documentation

## 2019-09-04 DIAGNOSIS — M79603 Pain in arm, unspecified: Secondary | ICD-10-CM | POA: Diagnosis not present

## 2019-09-04 DIAGNOSIS — M542 Cervicalgia: Secondary | ICD-10-CM | POA: Diagnosis not present

## 2019-09-04 DIAGNOSIS — I700 Atherosclerosis of aorta: Secondary | ICD-10-CM | POA: Diagnosis not present

## 2019-09-04 DIAGNOSIS — Z85828 Personal history of other malignant neoplasm of skin: Secondary | ICD-10-CM | POA: Insufficient documentation

## 2019-09-04 LAB — BASIC METABOLIC PANEL
Anion gap: 7 (ref 5–15)
BUN: 17 mg/dL (ref 8–23)
CO2: 28 mmol/L (ref 22–32)
Calcium: 8.7 mg/dL — ABNORMAL LOW (ref 8.9–10.3)
Chloride: 107 mmol/L (ref 98–111)
Creatinine, Ser: 0.88 mg/dL (ref 0.61–1.24)
GFR calc Af Amer: >60 mL/min (ref >60)
GFR calc non Af Amer: >60 mL/min (ref >60)
Glucose, Bld: 105 mg/dL — ABNORMAL HIGH (ref 70–99)
Potassium: 4.3 mmol/L (ref 3.5–5.1)
Sodium: 142 mmol/L (ref 135–145)

## 2019-09-04 LAB — CBC
HCT: 45.1 % (ref 39.0–52.0)
Hemoglobin: 15.1 g/dL (ref 13.0–17.0)
MCH: 31.9 pg (ref 26.0–34.0)
MCHC: 33.5 g/dL (ref 30.0–36.0)
MCV: 95.3 fL (ref 80.0–100.0)
Platelets: 238 10*3/uL (ref 150–400)
RBC: 4.73 MIL/uL (ref 4.22–5.81)
RDW: 13.1 % (ref 11.5–15.5)
WBC: 6.8 10*3/uL (ref 4.0–10.5)
nRBC: 0 % (ref 0.0–0.2)

## 2019-09-04 LAB — TROPONIN I (HIGH SENSITIVITY)
Troponin I (High Sensitivity): 6 ng/L (ref <18)
Troponin I (High Sensitivity): 5 ng/L (ref <18)

## 2019-09-04 LAB — I-STAT CREATININE, ED: Creatinine, Ser: 0.8 mg/dL (ref 0.61–1.24)

## 2019-09-04 MED ORDER — ACETAMINOPHEN 500 MG PO TABS
1000.0000 mg | ORAL_TABLET | Freq: Once | ORAL | Status: AC
  Administered 2019-09-04: 11:00:00 1000 mg via ORAL
  Filled 2019-09-04: qty 2

## 2019-09-04 MED ORDER — DICLOFENAC SODIUM 1 % EX GEL
4.0000 g | Freq: Once | CUTANEOUS | Status: AC
  Administered 2019-09-04: 4 g via TOPICAL
  Filled 2019-09-04: qty 100

## 2019-09-04 MED ORDER — SODIUM CHLORIDE 0.9% FLUSH: 3.0000 mL | Freq: Once | INTRAVENOUS | Status: DC

## 2019-09-04 MED ORDER — IOHEXOL 350 MG/ML SOLN
100.0000 mL | Freq: Once | INTRAVENOUS | Status: AC | PRN
  Administered 2019-09-04: 13:00:00 100 mL via INTRAVENOUS

## 2019-09-04 NOTE — Discharge Instructions (Signed)
Take tylenol 1000mg (2 extra strength) four times a day.  Then use the gel as prescribed.  Please follow-up with your family doctor in the office.  They need to address the thyroid nodule that was seen on CT scan as well as the adrenal nodules.  These may need further imaging as an outpatient.  Please return for worsening pain or if you have weakness to the arm.  Please discuss your high blood pressure with your family doctor.  This could be caused by your pain.

## 2019-09-04 NOTE — Telephone Encounter (Signed)
Patient calling office this morning with complaint of L arm soreness and tingling down to finger tips with BP reading of 170/80. Patient states he 'just doesn't feel right' and wanted advise on what to do.  I advised patient that with the symptoms he is presenting this could be a cardiac episode and for him to hang up with me and call 911 to go to ED for evaluation. I also advised patient if he had a low dose aspirin in the house to take one. Patient verbalized understanding and said he was going to call 911. AS, CMA

## 2019-09-04 NOTE — ED Triage Notes (Signed)
Pt to triage via [facility]> yesterday started having neck,  L scapula and L arm pain.  Tingling to L fingers today.  Denies chest pain, SOB, nausea.  Pain worse when lying on that side.

## 2019-09-04 NOTE — ED Provider Notes (Signed)
[facility] EMERGENCY DEPARTMENT Provider Note   CSN: [id number] Arrival date & time: 09/04/19  [id number]     History   Chief Complaint Chief Complaint  Patient presents with   Back Pain   Arm Pain    HPI Larry Harris is a 78 y.o. male.     78 yo M with a chief complaints of left-sided neck pain that radiates down to his arm.  Been going on for a couple days.  Worse with extreme left rotation of his neck.  Denies trauma denies injury to the area.  Has had some chest pain off and on that he describes as tinges.  Usually left-sided.  Has had some transient left-sided numbness and tingling.  This is worst in the second through fifth digits of his hand.  Denies shortness of breath.  He checked his blood pressure earlier today and realized that it was elevated.  Typically has normal blood pressure.  Called his family doctor who told him to call 911 and come immediately to the emergency department.  The history is provided by the patient.  Back Pain Associated symptoms: no abdominal pain, no chest pain, no fever and no headaches   Arm Pain Pertinent negatives include no chest pain, no abdominal pain, no headaches and no shortness of breath.  Illness Severity:  Severe Onset quality:  Gradual Duration:  2 days Timing:  Constant Progression:  Worsening Chronicity:  New Associated symptoms: no abdominal pain, no chest pain, no congestion, no diarrhea, no fever, no headaches, no myalgias, no rash, no shortness of breath and no vomiting     Past Medical History:  Diagnosis Date   Acute rhinitis    Allergic rhinitis, cause unspecified    Allergy    BPH (benign prostatic hyperplasia)    CAD (coronary artery disease)    Cancer (HCC)    scalp basal cell skin ca   Cataract    bilateral cateracts removed   Clotting disorder (HCC)    Hypercholesteremia    Hypotension    Neuralgia, neuritis, and radiculitis, unspecified    Osteoarthritis    Spinal  stenosis of lumbar region 06/04/2014   Thrombocytopenia, unspecified ([place])     Patient Active Problem List   Diagnosis Date Noted   HLD (hyperlipidemia) 08/31/2019   Hypertension 05/22/2019   CAD (coronary artery disease)    Cancer (HCC)    Thrombocytopenia, unspecified ([place])    Pharyngitis 09/04/2018   Acute maxillary sinusitis 10/23/2017   Injury of right elbow 05/02/2017   Low platelet count- chronically 08/01/2016   BPH (benign prostatic hyperplasia) 02/15/2016   Overweight (BMI 25.0-29.9) 02/15/2016   Spinal stenosis of lumbar region 06/04/2014    Past Surgical History:  Procedure Laterality Date   APPENDECTOMY  1958   bilateral cateracts removed     [place]   x2        Home Medications    Prior to Admission medications   Medication Sig Start Date End Date Taking? Authorizing Provider  aspirin 325 MG tablet Take 325 mg by mouth daily.      [provider]  b complex vitamins tablet Take 1 tablet by mouth daily.    [provider]  calcium carbonate (OS-CAL) 600 MG TABS Take 600 mg by mouth daily.      [provider]  diphenhydrAMINE (BENADRYL) 25 mg capsule Take 25 mg by mouth as needed (insomnia).    [provider]  doxazosin (CARDURA) 2 MG tablet TAKE 1 TABLET BY MOUTH AT BEDTIME. 05/22/19   Opalski, Deborah, DO  fluticasone (FLONASE) 50 MCG/ACT nasal spray SPRAY 2 SPRAYS INTO EACH NOSTRIL EVERY DAY 01/07/19   Opalski, Neoma Laming, DO  gabapentin (NEURONTIN) 300 MG capsule TAKE 1 CAPSULE BY MOUTH EVERY MORNING AND 2 CAPSULES BY MOUTH BEFORE BEDTIME 07/01/19   Opalski, Neoma Laming, DO  hydrocortisone (ANUSOL-HC) 25 MG suppository Place 1 suppository (25 mg total) rectally 2 (two) times daily. 08/24/17   Mellody Dance, DO  Multiple Vitamin (MULTIVITAMIN) capsule Take 1 capsule by mouth daily.      [provider]  naproxen sodium (ANAPROX) 220 MG tablet Take 220 mg by mouth 2 (two)  times daily with a meal. Sciatic nerve pain    [provider]  vitamin B-12 (CYANOCOBALAMIN) 1000 MCG tablet Take 1,000 mcg by mouth daily.      [provider]  VITAMIN D, CHOLECALCIFEROL, PO Take 1,000 Units by mouth daily.     [provider]    Family History Family History  Problem Relation Age of Onset   Stroke Mother 10   Prostate cancer Father 33   Heart failure Brother 43   Lung cancer Sister    Colon cancer Neg Hx    Esophageal cancer Neg Hx    Rectal cancer Neg Hx    Stomach cancer Neg Hx     Social History Social History   Tobacco Use   Smoking status: Former Smoker    Packs/day: 1.00    Years: 25.00    Pack years: 25.00    Types: Cigarettes    Quit date: 07/27/1994    Years since quitting: 25.1   Smokeless tobacco: Never Used  Substance Use Topics   Alcohol use: Yes    Alcohol/week: 1.0 standard drinks    Types: 1 Glasses of wine per week    Comment: occasional   Drug use: No     Allergies   Patient has no known allergies.   Review of Systems Review of Systems  Constitutional: Negative for chills and fever.  HENT: Negative for congestion and facial swelling.   Eyes: Negative for discharge and visual disturbance.  Respiratory: Negative for shortness of breath.   Cardiovascular: Negative for chest pain and palpitations.  Gastrointestinal: Negative for abdominal pain, diarrhea and vomiting.  Musculoskeletal: Positive for back pain and neck pain. Negative for arthralgias and myalgias.  Skin: Negative for color change and rash.  Neurological: Negative for tremors, syncope and headaches.  Psychiatric/Behavioral: Negative for confusion and dysphoric mood.     Physical Exam Updated Vital Signs BP (!) 172/87    Pulse 73    Temp 97.8 F (36.6 C) (Oral)    Resp 13    SpO2 99%   Physical Exam Vitals signs and nursing note reviewed.  Constitutional:      Appearance: He is well-developed.  HENT:     Head:  Normocephalic and atraumatic.  Eyes:     Pupils: Pupils are equal, round, and reactive to light.  Neck:     Musculoskeletal: Normal range of motion and neck supple.     Vascular: No JVD.  Cardiovascular:     Rate and Rhythm: Normal rate and regular rhythm.     Heart sounds: No murmur. No friction rub. No gallop.   Pulmonary:     Effort: No respiratory distress.     Breath sounds: No wheezing.  Abdominal:     General: There is  no distension.     Tenderness: There is no guarding or rebound.  Musculoskeletal: Normal range of motion.  Skin:    Coloration: Skin is not pale.     Findings: No rash.  Neurological:     Mental Status: He is alert and oriented to person, place, and time.     GCS: GCS eye subscore is 4. GCS verbal subscore is 5. GCS motor subscore is 6.     Cranial Nerves: Cranial nerves are intact.     Comments: Pulse motor and sensation intact to the left upper extremity.  Pulses equal to the right.  Full range of motion the shoulder without pain.  No appreciable weakness.  Psychiatric:        Behavior: Behavior normal.      ED Treatments / Results  Labs (all labs ordered are listed, but only abnormal results are displayed) Labs Reviewed  BASIC METABOLIC PANEL - Abnormal; Notable for the following components:      Result Value   Glucose, Bld 105 (*)    Calcium 8.7 (*)    All other components within normal limits  CBC  I-STAT CREATININE, ED  TROPONIN I (HIGH SENSITIVITY)  TROPONIN I (HIGH SENSITIVITY)    EKG EKG Interpretation  Date/Time:  Wednesday September 04 2019 10:03:48 EST Ventricular Rate:  75 PR Interval:  140 QRS Duration: 144 QT Interval:  396 QTC Calculation: 442 R Axis:   30 Text Interpretation: Normal sinus rhythm Right bundle branch block T wave abnormality, consider inferolateral ischemia Abnormal ECG No old tracing to compare Confirmed by Deno Etienne [phone]) on 09/04/2019 10:09:55 AM   Radiology Ct Cervical Spine Wo Contrast  Result  Date: 09/04/2019 CLINICAL DATA:  Left neck, shoulder and arm pain beginning yesterday. EXAM: CT CERVICAL SPINE WITHOUT CONTRAST TECHNIQUE: Multidetector CT imaging of the cervical spine was performed without intravenous contrast. Multiplanar CT image reconstructions were also generated. COMPARISON:  None. FINDINGS: Alignment: Normal. Skull base and vertebrae: There is mild spondylosis throughout the cervical spine. Vertebral body heights are maintained. There is uncovertebral joint spurring and facet arthropathy. Mild bilateral neural foraminal narrowing at the C5-6 level left worse than right. Minimal bilateral neural foraminal narrowing at the C6-7 level. Minimal bilateral neural foraminal narrowing at the C4-5 level. No acute fracture or subluxation. Soft tissues and spinal canal: Prevertebral soft tissues are normal. No significant canal stenosis. Disc levels:  Disc space narrowing at the C5-6 level Upper chest: No acute findings. Other: 1.6 cm hypodense nodule over the right lobe of the thyroid. IMPRESSION: 1.  No acute findings. 2. Mild spondylosis throughout the cervical spine with disc disease at the C5-6 level. Mild bilateral neural foraminal narrowing at multiple levels as described from the C4-5 level to the C6-7 level as this is worse along the left side at the C5-6 level. 3. 1.6 cm hypodense nodule over the right lobe of the thyroid. Recommend further evaluation with thyroid ultrasound. Reference: Recommendations for f/u of Incidental Thyroid Nodules (ITN) found on CT, MR, NM and Extrathyroidal [place] are based upon the ACR white paper and [facility] 3-tiered system for managing ITNs:J Am Coll Radiol. 2015 Feb;12(2): 143-50 Electronically Signed   By: Marin Olp M.D.   On: 09/04/2019 13:28   Ct Angio Chest/abd/pel For Dissection W And/or Wo Contrast  Result Date: 09/04/2019 CLINICAL DATA:  Acute left neck, shoulder, chest and back pain since yesterday with elevated blood pressure. Aortic dissection  suspected. EXAM: CT ANGIOGRAPHY CHEST, ABDOMEN AND PELVIS TECHNIQUE: Multidetector  CT imaging through the chest, abdomen and pelvis was performed using the standard protocol during bolus administration of intravenous contrast. Multiplanar reconstructed images and MIPs were obtained and reviewed to evaluate the vascular anatomy. CONTRAST:  161mL OMNIPAQUE IOHEXOL 350 MG/ML SOLN COMPARISON:  None. FINDINGS: CTA CHEST FINDINGS Cardiovascular: Pre contrast images demonstrate no displaced intimal calcifications. Following contrast, the aorta opacifies normally. There is no evidence of aortic dissection or aneurysm. The pulmonary arteries are well opacified with contrast. There is aortic and great vessel atherosclerosis. The left vertebral artery arises directly from the aortic arch. The heart size is normal. There is no pericardial effusion. Mediastinum/Nodes: There are no enlarged mediastinal, hilar or axillary lymph nodes.No evidence of mediastinal hematoma. Projecting posteriorly from the right thyroid lobe is a partially calcified nodule which measures 2.1 x 1.4 cm on image 6/15. This enhances following contrast. The trachea and esophagus demonstrate no significant findings. Lungs/Pleura: There is no pleural effusion or pneumothorax. Mild centrilobular emphysema and dependent atelectasis in both lungs. No suspicious pulmonary nodularity. Musculoskeletal/Chest wall: There is no chest wall mass or suspicious osseous finding. Review of the MIP images confirms the above findings. CTA ABDOMEN AND PELVIS FINDINGS VASCULAR Aorta: Moderate atherosclerosis without evidence of aneurysm or dissection. Celiac: Atherosclerosis without aneurysm or large vessel occlusion. The right common hepatic artery arises separately from the aorta. SMA: Widely patent. Renals: Widely patent. There are accessory renal arteries bilaterally. IMA: Patent. Inflow: Mild atherosclerosis without aneurysm or dissection. Postsurgical changes in the right  groin. Veins: Suboptimally opacified. No apparent abnormality. Review of the MIP images confirms the above findings. NON-VASCULAR Hepatobiliary: Small cyst in the left hepatic lobe. No suspicious hepatic lesion or abnormal enhancement. There is no evidence of gallstones, gallbladder wall thickening or biliary dilatation. Pancreas: No evidence of pancreatic mass, ductal dilatation or surrounding inflammation. Spleen: Normal in size without focal abnormality. Adrenals/Urinary Tract: There are small bilateral adrenal nodules measuring 10 mm on the right (103/16) and 17 mm on the left (image 102/16). These have a nonspecific attenuation. Probable bilateral extrarenal pelves without perinephric soft tissue stranding or urinary tract calculus. Mild diffuse bladder wall thickening. Stomach/Bowel: No enteric contrast was administered. There is no bowel wall thickening, significant distention or surrounding inflammation. Lymphatic: No enlarged abdominopelvic lymph nodes. No retroperitoneal hematoma. Reproductive: The prostate gland is markedly enlarged and heterogeneous. No focal mass lesion apparent. Other: Postsurgical changes in the right inguinal region. No ascites. Musculoskeletal: No evidence of acute fracture or focal osseous lesion. Mild lumbar spondylosis. Review of the MIP images confirms the above findings. IMPRESSION: 1. No evidence of aortic dissection or other acute vascular findings. 2. Atherosclerosis and vascular anatomic variants as described without significant stenosis. 3. Enhancing 2.1 x 1.4 cm exophytic right thyroid nodule. Recommend thyroid US.(Ref: J Am Coll Radiol. 2015 Feb;12(2): 143-50). 4. Indeterminate bilateral adrenal nodules, statistically adenomas. Consider noncontrast follow-up abdominal CT in 6-12 months to better characterize. 5. Prostatomegaly with mild diffuse bladder wall thickening. 6. Aortic Atherosclerosis (ICD10-I70.0) and Emphysema (ICD10-J43.9). Electronically Signed   By:  Richardean Sale M.D.   On: 09/04/2019 13:27    Procedures Procedures (including critical care time)  Medications Ordered in ED Medications  sodium chloride flush (NS) 0.9 % injection 3 mL (has no administration in time range)  acetaminophen (TYLENOL) tablet 1,000 mg (1,000 mg Oral Given 09/04/19 1124)  diclofenac Sodium (VOLTAREN) 1 % topical gel 4 g (4 g Topical Given 09/04/19 1127)  iohexol (OMNIPAQUE) 350 MG/ML injection 100 mL (100 mLs Intravenous Contrast  Given 09/04/19 1301)     Initial Impression / Assessment and Plan / ED Course  I have reviewed the triage vital signs and the nursing notes.  Pertinent labs & imaging results that were available during my care of the patient were reviewed by me and considered in my medical decision making (see chart for details).        78 yo M with a cc of left-sided neck pain that radiates down the arm.  This sounds like a radiculopathy.  No obvious trauma to the neck.  Patient is mostly concerned about his hypertension.  His blood pressure here is 193/74.  He has a benign neurologic exam making it unlikely to be a stroke.  Has clear lungs making it unlikely to be acute pulmonary edema.  Dissection is unlikely though could be in the differential for intermittent chest pain and left-sided neck pain that goes down the arm.  I discussed this with the patient and he would like to have this evaluated.  His EKG is abnormal, there are none initially seen in use though if I look in chart review I am able to pull up an old EKG that has a similar morphology though not in his many leads, ST depression in the inferior and lateral leads.  Imaging study is negative.  Delta tropes negative.  Discharge patient home.  3:03 PM:  I have discussed the diagnosis/risks/treatment options with the patient and believe the pt to be eligible for discharge home to follow-up with PCP. We also discussed returning to the ED immediately if new or worsening sx occur. We discussed  the sx which are most concerning (e.g., sudden worsening pain, fever, inability to tolerate by mouth) that necessitate immediate return. Medications administered to the patient during their visit and any new prescriptions provided to the patient are listed below.  Medications given during this visit Medications  sodium chloride flush (NS) 0.9 % injection 3 mL (has no administration in time range)  acetaminophen (TYLENOL) tablet 1,000 mg (1,000 mg Oral Given 09/04/19 1124)  diclofenac Sodium (VOLTAREN) 1 % topical gel 4 g (4 g Topical Given 09/04/19 1127)  iohexol (OMNIPAQUE) 350 MG/ML injection 100 mL (100 mLs Intravenous Contrast Given 09/04/19 1301)     The patient appears reasonably screen and/or stabilized for discharge and I doubt any other medical condition or other [facility] requiring further screening, evaluation, or treatment in the ED at this time prior to discharge.    Final Clinical Impressions(s) / ED Diagnoses   Final diagnoses:  Cervical radiculopathy    ED Discharge Orders    None       Deno Etienne, DO 09/04/19 1503

## 2019-09-04 NOTE — Telephone Encounter (Signed)
Good, thnx

## 2019-09-10 ENCOUNTER — Other Ambulatory Visit: Payer: Self-pay | Admitting: Family Medicine

## 2019-09-10 ENCOUNTER — Encounter: Payer: Self-pay | Admitting: Family Medicine

## 2019-09-10 DIAGNOSIS — N400 Enlarged prostate without lower urinary tract symptoms: Secondary | ICD-10-CM

## 2019-10-11 ENCOUNTER — Encounter: Payer: Self-pay | Admitting: Family Medicine

## 2019-10-11 ENCOUNTER — Other Ambulatory Visit: Payer: Self-pay

## 2019-10-11 ENCOUNTER — Ambulatory Visit (INDEPENDENT_AMBULATORY_CARE_PROVIDER_SITE_OTHER): Payer: PPO | Admitting: Family Medicine

## 2019-10-11 VITALS — BP 171/83 | HR 68 | Temp 97.4°F | Resp 12 | Ht 71.0 in | Wt 196.4 lb

## 2019-10-11 DIAGNOSIS — E278 Other specified disorders of adrenal gland: Secondary | ICD-10-CM

## 2019-10-11 DIAGNOSIS — E041 Nontoxic single thyroid nodule: Secondary | ICD-10-CM

## 2019-10-11 DIAGNOSIS — E279 Disorder of adrenal gland, unspecified: Secondary | ICD-10-CM | POA: Insufficient documentation

## 2019-10-11 DIAGNOSIS — Z0000 Encounter for general adult medical examination without abnormal findings: Secondary | ICD-10-CM

## 2019-10-11 DIAGNOSIS — Z719 Counseling, unspecified: Secondary | ICD-10-CM | POA: Diagnosis not present

## 2019-10-11 NOTE — Progress Notes (Signed)
Male physical- pt understands difference btwn CPE and Medicare wellness.  Pt states he desires CPE irregardless of cost  Impression and Recommendations:    1. Encounter for wellness examination   2. Health education/counseling   3. Adrenal nodule ([place])- new onset, seen on CT scan done 09-04-19   4. Thyroid nodule-   R-  new onset, seen on CT scan done 09-04-19     -  During CPE today, pt mentions he had a recent ED visit in early Dec and never had a ED f/up ov with [place] regarding it.   - He tells me he has no concerns or further symptoms but wants me to review CT scans with new concerning findings and was told to f/up with PCP regarding this   Thyroid Nodule, R - Adrenal Nodule - New Onset, Seen on CT scan 09/04/2019 - On CT angio chest/abdomen/pelvis in ER, right thyroid nodule seen, along with indeterminate bilateral adrenal nodules.   " 3. Enhancing 2.1 x 1.4 cm exophytic right thyroid nodule. Recommend thyroid US.(Ref: J Am Coll Radiol. 2015 Feb;12(2): 143-50). 4. Indeterminate bilateral adrenal nodules, statistically adenomas. Consider noncontrast follow-up abdominal CT in 6-12 months to better Characterize. "  -Patient with questions about clinical implications of these findings.  After much discussion, we decided that consultation with specialist would be best at this time. - Ambulatory referral to endocrinology placed today.    - Will continue to monitor alongside specialist.    1) Anticipatory Guidance: Discussed importance of wearing a seatbelt while driving, not texting while driving;   sunscreen when outside along with skin surveillance; eating a balanced and modest diet; physical activity at least 25 minutes per day or 150 min/ week moderate to intense activity.  - Prudent toenail care discussed with patient today.  - Given history of skin cancer, patient will continue to obtain prudent follow-up with dermatology as established.  - Encouraged patient to monitor  his BP at home, and bring a log to next OV.  2) Immunizations / Screenings / Labs:   All immunizations are up-to-date per recommendations or will be updated today. Patient is due for dental and vision screens which pt will schedule independently. Will obtain CBC, CMP, HgA1c, Lipid panel, TSH and vit D when fasting, if not already done recently.   - Last colonoscopy obtained at age 70, 06/30/2015 by Dr. Henrene Pastor of Gastroenterology, was completely WNL.  Given his age, patient does not need follow-up colonoscopy.  Discussed recommendations with patient during appointment today.  - Patient is a former smoker with long history of smoking (25 pack-years) and cardiac history.  On 09/04/2019, patient obtained CT angio/chest/abdomen/pelvis, no suspicious pulmonary nodularity visualized.  - AAA screening last obtained 6 of 2019.  - TDAP vaccination up to date.  - Shingles vaccination up to date.  - Pneumonia vaccinations up to date.  - Influenza vaccination up to date.  - Patient has obtained first dose of COVID-19 vaccination.  - Last full labs obtained 08/26/2019.  3) Health Counseling & Preventative Maintenance:   BMI meaning discussed with patient.  Discussed goal of losing 5-10% of current body weight which would improve overall feelings of well being and improve objective health data. Improve nutrient density of diet through increasing intake of fruits and vegetables and decreasing saturated fats, white flour products and refined sugars.   - Advised patient to continue working toward daily exercise to preserve memory, help prevent physical deterioration, and improve overall mental, physical, and  emotional health.  - Reviewed the "spokes of the wheel" of mood and health management.  Stressed the importance of ongoing prudent habits, including regular exercise, appropriate sleep hygiene, healthful dietary habits, and prayer/meditation to relax.  - Encouraged patient to engage in daily physical  activity as tolerated, especially a formal exercise routine.  Recommended that the patient eventually strive for at least 150 minutes of moderate cardiovascular activity per week according to guidelines established by the [facility].   - Heart healthy dietary habits encouraged, including low-carb, and high amounts of lean protein in diet.   - Patient should also consume adequate amounts of water.  - Health counseling performed.  All questions answered.    Orders Placed This Encounter  Procedures  . Ambulatory referral to Endocrinology    Referral Priority:   Routine    Referral Type:   Consultation    Referral Reason:   Specialty Services Required    Number of Visits Requested:   1    Return for f/up 70mo for chronic care or sooner if issues arise.   Reminded pt important of f-up preventative CPE in 1 year.  Reminded pt again, this is in addition to any chronic care visits.    Gross side effects, risk and benefits, and alternatives of medications discussed with patient.  Patient is aware that all medications have potential side effects and we are unable to predict every side effect or drug-drug interaction that may occur.  Expresses verbal understanding and consents to current therapy plan and treatment regimen.  Please see AVS handed out to patient at the end of our visit for further patient instructions/ counseling done pertaining to today's office visit.   This document serves as a record of services personally performed by Mellody Dance, DO. It was created on her behalf by Toni Amend, a trained medical scribe. The creation of this record is based on the scribe's personal observations and the provider's statements to them.   This case required medical decision making of at least moderate complexity. The above documentation has been reviewed to be accurate and was completed by Marjory Sneddon, D.O.       Subjective:    CC: CPE  HPI: Larry Harris is a 79 y.o.  male who presents to [place] [facility] today for a yearly health maintenance exam.    Health Maintenance Summary Reviewed and updated, unless pt declines services.  Colonoscopy:  Last colonoscopy obtained at age 72, 06/30/2015 by Dr. Henrene Pastor of Gastroenterology, was completely WNL.  Given his age, patient does not need follow-up colonoscopy. Tobacco History Reviewed:  Former smoker; 25 pack-years.  States he has never smoked more than 30 pack-years. CT scan for screening lung CA:  Patient does not qualify for this screening. Abdominal Ultrasound:  Last abdominal ultrasound obtained June of 2019. Alcohol:  No concerns, no excessive use.  Thinks he has a glass of wine four times per month. Exercise Habits:  Not meeting AHA guidelines.  He walks about a half mile every morning. STD concerns:  None reported. Drug Use:  None reported. Birth control method:  None reported. Testicular/penile concerns:  No concerns reported.  Notes obtained recent CT imaging in the emergency room (09/04/2019) due to acute left neck, shoulder, chest and back pain with elevated blood pressure.  Patient notes he thought he was having a heart attack.  When he called in with his symptoms, he was told to call 911 and obtain urgent assessment.  -  COVID-19 Vaccination Obtained first immunization for COVID-19 this past Tuesday, January 12; obtains second dose of vaccination on February 9th.  - Alpha up with Dr. Parks Neptune once yearly; notes he has "pushed it back" due to [place] his last visit was in September; obtains follow-up with eye doctor once yearly.  - Dental Health Goes to the dentist every six months.  Notes "behind because I backed stuff up because of the [place]."  - [place] In the last two weeks, denies new chest pain, SOB, new intolerances to exercise.  - Urinary Health Denies new urinary symptoms; denies excessive urination during  sleep.  Notes he urinates more in the mornings after drinking coffee.  - Digestive Health Notes no concerns with bowel movements or GI tract in general.  "I know I've got hemorrhoids, but not bleeding lately."   Immunization History  Administered Date(s) Administered  . Influenza, High Dose Seasonal PF 06/20/2017, 07/17/2018, 06/10/2019  . Influenza-Unspecified 06/27/2015, 07/12/2016, 06/20/2017, 07/17/2018  . Pneumococcal Conjugate-13 08/25/2017  . Pneumococcal Polysaccharide-23 08/01/2016  . Tdap 08/01/2016  . Zoster 09/27/2011  . Zoster Recombinat (Shingrix) 03/26/2018, 08/02/2018    Health Maintenance  Topic Date Due  . TETANUS/TDAP  08/01/2026  . INFLUENZA VACCINE  Completed  . PNA vac Low Risk Adult  Completed      Wt Readings from Last 3 Encounters:  10/11/19 196 lb 6.4 oz (89.1 kg)  07/04/19 189 lb 6.4 oz (85.9 kg)  06/26/19 190 lb (86.2 kg)   BP Readings from Last 3 Encounters:  10/11/19 (!) 171/83  09/04/19 (!) 172/87  07/04/19 120/82   Pulse Readings from Last 3 Encounters:  10/11/19 68  09/04/19 73  07/04/19 86    Patient Active Problem List   Diagnosis Date Noted  . HLD (hyperlipidemia) 08/31/2019  . Hypertension 05/22/2019  . CAD (coronary artery disease)   . BPH (benign prostatic hyperplasia) 02/15/2016  . Spinal stenosis of lumbar region 06/04/2014  . Cancer ([place])   . Thrombocytopenia, unspecified ([place])   . Low platelet count- chronically 08/01/2016  . Adrenal nodule ([place])- new onset, seen on CT scan done 09-04-19 10/11/2019  . Thyroid nodule-   R-  new onset, seen on CT scan done 09-04-19 10/11/2019  . Pharyngitis 09/04/2018  . Acute maxillary sinusitis 10/23/2017  . Injury of right elbow 05/02/2017  . Overweight (BMI 25.0-29.9) 02/15/2016    Past Medical History:  Diagnosis Date  . Acute rhinitis   . Allergic rhinitis, cause unspecified   . Allergy   . BPH (benign prostatic hyperplasia)   . CAD (coronary artery disease)   . Cancer  ([place])    scalp basal cell skin ca  . Cataract    bilateral cateracts removed  . Clotting disorder ([place])   . Hypercholesteremia   . Hypotension   . Neuralgia, neuritis, and radiculitis, unspecified   . Osteoarthritis   . Spinal stenosis of lumbar region 06/04/2014  . Thrombocytopenia, unspecified ([place])     Past Surgical History:  Procedure Laterality Date  . APPENDECTOMY  1958  . bilateral cateracts removed    . COLONOSCOPY    . HERNIA REPAIR  1965   x2    Family History  Problem Relation Age of Onset  . Stroke Mother 3  . Prostate cancer Father 15  . Heart failure Brother 8  . Lung cancer Sister   . Colon cancer Neg Hx   . Esophageal cancer Neg Hx   .  Rectal cancer Neg Hx   . Stomach cancer Neg Hx     Social History   Substance and Sexual Activity  Drug Use No  ,  Social History   Substance and Sexual Activity  Alcohol Use Yes  . Alcohol/week: 1.0 standard drinks  . Types: 1 Glasses of wine per week   Comment: occasional  ,  Social History   Tobacco Use  Smoking Status Former Smoker  . Packs/day: 1.00  . Years: 25.00  . Pack years: 25.00  . Types: Cigarettes  . Quit date: 07/27/1994  . Years since quitting: 25.2  Smokeless Tobacco Never Used  ,  Social History   Substance and Sexual Activity  Sexual Activity Not on file    Patient's Medications  New Prescriptions   No medications on file  Previous Medications   ASPIRIN 325 MG TABLET    Take 325 mg by mouth daily.     B COMPLEX VITAMINS TABLET    Take 1 tablet by mouth daily.   CALCIUM CARBONATE (OS-CAL) 600 MG TABS    Take 600 mg by mouth daily.     DIPHENHYDRAMINE (BENADRYL) 25 MG CAPSULE    Take 25 mg by mouth as needed (insomnia).   DOXAZOSIN (CARDURA) 2 MG TABLET    TAKE 1 TABLET BY MOUTH EVERYDAY AT BEDTIME   FLUTICASONE (FLONASE) 50 MCG/ACT NASAL SPRAY    SPRAY 2 SPRAYS INTO EACH NOSTRIL EVERY DAY   GABAPENTIN (NEURONTIN) 300 MG CAPSULE    TAKE 1 CAPSULE BY MOUTH EVERY MORNING AND 2  CAPSULES BY MOUTH BEFORE BEDTIME   HYDROCORTISONE (ANUSOL-HC) 25 MG SUPPOSITORY    Place 1 suppository (25 mg total) rectally 2 (two) times daily.   MULTIPLE VITAMIN (MULTIVITAMIN) CAPSULE    Take 1 capsule by mouth daily.     NAPROXEN SODIUM (ANAPROX) 220 MG TABLET    Take 220 mg by mouth 2 (two) times daily with a meal. Sciatic nerve pain   VITAMIN B-12 (CYANOCOBALAMIN) 1000 MCG TABLET    Take 1,000 mcg by mouth daily.     VITAMIN D, CHOLECALCIFEROL, PO    Take 1,000 Units by mouth daily.   Modified Medications   No medications on file  Discontinued Medications   No medications on file    Patient has no known allergies.  Review of Systems: General:   Denies fever, chills, unexplained weight loss.  Optho/Auditory:   Denies visual changes, blurred vision/LOV Respiratory:   Denies SOB, DOE more than baseline levels.   Cardiovascular:   Denies chest pain, palpitations, new onset peripheral edema  Gastrointestinal:   Denies nausea, vomiting, diarrhea.  Genitourinary: Denies dysuria, freq/ urgency, flank pain or discharge from genitals.  Endocrine:     Denies hot or cold intolerance, polyuria, polydipsia. Musculoskeletal:   Denies unexplained myalgias, joint swelling, unexplained arthralgias, gait problems.  Skin:  Denies rash, suspicious lesions Neurological:     Denies dizziness, unexplained weakness, numbness  Psychiatric/Behavioral:   Denies mood changes, suicidal or homicidal ideations, hallucinations    Objective:     Blood pressure (!) 171/83, pulse 68, temperature (!) 97.4 F (36.3 C), temperature source Oral, resp. rate 12, height 5\' 11"  (1.803 m), weight 196 lb 6.4 oz (89.1 kg), SpO2 98 %. Body mass index is 27.39 kg/m. General Appearance:    Alert, cooperative, no distress, appears stated age  Head:    Normocephalic, without obvious abnormality, atraumatic  Eyes:    PERRL, conjunctiva/corneas clear, EOM's intact, fundi  benign, both eyes  Ears:    Normal TM's and  external ear canals, both ears  Nose:   Nares normal, septum midline, mucosa normal, no drainage    or sinus tenderness  Throat:   Lips w/o lesion, mucosa moist, and tongue normal; teeth and   gums normal  Neck:   Neck supple, symmetrical, trachea midline, no adenopathy;   thyroid:  Despite findings indicated in recent imaging, no masses or abnormality appreciated.  no enlargement/tenderness/nodules; no carotid bruit or JVD  Back:     Symmetric, no curvature, ROM normal, no CVA tenderness  Lungs:     Clear to auscultation bilaterally, respirations unlabored, no       Wh/ R/ R  Chest Wall:    No tenderness or gross deformity; normal excursion   Heart:    Regular rate and rhythm, S1 and S2 normal, no murmur, rub   or gallop  Abdomen:     Soft, non-tender, bowel sounds active all four quadrants, NO   G/R/R, no masses, no organomegaly  Genitalia:   Ext genitalia: without lesion, no penile rash or discharge, no hernias appreciated   Rectal:    two external hemorrhoids, non-inflamed, not bleeding.  Normal tone, right lobe of prostate slightly enlarged vs left, but smooth, without irregularities. No tenderness; guaiac negative stool.  Extremities:   Dystrophic and yellowish thickened nails in toenails bilaterally.  Extremities normal, atraumatic, no cyanosis or gross edema  Pulses:   2+ and symmetric all extremities  Skin:   Warm, dry, Skin color, texture, turgor normal, no obvious rashes or lesions  M-Sk:   Ambulates * 4 w/o difficulty, no gross deformities, tone WNL  Neurologic:   CNII-XII intact, normal strength, sensation and reflexes    Throughout Psych:  No HI/SI, judgement and insight good, Euthymic mood. Full Affect.

## 2019-10-11 NOTE — Patient Instructions (Signed)
.  Preventive Care 81 Years and Older, Male Preventive care refers to lifestyle choices and visits with your health care provider that can promote health and wellness. What does preventive care include?   A yearly physical exam. This is also called an annual well check.  Dental exams once or twice a year.  Routine eye exams. Ask your health care provider how often you should have your eyes checked.  Personal lifestyle choices, including: ? Daily care of your teeth and gums. ? Regular physical activity. ? Eating a healthy diet. ? Avoiding tobacco and drug use. ? Limiting alcohol use. ? Practicing safe sex. ? Taking low doses of aspirin every day. ? Taking vitamin and mineral supplements as recommended by your health care provider. What happens during an annual well check? The services and screenings done by your health care provider during your annual well check will depend on your age, overall health, lifestyle risk factors, and family history of disease. Counseling Your health care provider may ask you questions about your:  Alcohol use.  Tobacco use.  Drug use.  Emotional well-being.  Home and relationship well-being.  Sexual activity.  Eating habits.  History of falls.  Memory and ability to understand (cognition).  Work and work Statistician. Screening You may have the following tests or measurements:  Height, weight, and BMI.  Blood pressure.  Lipid and cholesterol levels. These may be checked every 5 years, or more frequently if you are over 81 years old.  Skin check.  Lung cancer screening. You may have this screening every year starting at age 21 if you have a 30-pack-year history of smoking and currently smoke or have quit within the past 15 years.  Colorectal cancer screening. All adults should have this screening starting at age 81 and continuing until age 55. You will have tests every 1-10 years, depending on your results and the type of screening  test. People at increased risk should start screening at an earlier age. Screening tests may include: ? Guaiac-based fecal occult blood testing. ? Fecal immunochemical test (FIT). ? Stool DNA test. ? Virtual colonoscopy. ? Sigmoidoscopy. During this test, a flexible tube with a tiny camera (sigmoidoscope) is used to examine your rectum and lower colon. The sigmoidoscope is inserted through your anus into your rectum and lower colon. ? Colonoscopy. During this test, a long, thin, flexible tube with a tiny camera (colonoscope) is used to examine your entire colon and rectum.  Prostate cancer screening. Recommendations will vary depending on your family history and other risks.  Hepatitis C blood test.  Hepatitis B blood test.  Sexually transmitted disease (STD) testing.  Diabetes screening. This is done by checking your blood sugar (glucose) after you have not eaten for a while (fasting). You may have this done every 1-3 years.  Abdominal aortic aneurysm (AAA) screening. You may need this if you are a current or former smoker.  Osteoporosis. You may be screened starting at age 81 if you are at high risk. Talk with your health care provider about your test results, treatment options, and if necessary, the need for more tests. Vaccines Your health care provider may recommend certain vaccines, such as:  Influenza vaccine. This is recommended every year.  Tetanus, diphtheria, and acellular pertussis (Tdap, Td) vaccine. You may need a Td booster every 10 years.  Varicella vaccine. You may need this if you have not been vaccinated.  Zoster vaccine. You may need this after age 81.  Measles, mumps, and rubella (  MMR) vaccine. You may need at least one dose of MMR if you were born in 1957 or later. You may also need a second dose.  Pneumococcal 13-valent conjugate (PCV13) vaccine. One dose is recommended after age 81.  Pneumococcal polysaccharide (PPSV23) vaccine. One dose is recommended  after age 81.  Meningococcal vaccine. You may need this if you have certain conditions.  Hepatitis A vaccine. You may need this if you have certain conditions or if you travel or work in places where you may be exposed to hepatitis A.  Hepatitis B vaccine. You may need this if you have certain conditions or if you travel or work in places where you may be exposed to hepatitis B.  Haemophilus influenzae type b (Hib) vaccine. You may need this if you have certain risk factors. Talk to your health care provider about which screenings and vaccines you need and how often you need them. This information is not intended to replace advice given to you by your health care provider. Make sure you discuss any questions you have with your health care provider. Document Released: 10/09/2015 Document Revised: 11/02/2017 Document Reviewed: 07/14/2015 Elsevier Interactive Patient Education  2019 [place] for Adults, Male A healthy lifestyle and preventive care can promote health and wellness. Preventive health guidelines for men include the following key practices:  A routine yearly physical is a good way to check with your health care provider about your health and preventative screening. It is a chance to share any concerns and updates on your health and to receive a thorough exam.  Visit your dentist for a routine exam and preventative care every 6 months. Brush your teeth twice a day and floss once a day. Good oral hygiene prevents tooth decay and gum disease.  The frequency of eye exams is based on your age, health, family medical history, use of contact lenses, and other factors. Follow your health care provider's recommendations for frequency of eye exams.  Eat a healthy diet. Foods such as vegetables, fruits, whole grains, low-fat dairy products, and lean protein foods contain the nutrients you need without too many calories. Decrease your intake of foods high in  solid fats, added sugars, and salt. Eat the right amount of calories for you. Get information about a proper diet from your health care provider, if necessary.  Regular physical exercise is one of the most important things you can do for your health. Most adults should get at least 150 minutes of moderate-intensity exercise (any activity that increases your heart rate and causes you to sweat) each week. In addition, most adults need muscle-strengthening exercises on 2 or more days a week.  Maintain a healthy weight. The body mass index (BMI) is a screening tool to identify possible weight problems. It provides an estimate of body fat based on height and weight. Your health care provider can find your BMI and can help you achieve or maintain a healthy weight. For adults 20 years and older:  A BMI below 18.5 is considered underweight.  A BMI of 18.5 to 24.9 is normal.  A BMI of 25 to 29.9 is considered overweight.  A BMI of 30 and above is considered obese.  Maintain normal blood lipids and cholesterol levels by exercising and minimizing your intake of saturated fat. Eat a balanced diet with plenty of fruit and vegetables. Blood tests for lipids and cholesterol should begin at age 53 and be repeated every 5 years.  If your lipid or cholesterol levels are high, you are over 50, or you are at high risk for heart disease, you may need your cholesterol levels checked more frequently. Ongoing high lipid and cholesterol levels should be treated with medicines if diet and exercise are not working.  If you smoke, find out from your health care provider how to quit. If you do not use tobacco, do not start.  Lung cancer screening is recommended for adults aged 65-80 years who are at high risk for developing lung cancer because of a history of smoking. A yearly low-dose CT scan of the lungs is recommended for people who have at least a 30-pack-year history of smoking and are a current smoker or have quit within  the past 15 years. A pack year of smoking is smoking an average of 1 pack of cigarettes a day for 1 year (for example: 1 pack a day for 30 years or 2 packs a day for 15 years). Yearly screening should continue until the smoker has stopped smoking for at least 15 years. Yearly screening should be stopped for people who develop a health problem that would prevent them from having lung cancer treatment.  If you choose to drink alcohol, do not have more than 2 drinks per day. One drink is considered to be 12 ounces (355 mL) of beer, 5 ounces (148 mL) of wine, or 1.5 ounces (44 mL) of liquor.  Avoid use of street drugs. Do not share needles with anyone. Ask for help if you need support or instructions about stopping the use of drugs.  High blood pressure causes heart disease and increases the risk of stroke. Your blood pressure should be checked at least every 1-2 years. Ongoing high blood pressure should be treated with medicines, if weight loss and exercise are not effective.  If you are 74-76 years old, ask your health care provider if you should take aspirin to prevent heart disease.  Diabetes screening is done by taking a blood sample to check your blood glucose level after you have not eaten for a certain period of time (fasting). If you are not overweight and you do not have risk factors for diabetes, you should be screened once every 3 years starting at age 73. If you are overweight or obese and you are 20-53 years of age, you should be screened for diabetes every year as part of your cardiovascular risk assessment.  Colorectal cancer can be detected and often prevented. Most routine colorectal cancer screening begins at the age of 57 and continues through age 23. However, your health care provider may recommend screening at an earlier age if you have risk factors for colon cancer. On a yearly basis, your health care provider may provide home test kits to check for hidden blood in the stool. Use of a  small camera at the end of a tube to directly examine the colon (sigmoidoscopy or colonoscopy) can detect the earliest forms of colorectal cancer. Talk to your health care provider about this at age 33, when routine screening begins. Direct exam of the colon should be repeated every 5-10 years through age 36, unless early forms of precancerous polyps or small growths are found.  People who are at an increased risk for hepatitis B should be screened for this virus. You are considered at high risk for hepatitis B if:  You were born in a country where hepatitis B occurs often. Talk with your health care provider about which countries are considered  high risk.  Your parents were born in a high-risk country and you have not received a shot to protect against hepatitis B (hepatitis B vaccine).  You have HIV or AIDS.  You use needles to inject street drugs.  You live with, or have sex with, someone who has hepatitis B.  You are a man who has sex with other men (MSM).  You get hemodialysis treatment.  You take certain medicines for conditions such as cancer, organ transplantation, and autoimmune conditions.  Hepatitis C blood testing is recommended for all people born from [birth year] through 1965 and any individual with known risks for hepatitis C.  Practice safe sex. Use condoms and avoid high-risk sexual practices to reduce the spread of sexually transmitted infections (STIs). STIs include gonorrhea, chlamydia, syphilis, trichomonas, herpes, HPV, and human immunodeficiency virus (HIV). Herpes, HIV, and HPV are viral illnesses that have no cure. They can result in disability, cancer, and death.  If you are a man who has sex with other men, you should be screened at least once per year for:  HIV.  Urethral, rectal, and pharyngeal infection of gonorrhea, chlamydia, or both.  If you are at risk of being infected with HIV, it is recommended that you take a prescription medicine daily to prevent HIV  infection. This is called preexposure prophylaxis (PrEP). You are considered at risk if:  You are a man who has sex with other men (MSM) and have other risk factors.  You are a heterosexual man, are sexually active, and are at increased risk for HIV infection.  You take drugs by injection.  You are sexually active with a partner who has HIV.  Talk with your health care provider about whether you are at high risk of being infected with HIV. If you choose to begin PrEP, you should first be tested for HIV. You should then be tested every 3 months for as long as you are taking PrEP.  A one-time screening for abdominal aortic aneurysm (AAA) and surgical repair of large AAAs by ultrasound are recommended for men ages 27 to 58 years who are current or former smokers.  Healthy men should no longer receive prostate-specific antigen (PSA) blood tests as part of routine cancer screening. Talk with your health care provider about prostate cancer screening.  Testicular cancer screening is not recommended for adult males who have no symptoms. Screening includes self-exam, a health care provider exam, and other screening tests. Consult with your health care provider about any symptoms you have or any concerns you have about testicular cancer.  Use sunscreen. Apply sunscreen liberally and repeatedly throughout the day. You should seek shade when your shadow is shorter than you. Protect yourself by wearing long sleeves, pants, a wide-brimmed hat, and sunglasses year round, whenever you are outdoors.  Once a month, do a whole-body skin exam, using a mirror to look at the skin on your back. Tell your health care provider about new moles, moles that have irregular borders, moles that are larger than a pencil eraser, or moles that have changed in shape or color.  Stay current with required vaccines (immunizations).  Influenza vaccine. All adults should be immunized every year.  Tetanus, diphtheria, and  acellular pertussis (Td, Tdap) vaccine. An adult who has not previously received Tdap or who does not know his vaccine status should receive 1 dose of Tdap. This initial dose should be followed by tetanus and diphtheria toxoids (Td) booster doses every 10 years. Adults with an unknown or incomplete  history of completing a 3-dose immunization series with Td-containing vaccines should begin or complete a primary immunization series including a Tdap dose. Adults should receive a Td booster every 10 years.  Varicella vaccine. An adult without evidence of immunity to varicella should receive 2 doses or a second dose if he has previously received 1 dose.  Human papillomavirus (HPV) vaccine. Males aged 11-21 years who have not received the vaccine previously should receive the 3-dose series. Males aged 22-26 years may be immunized. Immunization is recommended through the age of 30 years for any male who has sex with males and did not get any or all doses earlier. Immunization is recommended for any person with an immunocompromised condition through the age of 54 years if he did not get any or all doses earlier. During the 3-dose series, the second dose should be obtained 4-8 weeks after the first dose. The third dose should be obtained 24 weeks after the first dose and 16 weeks after the second dose.  Zoster vaccine. One dose is recommended for adults aged 6 years or older unless certain conditions are present.  Measles, mumps, and rubella (MMR) vaccine. Adults born before [birth year] generally are considered immune to measles and mumps. Adults born in [birth year] or later should have 1 or more doses of MMR vaccine unless there is a contraindication to the vaccine or there is laboratory evidence of immunity to each of the three diseases. A routine second dose of MMR vaccine should be obtained at least 28 days after the first dose for students attending postsecondary schools, health care workers, or international travelers.  People who received inactivated measles vaccine or an unknown type of measles vaccine during 1963-1967 should receive 2 doses of MMR vaccine. People who received inactivated mumps vaccine or an unknown type of mumps vaccine before 1979 and are at high risk for mumps infection should consider immunization with 2 doses of MMR vaccine. Unvaccinated health care workers born before 55 who lack laboratory evidence of measles, mumps, or rubella immunity or laboratory confirmation of disease should consider measles and mumps immunization with 2 doses of MMR vaccine or rubella immunization with 1 dose of MMR vaccine.  Pneumococcal 13-valent conjugate (PCV13) vaccine. When indicated, a person who is uncertain of his immunization history and has no record of immunization should receive the PCV13 vaccine. All adults 97 years of age and older should receive this vaccine. An adult aged 47 years or older who has certain medical conditions and has not been previously immunized should receive 1 dose of PCV13 vaccine. This PCV13 should be followed with a dose of pneumococcal polysaccharide (PPSV23) vaccine. Adults who are at high risk for pneumococcal disease should obtain the PPSV23 vaccine at least 8 weeks after the dose of PCV13 vaccine. Adults older than 81 years of age who have normal immune system function should obtain the PPSV23 vaccine dose at least 1 year after the dose of PCV13 vaccine.  Pneumococcal polysaccharide (PPSV23) vaccine. When PCV13 is also indicated, PCV13 should be obtained first. All adults aged 70 years and older should be immunized. An adult younger than age 48 years who has certain medical conditions should be immunized. Any person who resides in a nursing home or long-term care facility should be immunized. An adult smoker should be immunized. People with an immunocompromised condition and certain other conditions should receive both PCV13 and PPSV23 vaccines. People with human immunodeficiency  virus (HIV) infection should be immunized as soon as possible after diagnosis. Immunization during  chemotherapy or radiation therapy should be avoided. Routine use of PPSV23 vaccine is not recommended for American Indians, [place] Natives, or people younger than 65 years unless there are medical conditions that require PPSV23 vaccine. When indicated, people who have unknown immunization and have no record of immunization should receive PPSV23 vaccine. One-time revaccination 5 years after the first dose of PPSV23 is recommended for people aged 19-64 years who have chronic kidney failure, nephrotic syndrome, asplenia, or immunocompromised conditions. People who received 1-2 doses of PPSV23 before age 73 years should receive another dose of PPSV23 vaccine at age 31 years or later if at least 5 years have passed since the previous dose. Doses of PPSV23 are not needed for people immunized with PPSV23 at or after age 94 years.  Meningococcal vaccine. Adults with asplenia or persistent complement component deficiencies should receive 2 doses of quadrivalent meningococcal conjugate (MenACWY-D) vaccine. The doses should be obtained at least 2 months apart. Microbiologists working with certain meningococcal bacteria, [place] recruits, people at risk during an outbreak, and people who travel to or live in countries with a high rate of meningitis should be immunized. A first-year college student up through age 68 years who is living in a residence hall should receive a dose if he did not receive a dose on or after his 16th birthday. Adults who have certain high-risk conditions should receive one or more doses of vaccine.  Hepatitis A vaccine. Adults who wish to be protected from this disease, have chronic liver disease, work with hepatitis A-infected animals, work in hepatitis A research labs, or travel to or work in countries with a high rate of hepatitis A should be immunized. Adults who were previously unvaccinated and  who anticipate close contact with an international adoptee during the first 60 days after arrival in the [place] from a country with a high rate of hepatitis A should be immunized.  Hepatitis B vaccine. Adults should be immunized if they wish to be protected from this disease, are under age 85 years and have diabetes, have chronic liver disease, have had more than one sex partner in the past 6 months, may be exposed to blood or other infectious body fluids, are household contacts or sex partners of hepatitis B positive people, are clients or workers in certain care facilities, or travel to or work in countries with a high rate of hepatitis B.  Haemophilus influenzae type b (Hib) vaccine. A previously unvaccinated person with asplenia or sickle cell disease or having a scheduled splenectomy should receive 1 dose of Hib vaccine. Regardless of previous immunization, a recipient of a hematopoietic stem cell transplant should receive a 3-dose series 6-12 months after his successful transplant. Hib vaccine is not recommended for adults with HIV infection. Preventive Service / Frequency Ages 47 to 65  Blood pressure check.** / Every 3-5 years.  Lipid and cholesterol check.** / Every 5 years beginning at age 35.  Hepatitis C blood test.** / For any individual with known risks for hepatitis C.  Skin self-exam. / Monthly.  Influenza vaccine. / Every year.  Tetanus, diphtheria, and acellular pertussis (Tdap, Td) vaccine.** / Consult your health care provider. 1 dose of Td every 10 years.  Varicella vaccine.** / Consult your health care provider.  HPV vaccine. / 3 doses over 6 months, if 4 or younger.  Measles, mumps, rubella (MMR) vaccine.** / You need at least 1 dose of MMR if you were born in 1957 or later. You may also need a second  Consult your health care provider.  Pneumococcal polysaccharide (PPSV23) vaccine.** / 1 to 2 doses if you smoke  cigarettes or if you have certain conditions.  Meningococcal vaccine.** / 1 dose if you are age 19 to 21 years and a first-year college student living in a residence hall, or have one of several medical conditions. You may also need additional booster doses.  Hepatitis A vaccine.** / Consult your health care provider.  Hepatitis B vaccine.** / Consult your health care provider.  Haemophilus influenzae type b (Hib) vaccine.** / Consult your health care provider. Ages 40 to 64  Blood pressure check.** / Every year.  Lipid and cholesterol check.** / Every 5 years beginning at age 20.  Lung cancer screening. / Every year if you are aged 55-80 years and have a 30-pack-year history of smoking and currently smoke or have quit within the past 15 years. Yearly screening is stopped once you have quit smoking for at least 15 years or develop a health problem that would prevent you from having lung cancer treatment.  Fecal occult blood test (FOBT) of stool. / Every year beginning at age 50 and continuing until age 75. You may not have to do this test if you get a colonoscopy every 10 years.  Flexible sigmoidoscopy** or colonoscopy.** / Every 5 years for a flexible sigmoidoscopy or every 10 years for a colonoscopy beginning at age 50 and continuing until age 75.  Hepatitis C blood test.** / For all people born from 1945 through 1965 and any individual with known risks for hepatitis C.  Skin self-exam. / Monthly.  Influenza vaccine. / Every year.  Tetanus, diphtheria, and acellular pertussis (Tdap/Td) vaccine.** / Consult your health care provider. 1 dose of Td every 10 years.  Varicella vaccine.** / Consult your health care provider.  Zoster vaccine.** / 1 dose for adults aged 60 years or older.  Measles, mumps, rubella (MMR) vaccine.** / You need at least 1 dose of MMR if you were born in 1957 or later. You may also need a second dose.  Pneumococcal 13-valent conjugate (PCV13) vaccine.** /  Consult your health care provider.  Pneumococcal polysaccharide (PPSV23) vaccine.** / 1 to 2 doses if you smoke cigarettes or if you have certain conditions.  Meningococcal vaccine.** / Consult your health care provider.  Hepatitis A vaccine.** / Consult your health care provider.  Hepatitis B vaccine.** / Consult your health care provider.  Haemophilus influenzae type b (Hib) vaccine.** / Consult your health care provider. Ages 65 and over  Blood pressure check.** / Every year.  Lipid and cholesterol check.**/ Every 5 years beginning at age 20.  Lung cancer screening. / Every year if you are aged 55-80 years and have a 30-pack-year history of smoking and currently smoke or have quit within the past 15 years. Yearly screening is stopped once you have quit smoking for at least 15 years or develop a health problem that would prevent you from having lung cancer treatment.  Fecal occult blood test (FOBT) of stool. / Every year beginning at age 50 and continuing until age 75. You may not have to do this test if you get a colonoscopy every 10 years.  Flexible sigmoidoscopy** or colonoscopy.** / Every 5 years for a flexible sigmoidoscopy or every 10 years for a colonoscopy beginning at age 50 and continuing until age 75.  Hepatitis C blood test.** / For all people born from 1945 through 1965 and any individual with known risks for hepatitis C.  Abdominal aortic   known risks for hepatitis C.  Abdominal aortic aneurysm (AAA) screening.** / A one-time screening for ages 62 to 24 years who are current or former smokers.  Skin self-exam. / Monthly.  Influenza vaccine. / Every year.  Tetanus, diphtheria, and acellular pertussis (Tdap/Td) vaccine.** / 1 dose of Td every 10 years.  Varicella vaccine.** / Consult your health care provider.  Zoster vaccine.** / 1 dose for adults aged 76 years or older.  Pneumococcal 13-valent conjugate (PCV13) vaccine.** / 1 dose for all adults aged 19 years and older.  Pneumococcal  polysaccharide (PPSV23) vaccine.** / 1 dose for all adults aged 16 years and older.  Meningococcal vaccine.** / Consult your health care provider.  Hepatitis A vaccine.** / Consult your health care provider.  Hepatitis B vaccine.** / Consult your health care provider.  Haemophilus influenzae type b (Hib) vaccine.** / Consult your health care provider. **Family history and personal history of risk and conditions may change your health care provider's recommendations.   This information is not intended to replace advice given to you by your health care provider. Make sure you discuss any questions you have with your health care provider.   Document Released: 11/08/2001 Document Revised: 10/03/2014 Document Reviewed: 02/07/2011 Elsevier Interactive Patient Education [employer].

## 2019-10-29 ENCOUNTER — Other Ambulatory Visit: Payer: Self-pay | Admitting: Family Medicine

## 2019-10-30 ENCOUNTER — Other Ambulatory Visit: Payer: Self-pay | Admitting: Family Medicine

## 2019-10-30 ENCOUNTER — Telehealth: Payer: Self-pay | Admitting: Family Medicine

## 2019-10-30 DIAGNOSIS — K649 Unspecified hemorrhoids: Secondary | ICD-10-CM

## 2019-10-30 MED ORDER — HYDROCORTISONE ACETATE 25 MG RE SUPP: 25.0000 mg | Freq: Two times a day (BID) | RECTAL | 1 refills | Status: DC

## 2019-10-30 NOTE — Telephone Encounter (Signed)
Patient is aware refill has been sent in. AS, CMA

## 2019-10-30 NOTE — Telephone Encounter (Signed)
Hydrocortisone rectal suppositories for his hemorrhoids-refill provided and sent into pharmacy

## 2019-10-30 NOTE — Telephone Encounter (Signed)
Please let patient know I refilled the medicines and they were sent into his pharmacy.

## 2019-10-30 NOTE — Telephone Encounter (Signed)
Patient is requesting a refill of the hydrocortisone suppository, if approved [facility] to [facility] [address]

## 2019-10-30 NOTE — Telephone Encounter (Signed)
This medication has not been prescribed since 07/2017. Please advise if ok to refill med. AS, CMA

## 2019-11-21 ENCOUNTER — Ambulatory Visit: Payer: PPO | Admitting: Internal Medicine

## 2019-11-26 ENCOUNTER — Ambulatory Visit: Payer: PPO | Admitting: Internal Medicine

## 2019-11-26 ENCOUNTER — Other Ambulatory Visit: Payer: Self-pay

## 2019-11-26 ENCOUNTER — Encounter: Payer: Self-pay | Admitting: Internal Medicine

## 2019-11-26 VITALS — BP 148/70 | HR 70 | Ht 71.0 in | Wt 192.0 lb

## 2019-11-26 DIAGNOSIS — E041 Nontoxic single thyroid nodule: Secondary | ICD-10-CM | POA: Diagnosis not present

## 2019-11-26 NOTE — Patient Instructions (Signed)
We will schedule a thyroid U/S.  Please come back for a follow-up appointment in 1 year.   Thyroid Nodule  A thyroid nodule is an isolated growth of thyroid cells that forms a lump in your thyroid gland. The thyroid gland is a butterfly-shaped gland. It is found in the lower front of your neck. This gland sends chemical messengers (hormones) through your blood to all parts of your body. These hormones are important in regulating your body temperature and helping your body to use energy. Thyroid nodules are common. Most are not cancerous (benign). You may have one nodule or several nodules. Different types of thyroid nodules include nodules that:  Grow and fill with fluid (thyroid cysts).  Produce too much thyroid hormone (hot nodules or hyperthyroid).  Produce no thyroid hormone (cold nodules or hypothyroid).  Form from cancer cells (thyroid cancers). What are the causes? In most cases, the cause of this condition is not known. What increases the risk? The following factors may make you more likely to develop this condition.  Age. Thyroid nodules become more common in people who are older than 81 years of age.  Gender. ? Benign thyroid nodules are more common in women. ? Cancerous (malignant) thyroid nodules are more common in men.  A family history that includes: ? Thyroid nodules. ? Pheochromocytoma. ? Thyroid carcinoma. ? Hyperparathyroidism.  Certain kinds of thyroid diseases, such as Hashimoto's thyroiditis.  Lack of iodine in your diet.  A history of head and neck radiation, such as from previous cancer treatment. What are the signs or symptoms? In many cases, there are no symptoms. If you have symptoms, they may include:  A lump in your lower neck.  Feeling a lump or tickle in your throat.  Pain in your neck, jaw, or ear.  Having trouble swallowing. Hot nodules may cause symptoms that include:  Weight loss.  Warm, flushed skin.  Feeling hot.  Feeling  nervous.  A racing heartbeat. Cold nodules may cause symptoms that include:  Weight gain.  Dry skin.  Brittle hair. This may also occur with hair loss.  Feeling cold.  Fatigue. Thyroid cancer nodules may cause symptoms that include:  Hard nodules that feel stuck to the thyroid gland.  Hoarseness.  Lumps in the glands near your thyroid (lymph nodes). How is this diagnosed? A thyroid nodule may be felt by your health care provider during a physical exam. This condition may also be diagnosed based on your symptoms. You may also have tests, including:  An ultrasound. This may be done to confirm the diagnosis.  A biopsy. This involves taking a sample from the nodule and looking at it under a microscope.  Blood tests to make sure that your thyroid is working properly.  A thyroid scan. This test uses a radioactive tracer injected into a vein to create an image of the thyroid gland on a computer screen.  Imaging tests such as MRI or CT scan. These may be done if: ? Your nodule is large. ? Your nodule is blocking your airway. ? Cancer is suspected. How is this treated? Treatment depends on the cause and size of your nodule or nodules. If the nodule is benign, treatment may not be necessary. Your health care provider may monitor the nodule to see if it goes away without treatment. If the nodule continues to grow, is cancerous, or does not go away, treatment may be needed. Treatment may include:  Having a cystic nodule drained with a needle.  Ablation therapy.  In this treatment, alcohol is injected into the area of the nodule to destroy the cells. Ablation with heat (thermal ablation) may also be used.  Radioactive iodine. In this treatment, radioactive iodine is given as a pill or liquid that you drink. This substance causes the thyroid nodule to shrink.  Surgery to remove the nodule. Part or all of your thyroid gland may need to be removed as well.  Medicines. Follow these  instructions at home:  Pay attention to any changes in your nodule.  Take over-the-counter and prescription medicines only as told by your health care provider.  Keep all follow-up visits as told by your health care provider. This is important. Contact a health care provider if:  Your voice changes.  You have trouble swallowing.  You have pain in your neck, ear, or jaw that is getting worse.  Your nodule gets bigger.  Your nodule starts to make it harder for you to breathe.  Your muscles look like they are shrinking (muscle wasting). Get help right away if:  You have chest pain.  There is a loss of consciousness.  You have a sudden fever.  You feel confused.  You are seeing or hearing things that other people do not see or hear (having hallucinations).  You feel very weak.  You have mood swings.  You feel very restless.  You feel suddenly nauseous or throw up.  You suddenly have diarrhea. Summary  A thyroid nodule is an isolated growth of thyroid cells that forms a lump in your thyroid gland.  Thyroid nodules are common. Most are not cancerous (benign). You may have one nodule or several nodules.  Treatment depends on the cause and size of your nodule or nodules. If the nodule is benign, treatment may not be necessary.  Your health care provider may monitor the nodule to see if it goes away without treatment. If the nodule continues to grow, is cancerous, or does not go away, treatment may be needed. This information is not intended to replace advice given to you by your health care provider. Make sure you discuss any questions you have with your health care provider. Document Revised: 04/27/2018 Document Reviewed: 04/30/2018 Elsevier Patient Education  Larry Harris.

## 2019-11-26 NOTE — Progress Notes (Signed)
Patient ID: [name], male   DOB: 07-10-1941, 79 y.o.   MRN: [id number]   This visit occurred during the SARS-CoV-2 public health emergency.  Safety protocols were in place, including screening questions prior to the visit, additional usage of staff PPE, and extensive cleaning of exam room while observing appropriate contact time as indicated for disinfecting solutions.   HPI  Larry Harris is a 79 y.o.-year-old male, referred by his PCP, Dr. Raliegh Scarlet, for evaluation for a recently found thyroid nodule. He moved to [place] recently to be closer to his children.  Patient was found to have a right thyroid nodule recent CT of his cervical spine performed for neck pain, L arm numbness, HTN.  CT neck (09/04/2019): 1.6 cm hypodense right thyroid nodule    Pt denies: - feeling nodules in neck - hoarseness - dysphagia - choking - SOB with lying down  I reviewed pt's thyroid tests: Lab Results  Component Value Date   TSH 2.060 08/26/2019   TSH 2.440 08/16/2018   TSH 2.790 08/09/2017   TSH 1.84 07/25/2016   FREET4 1.09 08/26/2019   FREET4 1.15 08/16/2018    Pt denies: - fatigue - heat intolerance/cold intolerance - tremors - palpitations - anxiety/depression - hyperdefecation/constipation - dry skin - hair loss  He has: - + weight gain (Covid pandemic)  No FH of thyroid ds. No FH of thyroid cancer. No h/o radiation tx to head or neck.  No seaweed or kelp. No recent contrast studies (last 08/2019). No herbal supplements. On Biotin. No supplements or Hair, Skin and Nails vitamins.  He also has a h/o HTN. Pt also has a h/o sciatica - last steroid inj 5 years ago.  He walks for exercise, 1/2 mi per day.  Father with colon cancer.  His wife died from [place].  ROS: Constitutional: + See HPI, + nocturia Eyes: no blurry vision, no xerophthalmia ENT: no sore throat,  + see HPI Cardiovascular: no CP/SOB/palpitations/leg swelling Respiratory: no  cough/SOB Gastrointestinal: no N/V/D/C Musculoskeletal: no muscle/joint aches Skin: no rashes Neurological: no tremors/numbness/tingling/dizziness Psychiatric: no depression/anxiety  Past Medical History:  Diagnosis Date  . Acute rhinitis   . Allergic rhinitis, cause unspecified   . Allergy   . BPH (benign prostatic hyperplasia)   . CAD (coronary artery disease)   . Cancer ([place])    scalp basal cell skin ca  . Cataract    bilateral cateracts removed  . Clotting disorder ([place])   . Hypercholesteremia   . Hypotension   . Neuralgia, neuritis, and radiculitis, unspecified   . Osteoarthritis   . Spinal stenosis of lumbar region 06/04/2014  . Thrombocytopenia, unspecified ([place])    Past Surgical History:  Procedure Laterality Date  . APPENDECTOMY  1958  . bilateral cateracts removed    . COLONOSCOPY    . [place]   x2   Social History   Socioeconomic History  . Marital status: Married    Spouse name: Not on file  . Number of children: 2  . Years of education: college 4  . Highest education level: Not on file  Occupational History  . Occupation: Retired  Tobacco Use  . Smoking status: Former Smoker    Packs/day: 1.00    Years: 25.00    Pack years: 25.00    Types: Cigarettes    Quit date: 07/27/1994    Years since quitting: 25.3  . Smokeless tobacco: Never Used  Substance and Sexual Activity  . Alcohol use: Yes  Alcohol/week: 1.0 standard drinks    Types: 1 Glasses of wine per week    Comment: occasional  . Drug use: No  . Sexual activity: Not on file  Other Topics Concern  . Not on file  Social History Narrative   The patient is widowed. 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 He has no history of alcohol use. He has previously done regular exercise until developing a back problem.      Patient drinks about 3-4 cups of coffee daily.   Patient is right handed.    Social Determinants of Health   Financial Resource Strain:   . Difficulty  of Paying Living Expenses: Not on file  Food Insecurity:   . Worried About Charity fundraiser in the Last Year: Not on file  . Ran Out of Food in the Last Year: Not on file  Transportation Needs:   . Lack of Transportation (Medical): Not on file  . Lack of Transportation (Non-Medical): Not on file  Physical Activity:   . Days of Exercise per Week: Not on file  . Minutes of Exercise per Session: Not on file  Stress:   . Feeling of Stress : Not on file  Social Connections:   . Frequency of Communication with Friends and Family: Not on file  . Frequency of Social Gatherings with Friends and Family: Not on file  . Attends Religious Services: Not on file  . Active Member of Clubs or Organizations: Not on file  . Attends Archivist Meetings: Not on file  . Marital Status: Not on file  Intimate Partner Violence:   . Fear of Current or Ex-Partner: Not on file  . Emotionally Abused: Not on file  . Physically Abused: Not on file  . Sexually Abused: Not on file   Current Outpatient Medications on File Prior to Visit  Medication Sig Dispense Refill  . aspirin 325 MG tablet Take 325 mg by mouth daily.      Marland Kitchen b complex vitamins tablet Take 1 tablet by mouth daily.    . calcium carbonate (OS-CAL) 600 MG TABS Take 600 mg by mouth daily.      . diphenhydrAMINE (BENADRYL) 25 mg capsule Take 25 mg by mouth as needed (insomnia).    Marland Kitchen doxazosin (CARDURA) 2 MG tablet TAKE 1 TABLET BY MOUTH EVERYDAY AT BEDTIME 90 tablet 0  . fluticasone (FLONASE) 50 MCG/ACT nasal spray SPRAY 2 SPRAYS INTO EACH NOSTRIL EVERY DAY 16 mL 1  . gabapentin (NEURONTIN) 300 MG capsule TAKE 1 CAPSULE BY MOUTH EVERY MORNING AND 2 CAPSULES BY MOUTH BEFORE BEDTIME 270 capsule 0  . hydrocortisone (ANUSOL-HC) 25 MG suppository Place 1 suppository (25 mg total) rectally 2 (two) times daily. 12 suppository 1  . Multiple Vitamin (MULTIVITAMIN) capsule Take 1 capsule by mouth daily.      . vitamin B-12 (CYANOCOBALAMIN) 1000 MCG  tablet Take 1,000 mcg by mouth daily.      Marland Kitchen VITAMIN D, CHOLECALCIFEROL, PO Take 1,000 Units by mouth daily.      No current facility-administered medications on file prior to visit.   No Known Allergies Family History  Problem Relation Age of Onset  . Stroke Mother 46  . Prostate cancer Father 52  . Heart failure Brother 30  . Lung cancer Sister   . Colon cancer Neg Hx   . Esophageal cancer Neg Hx   . Rectal cancer Neg Hx   . Stomach cancer Neg Hx  PE: BP (!) 148/70   Pulse 70   Ht _0  (1.803 m)   Wt 192 lb (87.1 kg)   SpO2 99%   BMI 26.78 kg/m  Wt Readings from Last 3 Encounters:  11/26/19 192 lb (87.1 kg)  10/11/19 196 lb 6.4 oz (89.1 kg)  07/04/19 189 lb 6.4 oz (85.9 kg)   Constitutional: overweight, in NAD Eyes: PERRLA, EOMI, no exophthalmos ENT: moist mucous membranes, no thyromegaly, no thyroid nodule palpated cervical lymphadenopathy Cardiovascular: RRR, No MRG Respiratory: CTA B Gastrointestinal: abdomen soft, NT, ND, BS+ Musculoskeletal: no deformities, strength intact in all 4;  Skin: moist, warm, no rashes Neurological: no tremor with outstretched hands, DTR normal in all 4  ASSESSMENT: 1. Thyroid nodule  PLAN: 1. Thyroid nodule - I reviewed the report and images of his neck CT along with the patient. I pointed out that the nodule is not very large, at 1.6 cm, however, a CT scan is not the best way to evaluate the structure of thyroid nodules.  I suggested a thyroid ultrasound which can give [place] information about the nodules echogenicity, the presence of calcifications, internal blood flow, spatial orientation, and margins.  I explained that a nodule that is hypoechoic, has microcalcifications, internal blood flow, a more tall than wide orientation and invaginations in the surrounding tissue is more likely to be cancerous.  I will be in touch with him after the results of his thyroid ultrasound are back and discuss about his risk of cancer and whether he  needs a biopsy or not. - Pt does not have a thyroid cancer family history or a personal history of RxTx to head/neck. All these would favor benignity.  - Ultimately, I explained that the only way that we can tell exactly if it is cancer or not is by doing a thyroid biopsy (FNA). I explained what the test entails. - patient agrees with an FNA if needed - I explained that this is not cancer, we can continue to follow him on a yearly basis, and check another ultrasound in another year or 2. - if he develops neck compression symptoms, in that case, we might need to do either lobectomy or thyroidectomy - I'll see him back in a year, assuming her FNA is normal. If FNA abnormal, we will meet sooner.  - no need to repeat his TFTs since they were recently normal - I advised pt to join my chart and I will send him the results through there   Orders Placed This Encounter  Procedures  . US THYROID   Thyroid U/S (12/02/2019): Narrative & Impression    CLINICAL DATA:  Thyroid nodule seen on recent CT.  EXAM: THYROID ULTRASOUND  TECHNIQUE: Ultrasound examination of the thyroid gland and adjacent soft tissues was performed.  COMPARISON:  None.  FINDINGS: Parenchymal Echotexture: Moderately heterogenous Isthmus: 0.4 cm Right lobe: 4.5 x 2.1 x 1.5 cm Left lobe: 4.5 x 2.4 x 1.8 cm _________________________________________________________  Nodule # 1: Location: Right; Mid Maximum size: 1 cm; Other 2 dimensions: 0.7 x 0.6 cm Composition: cannot determine (2) Echogenicity: cannot determine (1) Shape: not taller-than-wide (0) Margins: cannot determine (0) Echogenic foci: peripheral calcifications (2) ACR TI-RADS total points: 5. ACR TI-RADS risk category: TR4 (4-6 points). ACR TI-RADS recommendations:  Given size (<0.9 cm) and appearance, this nodule does NOT meet TI-RADS criteria for biopsy or dedicated follow-up.  _________________________________________________________  Nodule  # 2: Location: Left; Mid Maximum size: 1.1 cm; Other 2 dimensions: 0.7 x 0.6 cm  Composition: cystic/almost completely cystic (0) Echogenicity: anechoic (0) Shape: not taller-than-wide (0) Margins: smooth (0) Echogenic foci: none (0) ACR TI-RADS total points: 0. ACR TI-RADS risk category: TR1 (0-1 points). ACR TI-RADS recommendations:  This nodule does NOT meet TI-RADS criteria for biopsy or dedicated follow-up. _________________________________________________________  There are no pathologically enlarged lymph nodes.  IMPRESSION: 1. Moderately heterogeneous, normal sized thyroid gland as detailed above. 2. There are bilateral thyroid nodules as detailed above, neither of which meet criteria for follow-up or FNA.  The above is in keeping with the ACR TI-RADS recommendations - J Am Coll Radiol 2017;14:587-595.   Electronically Signed   By: Constance Holster M.D.   On: 12/02/2019 23:01   Good news: Small thyroid nodules, not needing further follow-up.  Philemon Kingdom, MD PhD [facility] Endocrinology

## 2019-12-02 ENCOUNTER — Ambulatory Visit
Admission: RE | Admit: 2019-12-02 | Discharge: 2019-12-02 | Disposition: A | Discharge status: Home / Self Care | Payer: PPO | Source: Ambulatory Visit | Attending: Internal Medicine | Admitting: Internal Medicine

## 2019-12-02 DIAGNOSIS — E041 Nontoxic single thyroid nodule: Secondary | ICD-10-CM

## 2019-12-04 ENCOUNTER — Other Ambulatory Visit: Payer: Self-pay | Admitting: Family Medicine

## 2019-12-04 ENCOUNTER — Telehealth: Payer: Self-pay

## 2019-12-04 DIAGNOSIS — N400 Enlarged prostate without lower urinary tract symptoms: Secondary | ICD-10-CM

## 2019-12-04 NOTE — Telephone Encounter (Signed)
Left message for patient to return our call at [phone].  

## 2019-12-04 NOTE — Telephone Encounter (Signed)
-----   Message from Philemon Kingdom, MD sent at 12/03/2019  5:09 PM EST ----- Lenna Sciara, can you please call pt:  Good news: Thyroid ultrasound from 12/02/2019 is back and this showed 2 small nodules (1 nodule of 1 cm and 1 nodule of 1.1 cm in the largest dimensions), not worrisome.  None of these 2 nodules need intervention or follow-up.  Therefore, we can cancel his appointment in 1 year.

## 2019-12-05 NOTE — Telephone Encounter (Signed)
Notified patient of message from Dr. Gherghe, patient expressed understanding and agreement. No further questions.  

## 2019-12-09 ENCOUNTER — Telehealth: Payer: Self-pay | Admitting: Family Medicine

## 2019-12-09 ENCOUNTER — Other Ambulatory Visit: Payer: Self-pay | Admitting: Family Medicine

## 2019-12-09 DIAGNOSIS — N400 Enlarged prostate without lower urinary tract symptoms: Secondary | ICD-10-CM

## 2019-12-09 MED ORDER — DOXAZOSIN MESYLATE 2 MG PO TABS: ORAL_TABLET | ORAL | 0 refills | Status: DC

## 2019-12-09 NOTE — Telephone Encounter (Signed)
Patient request refill to be a 90day / 3 month supply--glh

## 2019-12-09 NOTE — Telephone Encounter (Signed)
Med refill sent to pharmacy. AS, CMA 

## 2019-12-09 NOTE — Telephone Encounter (Signed)
Patient request refill on :   doxazosin (CARDURA) 2 MG tablet [id number]   Order Details Dose, Route, Frequency: As Directed  Dispense Quantity: 90 tablet Refills: 0       Sig: TAKE 1 TABLET BY MOUTH EVERYDAY AT BEDTIME       --Forwarding request to med asst to send refill order to:   [id number] [address], [place]. [phone] (Phone) [phone] (Fax)   --Dion Body

## 2019-12-09 NOTE — Telephone Encounter (Signed)
Of the Cardura?  He was given 90 tabs which is 3 month supply.

## 2019-12-13 DIAGNOSIS — Z85828 Personal history of other malignant neoplasm of skin: Secondary | ICD-10-CM | POA: Diagnosis not present

## 2019-12-13 DIAGNOSIS — C4442 Squamous cell carcinoma of skin of scalp and neck: Secondary | ICD-10-CM | POA: Diagnosis not present

## 2019-12-13 DIAGNOSIS — L578 Other skin changes due to chronic exposure to nonionizing radiation: Secondary | ICD-10-CM | POA: Diagnosis not present

## 2019-12-13 DIAGNOSIS — Z872 Personal history of diseases of the skin and subcutaneous tissue: Secondary | ICD-10-CM | POA: Diagnosis not present

## 2019-12-13 DIAGNOSIS — L570 Actinic keratosis: Secondary | ICD-10-CM | POA: Diagnosis not present

## 2019-12-13 DIAGNOSIS — Z08 Encounter for follow-up examination after completed treatment for malignant neoplasm: Secondary | ICD-10-CM | POA: Diagnosis not present

## 2019-12-13 DIAGNOSIS — L821 Other seborrheic keratosis: Secondary | ICD-10-CM | POA: Diagnosis not present

## 2019-12-13 DIAGNOSIS — D225 Melanocytic nevi of trunk: Secondary | ICD-10-CM | POA: Diagnosis not present

## 2020-01-07 ENCOUNTER — Other Ambulatory Visit: Payer: Self-pay | Admitting: Family Medicine

## 2020-02-02 ENCOUNTER — Other Ambulatory Visit: Payer: Self-pay | Admitting: Family Medicine

## 2020-02-10 ENCOUNTER — Other Ambulatory Visit: Payer: Self-pay | Admitting: Physician Assistant

## 2020-02-26 ENCOUNTER — Telehealth: Payer: Self-pay | Admitting: Physician Assistant

## 2020-02-26 ENCOUNTER — Other Ambulatory Visit: Payer: Self-pay

## 2020-02-26 ENCOUNTER — Ambulatory Visit
Admission: EM | Admit: 2020-02-26 | Discharge: 2020-02-26 | Disposition: A | Discharge status: Home / Self Care | Payer: PPO | Attending: Physician Assistant | Admitting: Physician Assistant

## 2020-02-26 DIAGNOSIS — R531 Weakness: Secondary | ICD-10-CM

## 2020-02-26 DIAGNOSIS — Z7689 Persons encountering health services in other specified circumstances: Secondary | ICD-10-CM | POA: Diagnosis not present

## 2020-02-26 MED ORDER — DOXYCYCLINE HYCLATE 100 MG PO TABS: 100.0000 mg | ORAL_TABLET | Freq: Two times a day (BID) | ORAL | 0 refills | Status: DC

## 2020-02-26 NOTE — ED Triage Notes (Signed)
Pt states has had 3 tick bites in a week. States woke up at 2am this morning restless. States hasn't felt good all day. C/o fatigue, lethargic, dry mouth, and diarrhea x1.

## 2020-02-26 NOTE — Telephone Encounter (Signed)
Patient calling stating he has pulled 3 ticks off his skin (all 3 were stuck) and that he is now having flu like symptoms. Patient requesting advise.   Advised patient he would need to be seen and evaluated for Lymes Disease or tick fever. Patient verbalized understanding.   Patient referred to UC since no available apts in our office this week.   AS, CMA

## 2020-02-26 NOTE — Discharge Instructions (Signed)
See your Physician for recheck in 1 week  °

## 2020-02-26 NOTE — ED Provider Notes (Signed)
[facility]    CSN: [id number] Arrival date & time: 02/26/20  1524      History   Chief Complaint Chief Complaint  Patient presents with  . Fatigue    HPI Larry Harris is a 79 y.o. male.   Patient reports he removed multiple ticks last week.  Patient states yesterday and today he has felt weak and tired.  Patient states he did not get out of bed today.  He has been immunized for Covid.  Patient denies any cough he denies any fever or chills.  Patient denies any body aches  The history is provided by the patient. No language interpreter was used.  Weakness Severity:  Moderate Onset quality:  Gradual Duration:  2 days Timing:  Constant Progression:  Worsening Chronicity:  New Context: not alcohol use   Relieved by:  Nothing Worsened by:  Nothing Ineffective treatments:  None tried Associated symptoms: nausea   Risk factors: no anemia     Past Medical History:  Diagnosis Date  . Acute rhinitis   . Allergic rhinitis, cause unspecified   . Allergy   . BPH (benign prostatic hyperplasia)   . CAD (coronary artery disease)   . Cancer ([place])    scalp basal cell skin ca  . Cataract    bilateral cateracts removed  . Clotting disorder ([place])   . Hypercholesteremia   . Hypotension   . Neuralgia, neuritis, and radiculitis, unspecified   . Osteoarthritis   . Spinal stenosis of lumbar region 06/04/2014  . Thrombocytopenia, unspecified [facility])     Patient Active Problem List   Diagnosis Date Noted  . Adrenal nodule ([place])- new onset, seen on CT scan done 09-04-19 10/11/2019  . Thyroid nodule-   R-  new onset, seen on CT scan done 09-04-19 10/11/2019  . HLD (hyperlipidemia) 08/31/2019  . Hypertension 05/22/2019  . CAD (coronary artery disease)   . Cancer ([place])   . Thrombocytopenia, unspecified ([place])   . Pharyngitis 09/04/2018  . Acute maxillary sinusitis 10/23/2017  . Injury of right elbow 05/02/2017  . Low platelet count- chronically 08/01/2016  . BPH  (benign prostatic hyperplasia) 02/15/2016  . Overweight (BMI 25.0-29.9) 02/15/2016  . Spinal stenosis of lumbar region 06/04/2014    Past Surgical History:  Procedure Laterality Date  . APPENDECTOMY  1958  . bilateral cateracts removed    . COLONOSCOPY    . [place]   x2       Home Medications    Prior to Admission medications   Medication Sig Start Date End Date Taking? Authorizing Provider  aspirin 325 MG tablet Take 325 mg by mouth daily.      [provider]  b complex vitamins tablet Take 1 tablet by mouth daily.    [provider]  calcium carbonate (OS-CAL) 600 MG TABS Take 600 mg by mouth daily.      [provider]  diphenhydrAMINE (BENADRYL) 25 mg capsule Take 25 mg by mouth as needed (insomnia).    [provider]  doxazosin (CARDURA) 2 MG tablet TAKE 1 TABLET BY MOUTH DAILY AT BEDTIME 12/09/19   Opalski, Deborah, DO  doxycycline (VIBRA-TABS) 100 MG tablet Take 1 tablet (100 mg total) by mouth 2 (two) times daily. 02/26/20   Fransico Meadow, PA-C  fluticasone (FLONASE) 50 MCG/ACT nasal spray SPRAY 2 SPRAYS INTO EACH NOSTRIL EVERY DAY 02/03/20   Abonza, Maritza, PA-C  gabapentin (NEURONTIN) 300 MG capsule TAKE 1 CAPSULE BY MOUTH EVERY  MORNING AND 2 CAPSULES BY MOUTH BEFORE BEDTIME 01/07/20   Opalski, Neoma Laming, DO  hydrocortisone (ANUSOL-HC) 25 MG suppository Place 1 suppository (25 mg total) rectally 2 (two) times daily. 10/30/19   Mellody Dance, DO  Multiple Vitamin (MULTIVITAMIN) capsule Take 1 capsule by mouth daily.      [provider]  vitamin B-12 (CYANOCOBALAMIN) 1000 MCG tablet Take 1,000 mcg by mouth daily.      [provider]  VITAMIN D, CHOLECALCIFEROL, PO Take 1,000 Units by mouth daily.     [provider]    Family History Family History  Problem Relation Age of Onset  . Stroke Mother 71  . Prostate cancer Father 77  . Heart failure Brother 51  . Lung cancer Sister   . Colon cancer  Neg Hx   . Esophageal cancer Neg Hx   . Rectal cancer Neg Hx   . Stomach cancer Neg Hx     Social History Social History   Tobacco Use  . Smoking status: Former Smoker    Packs/day: 1.00    Years: 25.00    Pack years: 25.00    Types: Cigarettes    Quit date: 07/27/1994    Years since quitting: 25.6  . Smokeless tobacco: Never Used  Substance Use Topics  . Alcohol use: Yes    Alcohol/week: 1.0 standard drinks    Types: 1 Glasses of wine per week    Comment: occasional  . Drug use: No     Allergies   Patient has no known allergies.   Review of Systems Review of Systems  Gastrointestinal: Positive for nausea.  Neurological: Positive for weakness.  All other systems reviewed and are negative.    Physical Exam Triage Vital Signs ED Triage Vitals  Enc Vitals Group     BP 02/26/20 1533 99/63     Pulse Rate 02/26/20 1533 (!) 109     Resp 02/26/20 1533 18     Temp 02/26/20 1533 98 F (36.7 C)     Temp Source 02/26/20 1533 Oral     SpO2 02/26/20 1533 95 %     Weight --      Height --      Head Circumference --      Peak Flow --      Pain Score 02/26/20 1538 0     Pain Loc --      Pain Edu? --      Excl. in [place]? --    No data found.  Updated Vital Signs BP 99/63 (BP Location: Right Arm)   Pulse (!) 109   Temp 98 F (36.7 C) (Oral)   Resp 18   SpO2 95%   Visual Acuity Right Eye Distance:   Left Eye Distance:   Bilateral Distance:    Right Eye Near:   Left Eye Near:    Bilateral Near:     Physical Exam Vitals and nursing note reviewed.  Constitutional:      Appearance: He is well-developed.  HENT:     Head: Normocephalic.     Mouth/Throat:     Mouth: Mucous membranes are moist.  Eyes:     Pupils: Pupils are equal, round, and reactive to light.  Cardiovascular:     Rate and Rhythm: Normal rate.  Pulmonary:     Effort: Pulmonary effort is normal.  Abdominal:     General: There is no distension.  Musculoskeletal:        General: Normal  range of motion.  Cervical back: Normal range of motion.  Skin:    General: Skin is warm.  Neurological:     Mental Status: He is alert and oriented to person, place, and time.  Psychiatric:        Mood and Affect: Mood normal.      UC Treatments / Results  Labs (all labs ordered are listed, but only abnormal results are displayed) Labs Reviewed  NOVEL CORONAVIRUS, NAA  CBC WITH DIFFERENTIAL/PLATELET  COMPREHENSIVE METABOLIC PANEL  B. BURGDORFI ANTIBODIES  ROCKY MTN SPOTTED FVR ABS PNL(IGG+IGM)    EKG   Radiology No results found.  Procedures Procedures (including critical care time)  Medications Ordered in UC Medications - No data to display  Initial Impression / Assessment and Plan / UC Course  I have reviewed the triage vital signs and the nursing notes.  Pertinent labs & imaging results that were available during my care of the patient were reviewed by me and considered in my medical decision making (see chart for details).     MDM: I will cover patient with doxycycline due to tick bites.  Covid test is ordered.  Ordered [facility] spotted fever Lyme test CBC and c-Met she is advised to make an appointment with his primary care provider for recheck in 1 week.  Patient is encouraged to drink fluids and eat normally Final Clinical Impressions(s) / UC Diagnoses   Final diagnoses:  Weakness     Discharge Instructions     See your Physician for recheck in 1 week    ED Prescriptions    Medication Sig Dispense Auth. Provider   doxycycline (VIBRA-TABS) 100 MG tablet Take 1 tablet (100 mg total) by mouth 2 (two) times daily. 28 tablet Fransico Meadow, [place]     PDMP not reviewed this encounter.  An After Visit Summary was printed and given to the patient.    Fransico Meadow, [place] 02/26/20 1616

## 2020-02-27 LAB — NOVEL CORONAVIRUS, NAA: SARS-CoV-2, NAA: NOT DETECTED

## 2020-02-27 LAB — SARS-COV-2, NAA 2 DAY TAT

## 2020-02-28 LAB — COMPREHENSIVE METABOLIC PANEL
ALT: 14 IU/L (ref 0–44)
AST: 17 IU/L (ref 0–40)
Albumin/Globulin Ratio: 2.1 (ref 1.2–2.2)
Albumin: 3.6 g/dL — ABNORMAL LOW (ref 3.7–4.7)
Alkaline Phosphatase: 59 IU/L (ref 48–121)
BUN/Creatinine Ratio: 17 (ref 10–24)
BUN: 18 mg/dL (ref 8–27)
Bilirubin Total: 1 mg/dL (ref 0.0–1.2)
CO2: 24 mmol/L (ref 20–29)
Calcium: 8.3 mg/dL — ABNORMAL LOW (ref 8.6–10.2)
Chloride: 106 mmol/L (ref 96–106)
Creatinine, Ser: 1.08 mg/dL (ref 0.76–1.27)
GFR calc Af Amer: 75 mL/min/{1.73_m2} (ref >59)
GFR calc non Af Amer: 64 mL/min/{1.73_m2} (ref >59)
Globulin, Total: 1.7 g/dL (ref 1.5–4.5)
Glucose: 142 mg/dL — ABNORMAL HIGH (ref 65–99)
Potassium: 4.9 mmol/L (ref 3.5–5.2)
Sodium: 144 mmol/L (ref 134–144)
Total Protein: 5.3 g/dL — ABNORMAL LOW (ref 6.0–8.5)

## 2020-02-28 LAB — CBC WITH DIFFERENTIAL/PLATELET
Basophils Absolute: 0 10*3/uL (ref 0.0–0.2)
Basos: 0 %
EOS (ABSOLUTE): 0 10*3/uL (ref 0.0–0.4)
Eos: 0 %
Hematocrit: 48.8 % (ref 37.5–51.0)
Hemoglobin: 16.2 g/dL (ref 13.0–17.7)
Immature Grans (Abs): 0 10*3/uL (ref 0.0–0.1)
Immature Granulocytes: 0 %
Lymphocytes Absolute: 0.7 10*3/uL (ref 0.7–3.1)
Lymphs: 6 %
MCH: 31.6 pg (ref 26.6–33.0)
MCHC: 33.2 g/dL (ref 31.5–35.7)
MCV: 95 fL (ref 79–97)
Monocytes Absolute: 0.9 10*3/uL (ref 0.1–0.9)
Monocytes: 8 %
Neutrophils Absolute: 9.6 10*3/uL — ABNORMAL HIGH (ref 1.4–7.0)
Neutrophils: 86 %
Platelets: 166 10*3/uL (ref 150–450)
RBC: 5.13 x10E6/uL (ref 4.14–5.80)
RDW: 13.3 % (ref 11.6–15.4)
WBC: 11.3 10*3/uL — ABNORMAL HIGH (ref 3.4–10.8)

## 2020-02-28 LAB — B. BURGDORFI ANTIBODIES: Lyme IgG/IgM Ab: <0.91 {ISR} (ref 0.00–0.90)

## 2020-02-28 LAB — ROCKY MTN SPOTTED FVR ABS PNL(IGG+IGM)
RMSF IgG: NEGATIVE
RMSF IgM: 0.2 index (ref 0.00–0.89)

## 2020-03-03 NOTE — Progress Notes (Signed)
Established Patient Office Visit  Subjective:  Patient ID: Larry Harris, male    DOB: 09/03/1941  Age: 79 y.o. MRN: [id number]  CC:  Chief Complaint  Patient presents with  . Hypertension  . Hyperlipidemia    HPI SHADMAN TOZZI presents for 4 month follow-up on hypertension and hyperlipidemia. Pt recently seen at [facility] (02/26/20) for generalized weakness and fatigue after removing several ticks. He was started on doxycycline as prophylaxis and states feeling much better, no longer symptomatic.  HTN: Pt denies chest pain, palpitations, dizziness or lower leg swelling. Checks BP at home and readings range in 130-140s/60s-70s. Pt follows a low salt diet.  HLD: Pt is trying to control elevated cholesterol with diet. His spinal stenosis limits his activity level but tries to stay as active as possible with doing work outside.  BPH: Denies nocturia. Stable and requesting refill of Cardura.  Spinal stenosis: Tolerable and manageable with Gabapentin.    Past Medical History:  Diagnosis Date  . Acute rhinitis   . Allergic rhinitis, cause unspecified   . Allergy   . BPH (benign prostatic hyperplasia)   . CAD (coronary artery disease)   . Cancer ([place])    scalp basal cell skin ca  . Cataract    bilateral cateracts removed  . Clotting disorder ([place])   . Hypercholesteremia   . Hypotension   . Neuralgia, neuritis, and radiculitis, unspecified   . Osteoarthritis   . Spinal stenosis of lumbar region 06/04/2014  . Thrombocytopenia, unspecified ([place])     Past Surgical History:  Procedure Laterality Date  . APPENDECTOMY  1958  . bilateral cateracts removed    . COLONOSCOPY    . HERNIA REPAIR  1965   x2    Family History  Problem Relation Age of Onset  . Stroke Mother 60  . Prostate cancer Father 50  . Heart failure Brother 59  . Lung cancer Sister   . Colon cancer Neg Hx   . Esophageal cancer Neg Hx   . Rectal cancer Neg Hx   . Stomach cancer Neg Hx     Social  History   Socioeconomic History  . Marital status: Married    Spouse name: Not on file  . Number of children: 2  . Years of education: college 4  . Highest education level: Not on file  Occupational History  . Occupation: Retired  Tobacco Use  . Smoking status: Former Smoker    Packs/day: 1.00    Years: 25.00    Pack years: 25.00    Types: Cigarettes    Quit date: 07/27/1994    Years since quitting: 25.6  . Smokeless tobacco: Never Used  Vaping Use  . Vaping Use: Never used  Substance and Sexual Activity  . Alcohol use: Yes    Alcohol/week: 1.0 standard drink    Types: 1 Glasses of wine per week    Comment: occasional  . Drug use: No  . Sexual activity: Not on file  Other Topics Concern  . Not on file  Social History Narrative   The patient is widowed. 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 He has no history of alcohol use. He has previously done regular exercise until developing a back problem.      Patient drinks about 3-4 cups of coffee daily.   Patient is right handed.    Social Determinants of Health   Financial Resource Strain:   . Difficulty of Paying Living Expenses:  Food Insecurity:   . Worried About Charity fundraiser in the Last Year:   . Arboriculturist in the Last Year:   Transportation Needs:   . Film/video editor (Medical):   Marland Kitchen Lack of Transportation (Non-Medical):   Physical Activity:   . Days of Exercise per Week:   . Minutes of Exercise per Session:   Stress:   . Feeling of Stress :   Social Connections:   . Frequency of Communication with Friends and Family:   . Frequency of Social Gatherings with Friends and Family:   . Attends Religious Services:   . Active Member of Clubs or Organizations:   . Attends Archivist Meetings:   Marland Kitchen Marital Status:   Intimate Partner Violence:   . Fear of Current or Ex-Partner:   . Emotionally Abused:   Marland Kitchen Physically Abused:   . Sexually Abused:     Outpatient Medications  Prior to Visit  Medication Sig Dispense Refill  . aspirin 325 MG tablet Take 325 mg by mouth daily.      Marland Kitchen b complex vitamins tablet Take 1 tablet by mouth daily.    . calcium carbonate (OS-CAL) 600 MG TABS Take 600 mg by mouth daily.      . diphenhydrAMINE (BENADRYL) 25 mg capsule Take 25 mg by mouth as needed (insomnia).    Marland Kitchen doxycycline (VIBRA-TABS) 100 MG tablet Take 1 tablet (100 mg total) by mouth 2 (two) times daily. 28 tablet 0  . fluticasone (FLONASE) 50 MCG/ACT nasal spray SPRAY 2 SPRAYS INTO EACH NOSTRIL EVERY DAY 16 mL 1  . gabapentin (NEURONTIN) 300 MG capsule TAKE 1 CAPSULE BY MOUTH EVERY MORNING AND 2 CAPSULES BY MOUTH BEFORE BEDTIME 270 capsule 0  . hydrocortisone (ANUSOL-HC) 25 MG suppository Place 1 suppository (25 mg total) rectally 2 (two) times daily. 12 suppository 1  . Multiple Vitamin (MULTIVITAMIN) capsule Take 1 capsule by mouth daily.      . vitamin B-12 (CYANOCOBALAMIN) 1000 MCG tablet Take 1,000 mcg by mouth daily.      Marland Kitchen VITAMIN D, CHOLECALCIFEROL, PO Take 1,000 Units by mouth daily.     Marland Kitchen doxazosin (CARDURA) 2 MG tablet TAKE 1 TABLET BY MOUTH DAILY AT BEDTIME 90 tablet 0   No facility-administered medications prior to visit.    No Known Allergies  ROS Review of Systems  General:   Denies fever, chills, unexplained weight loss.  Optho/Auditory:   Denies visual changes, blurred vision/LOV Respiratory:   Denies SOB, DOE more than baseline levels.  Cardiovascular:   Denies chest pain, palpitations, new onset peripheral edema  Gastrointestinal:   Denies nausea, vomiting, diarrhea.  Genitourinary: Denies dysuria, freq/ urgency, flank pain o  Endocrine:     Denies hot or cold intolerance, polyuria, polydipsia. Musculoskeletal:   Denies unexplained myalgias, joint swelling, unexplained arthralgias, gait problems.  Skin:  Denies rash, suspicious lesions Neurological:     Denies dizziness, unexplained weakness, numbness  Psychiatric/Behavioral:   Denies mood  changes, suicidal or homicidal ideations, hallucinations    Objective:    Physical Exam General:  Well Developed, well nourished, appropriate for stated age.  Neuro:  Alert and oriented,  extra-ocular muscles intact  HEENT:  Normocephalic, atraumatic, neck supple Skin:  no gross rash, warm, pink. Cardiac:  RRR, S1 S2 w/o murmur Respiratory:  ECTA B/L and A/P, Not using accessory muscles, speaking in full sentences- unlabored. Vascular:  Ext warm, no cyanosis apprec.; cap RF less 2 sec. No edema present.  Psych:  No HI/SI, judgement and insight good, Euthymic mood. Full Affect.  BP 130/74   Pulse 68   Temp 97.6 F (36.4 C) (Oral)   Ht 5\' 11"  (1.803 m)   Wt 187 lb 4.8 oz (85 kg)   SpO2 98%   BMI 26.12 kg/m  Wt Readings from Last 3 Encounters:  03/04/20 187 lb 4.8 oz (85 kg)  11/26/19 192 lb (87.1 kg)  10/11/19 196 lb 6.4 oz (89.1 kg)     Health Maintenance Due  Topic Date Due  . COVID-19 Vaccine (1) Never done    There are no preventive care reminders to display for this patient.  Lab Results  Component Value Date   TSH 2.060 08/26/2019   Lab Results  Component Value Date   WBC 11.3 (H) 02/26/2020   HGB 16.2 02/26/2020   HCT 48.8 02/26/2020   MCV 95 02/26/2020   PLT 166 02/26/2020   Lab Results  Component Value Date   NA 144 02/26/2020   K 4.9 02/26/2020   CO2 24 02/26/2020   GLUCOSE 142 (H) 02/26/2020   BUN 18 02/26/2020   CREATININE 1.08 02/26/2020   BILITOT 1.0 02/26/2020   ALKPHOS 59 02/26/2020   AST 17 02/26/2020   ALT 14 02/26/2020   PROT 5.3 (L) 02/26/2020   ALBUMIN 3.6 (L) 02/26/2020   CALCIUM 8.3 (L) 02/26/2020   ANIONGAP 7 09/04/2019   Lab Results  Component Value Date   CHOL 178 08/26/2019   Lab Results  Component Value Date   HDL 47 08/26/2019   Lab Results  Component Value Date   LDLCALC 115 (H) 08/26/2019   Lab Results  Component Value Date   TRIG 87 08/26/2019   Lab Results  Component Value Date   CHOLHDL 3.8 08/26/2019    Lab Results  Component Value Date   HGBA1C 5.6 08/26/2019      Assessment & Plan:   Problem List Items Addressed This Visit      Cardiovascular and Mediastinum   Hypertension - Primary   Relevant Medications   doxazosin (CARDURA) 2 MG tablet     Genitourinary   BPH (benign prostatic hyperplasia)   Relevant Medications   doxazosin (CARDURA) 2 MG tablet     Other   Spinal stenosis of lumbar region (Chronic)   HLD (hyperlipidemia) (Chronic)   Relevant Medications   doxazosin (CARDURA) 2 MG tablet      Meds ordered this encounter  Medications  . doxazosin (CARDURA) 2 MG tablet    Sig: TAKE 1 TABLET BY MOUTH DAILY AT BEDTIME    Dispense:  90 tablet    Refill:  1   HTN: - BP today is 153/74 HR 68, repeat BP 130/74- improved and at goal. - Continue ambulatory BP and pulse monitoring few times/wk to ensure BP staying stable. - Continue DASH diet. - Encourage to stay as active as possible. - Will continue to monitor.  HLD: - Last lipid panel wnl's with mildly elevated LDL- 115. - Follow heart healthy diet low in cholesterol, sat and trans fat to help keep hyperlipidemia stable and under control. - Will continue to monitor.   BPH: - Stable. - Continue Cardura. Provided refill.  Spinal stenosis of lumbar region: - Stable. - Continue Gabapentin.   Follow-up: Return in about 4 months (around 07/04/2020) for HTN, HLD, BPH.    Lorrene Reid, PA-C

## 2020-03-04 ENCOUNTER — Ambulatory Visit (INDEPENDENT_AMBULATORY_CARE_PROVIDER_SITE_OTHER): Payer: PPO | Admitting: Physician Assistant

## 2020-03-04 ENCOUNTER — Other Ambulatory Visit: Payer: Self-pay

## 2020-03-04 ENCOUNTER — Encounter: Payer: Self-pay | Admitting: Physician Assistant

## 2020-03-04 VITALS — BP 130/74 | HR 68 | Temp 97.6°F | Ht 71.0 in | Wt 187.3 lb

## 2020-03-04 DIAGNOSIS — I10 Essential (primary) hypertension: Secondary | ICD-10-CM | POA: Diagnosis not present

## 2020-03-04 DIAGNOSIS — E785 Hyperlipidemia, unspecified: Secondary | ICD-10-CM

## 2020-03-04 DIAGNOSIS — M48062 Spinal stenosis, lumbar region with neurogenic claudication: Secondary | ICD-10-CM

## 2020-03-04 DIAGNOSIS — N400 Enlarged prostate without lower urinary tract symptoms: Secondary | ICD-10-CM

## 2020-03-04 MED ORDER — DOXAZOSIN MESYLATE 2 MG PO TABS: ORAL_TABLET | ORAL | 1 refills | Status: DC

## 2020-03-04 NOTE — Patient Instructions (Signed)
DASH Eating Plan DASH stands for "Dietary Approaches to Stop Hypertension." The DASH eating plan is a healthy eating plan that has been shown to reduce high blood pressure (hypertension). It may also reduce your risk for type 2 diabetes, heart disease, and stroke. The DASH eating plan may also help with weight loss. What are tips for following this plan?  General guidelines  Avoid eating more than 2,300 mg (milligrams) of salt (sodium) a day. If you have hypertension, you may need to reduce your sodium intake to 1,500 mg a day.  Limit alcohol intake to no more than 1 drink a day for nonpregnant women and 2 drinks a day for men. One drink equals 12 oz of beer, 5 oz of wine, or 1 oz of hard liquor.  Work with your health care provider to maintain a healthy body weight or to lose weight. Ask what an ideal weight is for you.  Get at least 30 minutes of exercise that causes your heart to beat faster (aerobic exercise) most days of the week. Activities may include walking, swimming, or biking.  Work with your health care provider or diet and nutrition specialist (dietitian) to adjust your eating plan to your individual calorie needs. Reading food labels   Check food labels for the amount of sodium per serving. Choose foods with less than 5 percent of the Daily Value of sodium. Generally, foods with less than 300 mg of sodium per serving fit into this eating plan.  To find whole grains, look for the word "whole" as the first word in the ingredient list. Shopping  Buy products labeled as "low-sodium" or "no salt added."  Buy fresh foods. Avoid canned foods and premade or frozen meals. Cooking  Avoid adding salt when cooking. Use salt-free seasonings or herbs instead of table salt or sea salt. Check with your health care provider or pharmacist before using salt substitutes.  Do not fry foods. Cook foods using healthy methods such as baking, boiling, grilling, and broiling instead.  Cook with  heart-healthy oils, such as olive, canola, soybean, or sunflower oil. Meal planning  Eat a balanced diet that includes: ? 5 or more servings of fruits and vegetables each day. At each meal, try to fill half of your plate with fruits and vegetables. ? Up to 6-8 servings of whole grains each day. ? Less than 6 oz of lean meat, poultry, or fish each day. A 3-oz serving of meat is about the same size as a deck of cards. One egg equals 1 oz. ? 2 servings of low-fat dairy each day. ? A serving of nuts, seeds, or beans 5 times each week. ? Heart-healthy fats. Healthy fats called Omega-3 fatty acids are found in foods such as flaxseeds and coldwater fish, like sardines, salmon, and mackerel.  Limit how much you eat of the following: ? Canned or prepackaged foods. ? Food that is high in trans fat, such as fried foods. ? Food that is high in saturated fat, such as fatty meat. ? Sweets, desserts, sugary drinks, and other foods with added sugar. ? Full-fat dairy products.  Do not salt foods before eating.  Try to eat at least 2 vegetarian meals each week.  Eat more home-cooked food and less restaurant, buffet, and fast food.  When eating at a restaurant, ask that your food be prepared with less salt or no salt, if possible. What foods are recommended? The items listed may not be a complete list. Talk with your dietitian about   what dietary choices are best for you. Grains Whole-grain or whole-wheat bread. Whole-grain or whole-wheat pasta. Brown rice. Oatmeal. Quinoa. Bulgur. Whole-grain and low-sodium cereals. Pita bread. Low-fat, low-sodium crackers. Whole-wheat flour tortillas. Vegetables Fresh or frozen vegetables (raw, steamed, roasted, or grilled). Low-sodium or reduced-sodium tomato and vegetable juice. Low-sodium or reduced-sodium tomato sauce and tomato paste. Low-sodium or reduced-sodium canned vegetables. Fruits All fresh, dried, or frozen fruit. Canned fruit in natural juice (without  added sugar). Meat and other protein foods Skinless chicken or turkey. Ground chicken or turkey. Pork with fat trimmed off. Fish and seafood. Egg whites. Dried beans, peas, or lentils. Unsalted nuts, nut butters, and seeds. Unsalted canned beans. Lean cuts of beef with fat trimmed off. Low-sodium, lean deli meat. Dairy Low-fat (1%) or fat-free (skim) milk. Fat-free, low-fat, or reduced-fat cheeses. Nonfat, low-sodium ricotta or cottage cheese. Low-fat or nonfat yogurt. Low-fat, low-sodium cheese. Fats and oils Soft margarine without trans fats. Vegetable oil. Low-fat, reduced-fat, or light mayonnaise and salad dressings (reduced-sodium). Canola, safflower, olive, soybean, and sunflower oils. Avocado. Seasoning and other foods Herbs. Spices. Seasoning mixes without salt. Unsalted popcorn and pretzels. Fat-free sweets. What foods are not recommended? The items listed may not be a complete list. Talk with your dietitian about what dietary choices are best for you. Grains Baked goods made with fat, such as croissants, muffins, or some breads. Dry pasta or rice meal packs. Vegetables Creamed or fried vegetables. Vegetables in a cheese sauce. Regular canned vegetables (not low-sodium or reduced-sodium). Regular canned tomato sauce and paste (not low-sodium or reduced-sodium). Regular tomato and vegetable juice (not low-sodium or reduced-sodium). Pickles. Olives. Fruits Canned fruit in a light or heavy syrup. Fried fruit. Fruit in cream or butter sauce. Meat and other protein foods Fatty cuts of meat. Ribs. Fried meat. Bacon. Sausage. Bologna and other processed lunch meats. Salami. Fatback. Hotdogs. Bratwurst. Salted nuts and seeds. Canned beans with added salt. Canned or smoked fish. Whole eggs or egg yolks. Chicken or turkey with skin. Dairy Whole or 2% milk, cream, and half-and-half. Whole or full-fat cream cheese. Whole-fat or sweetened yogurt. Full-fat cheese. Nondairy creamers. Whipped toppings.  Processed cheese and cheese spreads. Fats and oils Butter. Stick margarine. Lard. Shortening. Ghee. Bacon fat. Tropical oils, such as coconut, palm kernel, or palm oil. Seasoning and other foods Salted popcorn and pretzels. Onion salt, garlic salt, seasoned salt, table salt, and sea salt. Worcestershire sauce. Tartar sauce. Barbecue sauce. Teriyaki sauce. Soy sauce, including reduced-sodium. Steak sauce. Canned and packaged gravies. Fish sauce. Oyster sauce. Cocktail sauce. Horseradish that you find on the shelf. Ketchup. Mustard. Meat flavorings and tenderizers. Bouillon cubes. Hot sauce and Tabasco sauce. Premade or packaged marinades. Premade or packaged taco seasonings. Relishes. Regular salad dressings. Where to find more information:  National Heart, Lung, and Blood Institute: www.nhlbi.nih.gov  American Heart Association: www.heart.org Summary  The DASH eating plan is a healthy eating plan that has been shown to reduce high blood pressure (hypertension). It may also reduce your risk for type 2 diabetes, heart disease, and stroke.  With the DASH eating plan, you should limit salt (sodium) intake to 2,300 mg a day. If you have hypertension, you may need to reduce your sodium intake to 1,500 mg a day.  When on the DASH eating plan, aim to eat more fresh fruits and vegetables, whole grains, lean proteins, low-fat dairy, and heart-healthy fats.  Work with your health care provider or diet and nutrition specialist (dietitian) to adjust your eating plan to your   individual calorie needs. This information is not intended to replace advice given to you by your health care provider. Make sure you discuss any questions you have with your health care provider. Document Revised: 08/25/2017 Document Reviewed: 09/05/2016 Elsevier Patient Education  2020 Elsevier Inc.  

## 2020-04-13 ENCOUNTER — Telehealth: Payer: Self-pay | Admitting: Physician Assistant

## 2020-04-13 MED ORDER — GABAPENTIN 300 MG PO CAPS: 300.0000 mg | ORAL_CAPSULE | Freq: Three times a day (TID) | ORAL | 0 refills | Status: DC

## 2020-04-13 NOTE — Telephone Encounter (Signed)
Refill sent to requested pharmacy. AS, CMA 

## 2020-04-13 NOTE — Telephone Encounter (Signed)
Patient needs refill on Gabapentin 300 mg capsules takes three each day. [facility] on [address].

## 2020-04-13 NOTE — Addendum Note (Signed)
Addended by: Mickel Crow on: 04/13/2020 03:45 PM   Modules accepted: Orders

## 2020-06-12 DIAGNOSIS — C44222 Squamous cell carcinoma of skin of right ear and external auricular canal: Secondary | ICD-10-CM | POA: Diagnosis not present

## 2020-06-12 DIAGNOSIS — L738 Other specified follicular disorders: Secondary | ICD-10-CM | POA: Diagnosis not present

## 2020-06-12 DIAGNOSIS — Z85828 Personal history of other malignant neoplasm of skin: Secondary | ICD-10-CM | POA: Diagnosis not present

## 2020-06-12 DIAGNOSIS — Z08 Encounter for follow-up examination after completed treatment for malignant neoplasm: Secondary | ICD-10-CM | POA: Diagnosis not present

## 2020-06-12 DIAGNOSIS — L821 Other seborrheic keratosis: Secondary | ICD-10-CM | POA: Diagnosis not present

## 2020-06-12 DIAGNOSIS — L570 Actinic keratosis: Secondary | ICD-10-CM | POA: Diagnosis not present

## 2020-06-12 DIAGNOSIS — D225 Melanocytic nevi of trunk: Secondary | ICD-10-CM | POA: Diagnosis not present

## 2020-06-23 DIAGNOSIS — H52203 Unspecified astigmatism, bilateral: Secondary | ICD-10-CM | POA: Diagnosis not present

## 2020-06-23 DIAGNOSIS — Z961 Presence of intraocular lens: Secondary | ICD-10-CM | POA: Diagnosis not present

## 2020-06-23 DIAGNOSIS — H26491 Other secondary cataract, right eye: Secondary | ICD-10-CM | POA: Diagnosis not present

## 2020-07-08 ENCOUNTER — Encounter: Payer: Self-pay | Admitting: Physician Assistant

## 2020-07-08 ENCOUNTER — Other Ambulatory Visit: Payer: Self-pay

## 2020-07-08 ENCOUNTER — Ambulatory Visit (INDEPENDENT_AMBULATORY_CARE_PROVIDER_SITE_OTHER): Payer: PPO | Admitting: Physician Assistant

## 2020-07-08 VITALS — BP 119/73 | HR 72 | Ht 71.0 in | Wt 190.9 lb

## 2020-07-08 DIAGNOSIS — E785 Hyperlipidemia, unspecified: Secondary | ICD-10-CM

## 2020-07-08 DIAGNOSIS — D696 Thrombocytopenia, unspecified: Secondary | ICD-10-CM

## 2020-07-08 DIAGNOSIS — I10 Essential (primary) hypertension: Secondary | ICD-10-CM | POA: Diagnosis not present

## 2020-07-08 DIAGNOSIS — M48062 Spinal stenosis, lumbar region with neurogenic claudication: Secondary | ICD-10-CM | POA: Diagnosis not present

## 2020-07-08 DIAGNOSIS — C801 Malignant (primary) neoplasm, unspecified: Secondary | ICD-10-CM | POA: Diagnosis not present

## 2020-07-08 MED ORDER — GABAPENTIN 300 MG PO CAPS: 300.0000 mg | ORAL_CAPSULE | Freq: Three times a day (TID) | ORAL | 0 refills | Status: DC

## 2020-07-08 NOTE — Assessment & Plan Note (Signed)
-  Last lipid panel: LDL elevated -Recommend to follow a heart healthy and reduce saturated and trans fats. -Stay as active as possible. -Plan to recheck lipid panel with CPE.

## 2020-07-08 NOTE — Assessment & Plan Note (Signed)
-  Followed by Dermatology 

## 2020-07-08 NOTE — Assessment & Plan Note (Signed)
-  Last cbc w/ diff: platelets 166, stable

## 2020-07-08 NOTE — Assessment & Plan Note (Addendum)
-  Stable -Continue current medication regimen. Provided refill. -Reviewed last Neurology consult and advised to follow up prn.

## 2020-07-08 NOTE — Assessment & Plan Note (Signed)
-  BP stable -Continue current medication regimen. -Follow a low sodium diet. -Stay well hydrated. -Will continue to monitor.

## 2020-07-08 NOTE — Progress Notes (Signed)
Established Patient Office Visit  Subjective:  Patient ID: Larry Harris, male    DOB: Dec 29, 1940  Age: 79 y.o. MRN: [id number]  CC:  Chief Complaint  Patient presents with  . Hypertension  . Hyperlipidemia    HPI Larry Harris presents for hypertension and hyperlipidemia. Has no acute concerns today. Requesting refill of Gabapentin.  HTN: Pt denies chest pain, palpitations, dizziness or lower extremity swelling. Taking medication as directed without side effects. Checks BP at home sometimes and readings range in 130s/70s. Pt tries to stay hydrated.  HLD: Pt reports he was on a statin many years ago. Prefers to manage with diet and lifestyle changes. Reports his diet has not been as healthy as before. He will work on reducing red meat. Tries to stay as active as possible, which is limited by his chronic back pain/issues.     Past Medical History:  Diagnosis Date  . Acute rhinitis   . Allergic rhinitis, cause unspecified   . Allergy   . BPH (benign prostatic hyperplasia)   . CAD (coronary artery disease)   . Cancer ([place])    scalp basal cell skin ca  . Cataract    bilateral cateracts removed  . Clotting disorder ([place])   . Hypercholesteremia   . Hypotension   . Neuralgia, neuritis, and radiculitis, unspecified   . Osteoarthritis   . Spinal stenosis of lumbar region 06/04/2014  . Thrombocytopenia, unspecified ([place])     Past Surgical History:  Procedure Laterality Date  . APPENDECTOMY  1958  . bilateral cateracts removed    . COLONOSCOPY    . HERNIA REPAIR  1965   x2    Family History  Problem Relation Age of Onset  . Stroke Mother 61  . Prostate cancer Father 24  . Heart failure Brother 17  . Lung cancer Sister   . Colon cancer Neg Hx   . Esophageal cancer Neg Hx   . Rectal cancer Neg Hx   . Stomach cancer Neg Hx     Social History   Socioeconomic History  . Marital status: Married    Spouse name: Not on file  . Number of children: 2  .  Years of education: college 4  . Highest education level: Not on file  Occupational History  . Occupation: Retired  Tobacco Use  . Smoking status: Former Smoker    Packs/day: 1.00    Years: 25.00    Pack years: 25.00    Types: Cigarettes    Quit date: 07/27/1994    Years since quitting: 25.9  . Smokeless tobacco: Never Used  Vaping Use  . Vaping Use: Never used  Substance and Sexual Activity  . Alcohol use: Yes    Alcohol/week: 1.0 standard drink    Types: 1 Glasses of wine per week    Comment: occasional  . Drug use: No  . Sexual activity: Not on file  Other Topics Concern  . Not on file  Social History Narrative   The patient is widowed. 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 He has no history of alcohol use. He has previously done regular exercise until developing a back problem.      Patient drinks about 3-4 cups of coffee daily.   Patient is right handed.    Social Determinants of Health   Financial Resource Strain:   . Difficulty of Paying Living Expenses: Not on file  Food Insecurity:   . Worried About Charity fundraiser  in the Last Year: Not on file  . Ran Out of Food in the Last Year: Not on file  Transportation Needs:   . Lack of Transportation (Medical): Not on file  . Lack of Transportation (Non-Medical): Not on file  Physical Activity:   . Days of Exercise per Week: Not on file  . Minutes of Exercise per Session: Not on file  Stress:   . Feeling of Stress : Not on file  Social Connections:   . Frequency of Communication with Friends and Family: Not on file  . Frequency of Social Gatherings with Friends and Family: Not on file  . Attends Religious Services: Not on file  . Active Member of Clubs or Organizations: Not on file  . Attends Archivist Meetings: Not on file  . Marital Status: Not on file  Intimate Partner Violence:   . Fear of Current or Ex-Partner: Not on file  . Emotionally Abused: Not on file  . Physically Abused:  Not on file  . Sexually Abused: Not on file    Outpatient Medications Prior to Visit  Medication Sig Dispense Refill  . aspirin 325 MG tablet Take 325 mg by mouth daily.      Marland Kitchen b complex vitamins tablet Take 1 tablet by mouth daily.    . calcium carbonate (OS-CAL) 600 MG TABS Take 600 mg by mouth daily.      . diphenhydrAMINE (BENADRYL) 25 mg capsule Take 25 mg by mouth as needed (insomnia).    Marland Kitchen doxazosin (CARDURA) 2 MG tablet TAKE 1 TABLET BY MOUTH DAILY AT BEDTIME 90 tablet 1  . fluticasone (FLONASE) 50 MCG/ACT nasal spray SPRAY 2 SPRAYS INTO EACH NOSTRIL EVERY DAY 16 mL 1  . hydrocortisone (ANUSOL-HC) 25 MG suppository Place 1 suppository (25 mg total) rectally 2 (two) times daily. 12 suppository 1  . Multiple Vitamin (MULTIVITAMIN) capsule Take 1 capsule by mouth daily.      . vitamin B-12 (CYANOCOBALAMIN) 1000 MCG tablet Take 1,000 mcg by mouth daily.      Marland Kitchen VITAMIN D, CHOLECALCIFEROL, PO Take 1,000 Units by mouth daily.     Marland Kitchen gabapentin (NEURONTIN) 300 MG capsule Take 1 capsule (300 mg total) by mouth 3 (three) times daily. 270 capsule 0  . doxycycline (VIBRA-TABS) 100 MG tablet Take 1 tablet (100 mg total) by mouth 2 (two) times daily. 28 tablet 0   No facility-administered medications prior to visit.    No Known Allergies  ROS Review of Systems A fourteen system review of systems was performed and found to be positive as per HPI.  Objective:    Physical Exam  General:  Well Developed, well nourished, appropriate for stated age.  Neuro:  Alert and oriented,  extra-ocular muscles intact  HEENT:  Normocephalic, atraumatic, neck supple  Skin:  no gross rash, warm, pink. Cardiac:  RRR, S1 S2 Respiratory:  ECTA B/L and A/P, Not using accessory muscles, speaking in full sentences- unlabored. Vascular:  Ext warm, no cyanosis apprec.; cap RF less 2 sec. No gross edema Psych:  No HI/SI, judgement and insight good, Euthymic mood. Full Affect.   BP 119/73   Pulse 72   Ht 5'  11" (1.803 m)   Wt 190 lb 14.4 oz (86.6 kg)   SpO2 97%   BMI 26.63 kg/m  Wt Readings from Last 3 Encounters:  07/08/20 190 lb 14.4 oz (86.6 kg)  03/04/20 187 lb 4.8 oz (85 kg)  11/26/19 192 lb (87.1 kg)  Health Maintenance Due  Topic Date Due  . COVID-19 Vaccine (1) Never done    There are no preventive care reminders to display for this patient.  Lab Results  Component Value Date   TSH 2.060 08/26/2019   Lab Results  Component Value Date   WBC 11.3 (H) 02/26/2020   HGB 16.2 02/26/2020   HCT 48.8 02/26/2020   MCV 95 02/26/2020   PLT 166 02/26/2020   Lab Results  Component Value Date   NA 144 02/26/2020   K 4.9 02/26/2020   CO2 24 02/26/2020   GLUCOSE 142 (H) 02/26/2020   BUN 18 02/26/2020   CREATININE 1.08 02/26/2020   BILITOT 1.0 02/26/2020   ALKPHOS 59 02/26/2020   AST 17 02/26/2020   ALT 14 02/26/2020   PROT 5.3 (L) 02/26/2020   ALBUMIN 3.6 (L) 02/26/2020   CALCIUM 8.3 (L) 02/26/2020   ANIONGAP 7 09/04/2019   Lab Results  Component Value Date   CHOL 178 08/26/2019   Lab Results  Component Value Date   HDL 47 08/26/2019   Lab Results  Component Value Date   LDLCALC 115 (H) 08/26/2019   Lab Results  Component Value Date   TRIG 87 08/26/2019   Lab Results  Component Value Date   CHOLHDL 3.8 08/26/2019   Lab Results  Component Value Date   HGBA1C 5.6 08/26/2019      Assessment & Plan:   Problem List Items Addressed This Visit      Cardiovascular and Mediastinum   Hypertension    -BP stable -Continue current medication regimen. -Follow a low sodium diet. -Stay well hydrated. -Will continue to monitor.        Other   Spinal stenosis of lumbar region (Chronic)    -Stable -Continue current medication regimen. Provided refill. -Reviewed last Neurology consult and advised to follow up prn.      Low platelet count- chronically (Chronic)    -Last cbc w/ diff: platelets 166, stable      Cancer ([facility]) (Chronic)    -Followed by  Dermatology.      HLD (hyperlipidemia) - Primary (Chronic)    -Last lipid panel: LDL elevated -Recommend to follow a heart healthy and reduce saturated and trans fats. -Stay as active as possible. -Plan to recheck lipid panel with CPE.         Meds ordered this encounter  Medications  . gabapentin (NEURONTIN) 300 MG capsule    Sig: Take 1 capsule (300 mg total) by mouth 3 (three) times daily.    Dispense:  270 capsule    Refill:  0    Follow-up: Return in about 3 months (around 10/08/2020) for CPE and FBW (include PSA and A1c if covered by insurance) a few days before.    Lorrene Reid, PA-C

## 2020-07-08 NOTE — Patient Instructions (Signed)

## 2020-07-09 DIAGNOSIS — H26491 Other secondary cataract, right eye: Secondary | ICD-10-CM | POA: Diagnosis not present

## 2020-07-27 ENCOUNTER — Ambulatory Visit: Payer: PPO | Attending: Internal Medicine

## 2020-07-27 DIAGNOSIS — Z23 Encounter for immunization: Secondary | ICD-10-CM

## 2020-07-27 NOTE — Progress Notes (Signed)
° °  Covid-19 Vaccination [facility]  Name:  Larry Harris    MRN: [id number] DOB: 1941/08/25  07/27/2020  [name] was observed post Covid-19 immunization for 15 minutes without incident. He was provided with Vaccine Information Sheet and instruction to access the V-Safe system.   [name] was instructed to call 911 with any severe reactions post vaccine:  Difficulty breathing   Swelling of face and throat   A fast heartbeat   A bad rash all over body   Dizziness and weakness

## 2020-09-22 ENCOUNTER — Telehealth: Payer: Self-pay | Admitting: Physician Assistant

## 2020-09-22 NOTE — Telephone Encounter (Signed)
called asking about at home Covid testing- advised nearest pharmacy

## 2020-10-04 ENCOUNTER — Other Ambulatory Visit: Payer: Self-pay | Admitting: Physician Assistant

## 2020-10-04 DIAGNOSIS — I10 Essential (primary) hypertension: Secondary | ICD-10-CM

## 2020-10-04 DIAGNOSIS — E785 Hyperlipidemia, unspecified: Secondary | ICD-10-CM

## 2020-10-04 DIAGNOSIS — Z0000 Encounter for general adult medical examination without abnormal findings: Secondary | ICD-10-CM

## 2020-10-05 ENCOUNTER — Other Ambulatory Visit: Payer: PPO

## 2020-10-05 ENCOUNTER — Other Ambulatory Visit: Payer: Self-pay

## 2020-10-05 DIAGNOSIS — I10 Essential (primary) hypertension: Secondary | ICD-10-CM | POA: Diagnosis not present

## 2020-10-05 DIAGNOSIS — E785 Hyperlipidemia, unspecified: Secondary | ICD-10-CM | POA: Diagnosis not present

## 2020-10-05 DIAGNOSIS — Z0000 Encounter for general adult medical examination without abnormal findings: Secondary | ICD-10-CM | POA: Diagnosis not present

## 2020-10-06 LAB — LIPID PANEL
Chol/HDL Ratio: 3.4 ratio (ref 0.0–5.0)
Cholesterol, Total: 158 mg/dL (ref 100–199)
HDL: 47 mg/dL (ref >39)
LDL Chol Calc (NIH): 96 mg/dL (ref 0–99)
Triglycerides: 80 mg/dL (ref 0–149)
VLDL Cholesterol Cal: 15 mg/dL (ref 5–40)

## 2020-10-06 LAB — CBC
Hematocrit: 45.6 % (ref 37.5–51.0)
Hemoglobin: 15 g/dL (ref 13.0–17.7)
MCH: 31.6 pg (ref 26.6–33.0)
MCHC: 32.9 g/dL (ref 31.5–35.7)
MCV: 96 fL (ref 79–97)
Platelets: 261 x10E3/uL (ref 150–450)
RBC: 4.75 x10E6/uL (ref 4.14–5.80)
RDW: 13.3 % (ref 11.6–15.4)
WBC: 7.5 x10E3/uL (ref 3.4–10.8)

## 2020-10-06 LAB — COMPREHENSIVE METABOLIC PANEL WITH GFR
ALT: 12 IU/L (ref 0–44)
AST: 12 IU/L (ref 0–40)
Albumin/Globulin Ratio: 1.8 (ref 1.2–2.2)
Albumin: 3.3 g/dL — ABNORMAL LOW (ref 3.6–4.6)
Alkaline Phosphatase: 58 IU/L (ref 44–121)
BUN/Creatinine Ratio: 18 (ref 10–24)
BUN: 18 mg/dL (ref 8–27)
Bilirubin Total: 0.6 mg/dL (ref 0.0–1.2)
CO2: 26 mmol/L (ref 20–29)
Calcium: 8.6 mg/dL (ref 8.6–10.2)
Chloride: 105 mmol/L (ref 96–106)
Creatinine, Ser: 1.01 mg/dL (ref 0.76–1.27)
GFR calc Af Amer: 80 mL/min/1.73
GFR calc non Af Amer: 69 mL/min/1.73
Globulin, Total: 1.8 g/dL (ref 1.5–4.5)
Glucose: 85 mg/dL (ref 65–99)
Potassium: 4.2 mmol/L (ref 3.5–5.2)
Sodium: 141 mmol/L (ref 134–144)
Total Protein: 5.1 g/dL — ABNORMAL LOW (ref 6.0–8.5)

## 2020-10-06 LAB — HEMOGLOBIN A1C
Est. average glucose Bld gHb Est-mCnc: 114 mg/dL
Hgb A1c MFr Bld: 5.6 % (ref 4.8–5.6)

## 2020-10-06 LAB — TSH: TSH: 3.32 u[IU]/mL (ref 0.450–4.500)

## 2020-10-08 ENCOUNTER — Encounter: Payer: PPO | Admitting: Physician Assistant

## 2020-10-08 ENCOUNTER — Other Ambulatory Visit: Payer: Self-pay | Admitting: Physician Assistant

## 2020-11-03 DIAGNOSIS — Z20822 Contact with and (suspected) exposure to covid-19: Secondary | ICD-10-CM | POA: Diagnosis not present

## 2020-11-23 ENCOUNTER — Other Ambulatory Visit: Payer: Self-pay | Admitting: Physician Assistant

## 2020-11-23 DIAGNOSIS — N400 Enlarged prostate without lower urinary tract symptoms: Secondary | ICD-10-CM

## 2020-11-26 ENCOUNTER — Ambulatory Visit: Payer: PPO | Admitting: Internal Medicine

## 2020-12-10 DIAGNOSIS — Z08 Encounter for follow-up examination after completed treatment for malignant neoplasm: Secondary | ICD-10-CM | POA: Diagnosis not present

## 2020-12-10 DIAGNOSIS — L988 Other specified disorders of the skin and subcutaneous tissue: Secondary | ICD-10-CM | POA: Diagnosis not present

## 2020-12-10 DIAGNOSIS — D485 Neoplasm of uncertain behavior of skin: Secondary | ICD-10-CM | POA: Diagnosis not present

## 2020-12-10 DIAGNOSIS — L905 Scar conditions and fibrosis of skin: Secondary | ICD-10-CM | POA: Diagnosis not present

## 2020-12-10 DIAGNOSIS — D225 Melanocytic nevi of trunk: Secondary | ICD-10-CM | POA: Diagnosis not present

## 2020-12-10 DIAGNOSIS — L570 Actinic keratosis: Secondary | ICD-10-CM | POA: Diagnosis not present

## 2020-12-10 DIAGNOSIS — L821 Other seborrheic keratosis: Secondary | ICD-10-CM | POA: Diagnosis not present

## 2020-12-10 DIAGNOSIS — Z85828 Personal history of other malignant neoplasm of skin: Secondary | ICD-10-CM | POA: Diagnosis not present

## 2020-12-16 ENCOUNTER — Encounter: Payer: Self-pay | Admitting: Physician Assistant

## 2020-12-16 ENCOUNTER — Ambulatory Visit (INDEPENDENT_AMBULATORY_CARE_PROVIDER_SITE_OTHER): Payer: PPO | Admitting: Physician Assistant

## 2020-12-16 ENCOUNTER — Other Ambulatory Visit: Payer: Self-pay

## 2020-12-16 VITALS — BP 124/72 | HR 89 | Temp 98.3°F | Ht 72.0 in | Wt 193.7 lb

## 2020-12-16 DIAGNOSIS — E785 Hyperlipidemia, unspecified: Secondary | ICD-10-CM

## 2020-12-16 DIAGNOSIS — Z125 Encounter for screening for malignant neoplasm of prostate: Secondary | ICD-10-CM | POA: Diagnosis not present

## 2020-12-16 DIAGNOSIS — N400 Enlarged prostate without lower urinary tract symptoms: Secondary | ICD-10-CM | POA: Diagnosis not present

## 2020-12-16 DIAGNOSIS — Z0000 Encounter for general adult medical examination without abnormal findings: Secondary | ICD-10-CM

## 2020-12-16 NOTE — Patient Instructions (Signed)
Heart-Healthy Eating Plan Heart-healthy meal planning includes:  Eating less unhealthy fats.  Eating more healthy fats.  Making other changes in your diet. Talk with your doctor or a diet specialist (dietitian) to create an eating plan that is right for you. What is my plan? Your doctor may recommend an eating plan that includes:  Total fat: ______% or less of total calories a day.  Saturated fat: ______% or less of total calories a day.  Cholesterol: less than _________mg a day. What are tips for following this plan? Cooking Avoid frying your food. Try to bake, boil, grill, or broil it instead. You can also reduce fat by:  Removing the skin from poultry.  Removing all visible fats from meats.  Steaming vegetables in water or broth. Meal planning  At meals, divide your plate into four equal parts: ? Fill one-half of your plate with vegetables and green salads. ? Fill one-fourth of your plate with whole grains. ? Fill one-fourth of your plate with lean protein foods.  Eat 4-5 servings of vegetables per day. A serving of vegetables is: ? 1 cup of raw or cooked vegetables. ? 2 cups of raw leafy greens.  Eat 4-5 servings of fruit per day. A serving of fruit is: ? 1 medium whole fruit. ?  cup of dried fruit. ?  cup of fresh, frozen, or canned fruit. ?  cup of 100% fruit juice.  Eat more foods that have soluble fiber. These are apples, broccoli, carrots, beans, peas, and barley. Try to get 20-30 g of fiber per day.  Eat 4-5 servings of nuts, legumes, and seeds per week: ? 1 serving of dried beans or legumes equals  cup after being cooked. ? 1 serving of nuts is  cup. ? 1 serving of seeds equals 1 tablespoon.   General information  Eat more home-cooked food. Eat less restaurant, buffet, and fast food.  Limit or avoid alcohol.  Limit foods that are high in starch and sugar.  Avoid fried foods.  Lose weight if you are overweight.  Keep track of how much salt  (sodium) you eat. This is important if you have high blood pressure. Ask your doctor to tell you more about this.  Try to add vegetarian meals each week. Fats  Choose healthy fats. These include olive oil and canola oil, flaxseeds, walnuts, almonds, and seeds.  Eat more omega-3 fats. These include salmon, mackerel, sardines, tuna, flaxseed oil, and ground flaxseeds. Try to eat fish at least 2 times each week.  Check food labels. Avoid foods with trans fats or high amounts of saturated fat.  Limit saturated fats. ? These are often found in animal products, such as meats, butter, and cream. ? These are also found in plant foods, such as palm oil, palm kernel oil, and coconut oil.  Avoid foods with partially hydrogenated oils in them. These have trans fats. Examples are stick margarine, some tub margarines, cookies, crackers, and other baked goods. What foods can I eat? Fruits All fresh, canned (in natural juice), or frozen fruits. Vegetables Fresh or frozen vegetables (raw, steamed, roasted, or grilled). Green salads. Grains Most grains. Choose whole wheat and whole grains most of the time. Rice and pasta, including brown rice and pastas made with whole wheat. Meats and other proteins Lean, well-trimmed beef, veal, pork, and lamb. Chicken and [place] without skin. All fish and shellfish. Wild duck, rabbit, pheasant, and venison. Egg whites or low-cholesterol egg substitutes. Dried beans, peas, lentils, and tofu. Seeds and  most nuts. Dairy Low-fat or nonfat cheeses, including ricotta and mozzarella. Skim or 1% milk that is liquid, powdered, or evaporated. Buttermilk that is made with low-fat milk. Nonfat or low-fat yogurt. Fats and oils Non-hydrogenated (trans-free) margarines. Vegetable oils, including soybean, sesame, sunflower, olive, peanut, safflower, corn, canola, and cottonseed. Salad dressings or mayonnaise made with a vegetable oil. Beverages Mineral water. Coffee and tea. Diet  carbonated beverages. Sweets and desserts Sherbet, gelatin, and fruit ice. Small amounts of dark chocolate. Limit all sweets and desserts. Seasonings and condiments All seasonings and condiments. The items listed above may not be a complete list of foods and drinks you can eat. Contact a dietitian for more options. What foods should I avoid? Fruits Canned fruit in heavy syrup. Fruit in cream or butter sauce. Fried fruit. Limit coconut. Vegetables Vegetables cooked in cheese, cream, or butter sauce. Fried vegetables. Grains Breads that are made with saturated or trans fats, oils, or whole milk. Croissants. Sweet rolls. Donuts. High-fat crackers, such as cheese crackers. Meats and other proteins Fatty meats, such as hot dogs, ribs, sausage, bacon, rib-eye roast or steak. High-fat deli meats, such as salami and bologna. Caviar. Domestic duck and goose. Organ meats, such as liver. Dairy Cream, sour cream, cream cheese, and creamed cottage cheese. Whole-milk cheeses. Whole or 2% milk that is liquid, evaporated, or condensed. Whole buttermilk. Cream sauce or high-fat cheese sauce. Yogurt that is made from whole milk. Fats and oils Meat fat, or shortening. Cocoa butter, hydrogenated oils, palm oil, coconut oil, palm kernel oil. Solid fats and shortenings, including bacon fat, salt pork, lard, and butter. Nondairy cream substitutes. Salad dressings with cheese or sour cream. Beverages Regular sodas and juice drinks with added sugar. Sweets and desserts Frosting. Pudding. Cookies. Cakes. Pies. Milk chocolate or white chocolate. Buttered syrups. Full-fat ice cream or ice cream drinks. The items listed above may not be a complete list of foods and drinks to avoid. Contact a dietitian for more information. Summary  Heart-healthy meal planning includes eating less unhealthy fats, eating more healthy fats, and making other changes in your diet.  Eat a balanced diet. This includes fruits and  vegetables, low-fat or nonfat dairy, lean protein, nuts and legumes, whole grains, and heart-healthy oils and fats. This information is not intended to replace advice given to you by your health care provider. Make sure you discuss any questions you have with your health care provider. Document Revised: 11/16/2017 Document Reviewed: 10/20/2017 Elsevier Patient Education  [place] 65 Years and Older, Male Preventive care refers to lifestyle choices and visits with your health care provider that can promote health and wellness. This includes:  A yearly physical exam. This is also called an annual wellness visit.  Regular dental and eye exams.  Immunizations.  Screening for certain conditions.  Healthy lifestyle choices, such as: ? Eating a healthy diet. ? Getting regular exercise. ? Not using drugs or products that contain nicotine and tobacco. ? Limiting alcohol use. What can I expect for my preventive care visit? Physical exam Your health care provider will check your:  Height and weight. These may be used to calculate your BMI (body mass index). BMI is a measurement that tells if you are at a healthy weight.  Heart rate and blood pressure.  Body temperature.  Skin for abnormal spots. Counseling Your health care provider may ask you questions about your:  Past medical problems.  Family's medical history.  Alcohol, tobacco, and drug use.  Emotional well-being.  Home life and relationship well-being.  Sexual activity.  Diet, exercise, and sleep habits.  History of falls.  Memory and ability to understand (cognition).  Work and work Statistician.  Access to firearms. What immunizations do I need? Vaccines are usually given at various ages, according to a schedule. Your health care provider will recommend vaccines for you based on your age, medical history, and lifestyle or other factors, such as travel or where you work.   What tests  do I need? Blood tests  Lipid and cholesterol levels. These may be checked every 5 years, or more often depending on your overall health.  Hepatitis C test.  Hepatitis B test. Screening  Lung cancer screening. You may have this screening every year starting at age 6 if you have a 30-pack-year history of smoking and currently smoke or have quit within the past 15 years.  Colorectal cancer screening. ? All adults should have this screening starting at age 23 and continuing until age 34. ? Your health care provider may recommend screening at age 50 if you are at increased risk. ? You will have tests every 1-10 years, depending on your results and the type of screening test.  Prostate cancer screening. Recommendations will vary depending on your family history and other risks.  Genital exam to check for testicular cancer or hernias.  Diabetes screening. ? This is done by checking your blood sugar (glucose) after you have not eaten for a while (fasting). ? You may have this done every 1-3 years.  Abdominal aortic aneurysm (AAA) screening. You may need this if you are a current or former smoker.  STD (sexually transmitted disease) testing, if you are at risk. Follow these instructions at home: Eating and drinking  Eat a diet that includes fresh fruits and vegetables, whole grains, lean protein, and low-fat dairy products. Limit your intake of foods with high amounts of sugar, saturated fats, and salt.  Take vitamin and mineral supplements as recommended by your health care provider.  Do not drink alcohol if your health care provider tells you not to drink.  If you drink alcohol: ? Limit how much you have to 0-2 drinks a day. ? Be aware of how much alcohol is in your drink. In the [place] one drink equals one 12 oz bottle of beer (355 mL), one 5 oz glass of wine (148 mL), or one 1 oz glass of hard liquor (44 mL).   Lifestyle  Take daily care of your teeth and gums. Brush your teeth  every morning and night with fluoride toothpaste. Floss one time each day.  Stay active. Exercise for at least 30 minutes 5 or more days each week.  Do not use any products that contain nicotine or tobacco, such as cigarettes, e-cigarettes, and chewing tobacco. If you need help quitting, ask your health care provider.  Do not use drugs.  If you are sexually active, practice safe sex. Use a condom or other form of protection to prevent STIs (sexually transmitted infections).  Talk with your health care provider about taking a low-dose aspirin or statin.  Find healthy ways to cope with stress, such as: ? Meditation, yoga, or listening to music. ? Journaling. ? Talking to a trusted person. ? Spending time with friends and family. Safety  Always wear your seat belt while driving or riding in a vehicle.  Do not drive: ? If you have been drinking alcohol. Do not ride with someone who has been drinking. ?  When you are tired or distracted. ? While texting.  Wear a helmet and other protective equipment during sports activities.  If you have firearms in your house, make sure you follow all gun safety procedures. What's next?  Visit your health care provider once a year for an annual wellness visit.  Ask your health care provider how often you should have your eyes and teeth checked.  Stay up to date on all vaccines. This information is not intended to replace advice given to you by your health care provider. Make sure you discuss any questions you have with your health care provider. Document Revised: 06/11/2019 Document Reviewed: 09/06/2018 Elsevier Patient Education  2021 [employer].

## 2020-12-16 NOTE — Progress Notes (Signed)
Male physical   Impression and Recommendations:    1. Healthcare maintenance   2. Hyperlipidemia, unspecified hyperlipidemia type   3. Benign prostatic hyperplasia without lower urinary tract symptoms   4. Screening for prostate cancer      1) Anticipatory Guidance: Skin CA prevention- continue to use sunscreen when outside along with skin surveillance; eating a balanced and modest diet; physical activity at least 25 minutes per day or minimum of 150 min/ week moderate to intense activity.  2) Immunizations / Screenings / Labs:   All immunizations are up-to-date per recommendations or will be updated today if pt allows.    - Patient understands with dental and vision screens they will schedule independently.  - Obtained CBC, CMP, HgA1c, Lipid panel, TSH when fasting. Most labs are essentially within normal limits or stable from prior. Lipid panel has improved. Declined Hep C and HIV screenings. - After shared decision making will collect PSA. - UTD on immunizations and colonoscopy.   3) Weight:  Recommend to continue to improve diet habits to improve overall feelings of well being and objective health data. Improve nutrient density of diet through increasing intake of fruits and vegetables and decreasing saturated fats, white flour products and refined sugars.   4) Healthcare Maintenance:  -Continue current medication regimen. -Follow a heart healthy diet and stay well hydrated. -BP initially elevated, BP recheck improved and wnl's. Continue ambulatory BP monitoring.  -Follow up in 4 months for HTN, HLD  Orders Placed This Encounter  Procedures  . PSA    No orders of the defined types were placed in this encounter.    Return in about 4 months (around 04/17/2021) for HTN, HLD.    Gross side effects, risk and benefits, and alternatives of medications discussed with patient.  Patient is aware that all medications have potential side effects and we are unable to predict  every side effect or drug-drug interaction that may occur.  Expresses verbal understanding and consents to current therapy plan and treatment regimen.  Please see AVS handed out to patient at the end of our visit for further patient instructions/ counseling done pertaining to today's office visit.     Subjective:        CC: CPE   HPI: DARIVS LUNDEN is a 80 y.o. male who presents to [place] at [facility] today for a yearly health maintenance exam.     Health Maintenance Summary  - Reviewed and updated, unless pt declines services.  Last Cologuard or Colonoscopy:   06/30/2015- normal, no longer required. Tobacco History Reviewed: Y, former smoker with 25 pck yr hx Abdominal Ultrasound: 03/07/2018- f/up US in 5 yrs CT scan for screening lung CA:  N/A Alcohol / drug use:    No concerns, no excessive use / no use Exercise Habits:  Daily walks 0.5 mile Dental Home: Y  Eye exams: Y Dermatology home: Y  Male history: STD concerns:   none, monogamous Additional penile/ urinary concerns: urinary frequency/urgency    Additional concerns beyond Health Maintenance issues:   none    Immunization History  Administered Date(s) Administered  . Influenza, High Dose Seasonal PF 06/20/2017, 07/17/2018, 06/10/2019  . Influenza-Unspecified 06/27/2015, 07/12/2016, 06/20/2017, 07/17/2018, 06/17/2020  . Moderna SARS-COV2 Booster Vaccination 07/27/2020  . Pneumococcal Conjugate-13 08/25/2017  . Pneumococcal Polysaccharide-23 08/01/2016  . Tdap 08/01/2016  . Zoster 09/27/2011  . Zoster Recombinat (Shingrix) 03/26/2018, 08/02/2018     Health Maintenance  Topic Date Due  .  COVID-19 Vaccine (2 - Moderna risk 4-dose series) 08/24/2020  . TETANUS/TDAP  08/01/2026  . INFLUENZA VACCINE  Completed  . PNA vac Low Risk Adult  Completed  . HPV VACCINES  Aged Out       Wt Readings from Last 3 Encounters:  12/16/20 193 lb 11.2 oz (87.9 kg)  07/08/20 190 lb 14.4 oz  (86.6 kg)  03/04/20 187 lb 4.8 oz (85 kg)   BP Readings from Last 3 Encounters:  12/16/20 124/72  07/08/20 119/73  03/04/20 130/74   Pulse Readings from Last 3 Encounters:  12/16/20 89  07/08/20 72  03/04/20 68    Patient Active Problem List   Diagnosis Date Noted  . Adrenal nodule ([place])- new onset, seen on CT scan done 09-04-19 10/11/2019  . Thyroid nodule-   R-  new onset, seen on CT scan done 09-04-19 10/11/2019  . HLD (hyperlipidemia) 08/31/2019  . Hypertension 05/22/2019  . CAD (coronary artery disease)   . Cancer ([place])   . Thrombocytopenia, unspecified ([place])   . Pharyngitis 09/04/2018  . Acute maxillary sinusitis 10/23/2017  . Injury of right elbow 05/02/2017  . Low platelet count- chronically 08/01/2016  . BPH (benign prostatic hyperplasia) 02/15/2016  . Overweight (BMI 25.0-29.9) 02/15/2016  . Spinal stenosis of lumbar region 06/04/2014    Past Medical History:  Diagnosis Date  . Acute rhinitis   . Allergic rhinitis, cause unspecified   . Allergy   . BPH (benign prostatic hyperplasia)   . CAD (coronary artery disease)   . Cancer ([place])    scalp basal cell skin ca  . Cataract    bilateral cateracts removed  . Clotting disorder ([place])   . Hypercholesteremia   . Hypotension   . Neuralgia, neuritis, and radiculitis, unspecified   . Osteoarthritis   . Spinal stenosis of lumbar region 06/04/2014  . Thrombocytopenia, unspecified ([place])     Past Surgical History:  Procedure Laterality Date  . APPENDECTOMY  1958  . bilateral cateracts removed    . CHOLECYSTECTOMY N/A    Phreesia 12/13/2020  . COLONOSCOPY    . EYE SURGERY N/A    Phreesia 12/13/2020  . HERNIA REPAIR  1965   x2    Family History  Problem Relation Age of Onset  . Stroke Mother 29  . Prostate cancer Father 62  . Heart failure Brother 40  . Lung cancer Sister   . Colon cancer Neg Hx   . Esophageal cancer Neg Hx   . Rectal cancer Neg Hx   . Stomach cancer Neg Hx     Social History    Substance and Sexual Activity  Drug Use No  ,  Social History   Substance and Sexual Activity  Alcohol Use Yes  . Alcohol/week: 1.0 standard drink  . Types: 1 Glasses of wine per week   Comment: occasional  ,  Social History   Tobacco Use  Smoking Status Former Smoker  . Packs/day: 1.00  . Years: 25.00  . Pack years: 25.00  . Types: Cigarettes  . Quit date: 07/27/1994  . Years since quitting: 26.4  Smokeless Tobacco Never Used  ,  Social History   Substance and Sexual Activity  Sexual Activity Not on file    Patient's Medications  New Prescriptions   No medications on file  Previous Medications   ASPIRIN 325 MG TABLET    Take 325 mg by mouth daily.   B COMPLEX VITAMINS TABLET    Take 1 tablet by mouth daily.  CALCIUM CARBONATE (OS-CAL) 600 MG TABS    Take 600 mg by mouth daily.   DIPHENHYDRAMINE (BENADRYL) 25 MG CAPSULE    Take 25 mg by mouth as needed (insomnia).   DOXAZOSIN (CARDURA) 2 MG TABLET    TAKE 1 TABLET BY MOUTH EVERYDAY AT BEDTIME   DOXYCYCLINE (VIBRA-TABS) 100 MG TABLET    Take 1 tablet (100 mg total) by mouth 2 (two) times daily.   FLUTICASONE (FLONASE) 50 MCG/ACT NASAL SPRAY    SPRAY 2 SPRAYS INTO EACH NOSTRIL EVERY DAY   GABAPENTIN (NEURONTIN) 300 MG CAPSULE    TAKE 1 CAPSULE BY MOUTH THREE TIMES A DAY   HYDROCORTISONE (ANUSOL-HC) 25 MG SUPPOSITORY    Place 1 suppository (25 mg total) rectally 2 (two) times daily.   MULTIPLE VITAMIN (MULTIVITAMIN) CAPSULE    Take 1 capsule by mouth daily.   VITAMIN B-12 (CYANOCOBALAMIN) 1000 MCG TABLET    Take 1,000 mcg by mouth daily.   VITAMIN D, CHOLECALCIFEROL, PO    Take 1,000 Units by mouth daily.  Modified Medications   No medications on file  Discontinued Medications   No medications on file    Patient has no known allergies.  Review of Systems: General:   Denies fever, chills, unexplained weight loss.  Optho/Auditory:   Denies visual changes, blurred vision/LOV Respiratory:   Denies SOB, DOE more  than baseline levels.   Cardiovascular:   Denies chest pain, palpitations, new onset peripheral edema  Gastrointestinal:   Denies nausea, vomiting, diarrhea.  Genitourinary: Denies dysuria, flank pain, +frequency/urgency   Endocrine:     Denies hot or cold intolerance, polyuria, polydipsia. Musculoskeletal:   Denies unexplained myalgias, joint swelling, unexplained arthralgias, gait problems.  Skin:  Denies rash, suspicious lesions Neurological:     Denies dizziness, unexplained weakness, numbness  Psychiatric/Behavioral:   Denies mood changes, suicidal or homicidal ideations, hallucinations    Objective:     Blood pressure 124/72, pulse 89, temperature 98.3 F (36.8 C), height 6' (1.829 m), weight 193 lb 11.2 oz (87.9 kg), SpO2 97 %. Body mass index is 26.27 kg/m. General Appearance:    Alert, cooperative, no distress, appears stated age  Head:    Normocephalic, without obvious abnormality, atraumatic  Eyes:    PERRL, conjunctiva/corneas clear, EOM's intact, both eyes  Ears:    Normal TM's and external ear canals, both ears  Nose:   Nares normal, septum midline, mucosa normal, no drainage    or sinus tenderness  Throat:   Lips w/o lesion, mucosa moist, and tongue normal; teeth and gums normal  Neck:   Supple, symmetrical, trachea midline, no adenopathy;    thyroid:  no enlargement/tenderness/nodules; no JVD  Back:     Symmetric, no curvature, ROM normal, no CVA tenderness  Lungs:     Clear to auscultation bilaterally, respirations unlabored, no Wh/ R/ R  Chest Wall:    No tenderness or gross deformity; normal excursion   Heart:    Regular rate and rhythm, S1 and S2 normal, no murmur, rub   or gallop  Abdomen:     Soft, non-tender, bowel sounds active all four quadrants, No G/R/R, no masses, no organomegaly  Genitalia:   Deferred by pt.  Rectal:   Deferred by pt.  Extremities:   Extremities normal, atraumatic, no cyanosis or gross edema  Pulses:   2+ and symmetric all extremities   Skin:   Warm, dry, Skin color, texture, turgor normal, no obvious rashes, ulcerated lesion on scalp (followed by Derm)  M-Sk:   Ambulates * 4 w/o difficulty, no gross deformities, tone WNL  Neurologic:   CNII-XII grossly intact Psych:  No HI/SI, judgement and insight good, Euthymic mood. Full Affect.

## 2020-12-17 LAB — PSA: Prostate Specific Ag, Serum: 4.5 ng/mL — ABNORMAL HIGH (ref 0.0–4.0)

## 2020-12-23 ENCOUNTER — Other Ambulatory Visit: Payer: Self-pay | Admitting: Physician Assistant

## 2021-01-14 ENCOUNTER — Other Ambulatory Visit: Payer: Self-pay | Admitting: Physician Assistant

## 2021-01-20 ENCOUNTER — Telehealth: Payer: Self-pay | Admitting: Physician Assistant

## 2021-01-20 ENCOUNTER — Ambulatory Visit
Admission: EM | Admit: 2021-01-20 | Discharge: 2021-01-20 | Disposition: A | Discharge status: Home / Self Care | Payer: PPO | Attending: Internal Medicine | Admitting: Internal Medicine

## 2021-01-20 ENCOUNTER — Other Ambulatory Visit: Payer: Self-pay

## 2021-01-20 DIAGNOSIS — S00452A Superficial foreign body of left ear, initial encounter: Secondary | ICD-10-CM

## 2021-01-20 NOTE — ED Triage Notes (Signed)
Patient presents with complaints of something flying into his left ear today. Reports it feels funny like something might be in there. Denies any hearing loss. This rn unable to visualize anything in ear.

## 2021-01-20 NOTE — Telephone Encounter (Signed)
Patient stated he had something that flew into his ear and we did not have an appointment available today and he was advised to go to urgent care. For documentation.

## 2021-01-20 NOTE — Discharge Instructions (Signed)
Your ear exam did not show any foreign body in your ear If you have worsening pain or difficulty hearing please return to the urgent care to be reevaluated.

## 2021-01-20 NOTE — ED Provider Notes (Signed)
[facility]    CSN: [id number] Arrival date & time: 01/20/21  1558      History   Chief Complaint Chief Complaint  Patient presents with  . Foreign Body in [place] is a 80 y.o. male comes to the urgent care with complaints of feeling of foreign body in the left ear.  Patient was doing some yard work when he felt something flying into his left ear.  He denies any ear pain.  No hearing difficulty.  No ear discharge.   HPI  Past Medical History:  Diagnosis Date  . Acute rhinitis   . Allergic rhinitis, cause unspecified   . Allergy   . BPH (benign prostatic hyperplasia)   . CAD (coronary artery disease)   . Cancer ([place])    scalp basal cell skin ca  . Cataract    bilateral cateracts removed  . Clotting disorder ([place])   . Hypercholesteremia   . Hypotension   . Neuralgia, neuritis, and radiculitis, unspecified   . Osteoarthritis   . Spinal stenosis of lumbar region 06/04/2014  . Thrombocytopenia, unspecified [facility])     Patient Active Problem List   Diagnosis Date Noted  . Adrenal nodule ([place])- new onset, seen on CT scan done 09-04-19 10/11/2019  . Thyroid nodule-   R-  new onset, seen on CT scan done 09-04-19 10/11/2019  . HLD (hyperlipidemia) 08/31/2019  . Hypertension 05/22/2019  . CAD (coronary artery disease)   . Cancer ([place])   . Thrombocytopenia, unspecified ([place])   . Pharyngitis 09/04/2018  . Acute maxillary sinusitis 10/23/2017  . Injury of right elbow 05/02/2017  . Low platelet count- chronically 08/01/2016  . BPH (benign prostatic hyperplasia) 02/15/2016  . Overweight (BMI 25.0-29.9) 02/15/2016  . Spinal stenosis of lumbar region 06/04/2014    Past Surgical History:  Procedure Laterality Date  . APPENDECTOMY  1958  . bilateral cateracts removed    . CHOLECYSTECTOMY N/A    [facility]reesia 12/13/2020  . COLONOSCOPY    . EYE SURGERY N/A    Phreesia 12/13/2020  . [place]   x2       Home Medications    Prior  to Admission medications   Medication Sig Start Date End Date Taking? Authorizing Provider  aspirin 325 MG tablet Take 325 mg by mouth daily.    [provider]  b complex vitamins tablet Take 1 tablet by mouth daily.    [provider]  calcium carbonate (OS-CAL) 600 MG TABS Take 600 mg by mouth daily.    [provider]  diphenhydrAMINE (BENADRYL) 25 mg capsule Take 25 mg by mouth as needed (insomnia).    [provider]  doxazosin (CARDURA) 2 MG tablet TAKE 1 TABLET BY MOUTH EVERYDAY AT BEDTIME 11/23/20   Abonza, Maritza, PA-C  fluticasone (FLONASE) 50 MCG/ACT nasal spray SPRAY 2 SPRAYS INTO EACH NOSTRIL EVERY DAY 01/14/21   Lorrene Reid, PA-C  gabapentin (NEURONTIN) 300 MG capsule TAKE 1 CAPSULE BY MOUTH THREE TIMES A DAY 12/23/20   Abonza, Maritza, PA-C  hydrocortisone (ANUSOL-HC) 25 MG suppository Place 1 suppository (25 mg total) rectally 2 (two) times daily. 10/30/19   Mellody Dance, DO  Multiple Vitamin (MULTIVITAMIN) capsule Take 1 capsule by mouth daily.    [provider]  vitamin B-12 (CYANOCOBALAMIN) 1000 MCG tablet Take 1,000 mcg by mouth daily.    [provider]  VITAMIN D, CHOLECALCIFEROL, PO Take 1,000 Units by mouth daily.  [provider]    Family History Family History  Problem Relation Age of Onset  . Stroke Mother 61  . Prostate cancer Father 57  . Heart failure Brother 77  . Lung cancer Sister   . Colon cancer Neg Hx   . Esophageal cancer Neg Hx   . Rectal cancer Neg Hx   . Stomach cancer Neg Hx     Social History Social History   Tobacco Use  . Smoking status: Former Smoker    Packs/day: 1.00    Years: 25.00    Pack years: 25.00    Types: Cigarettes    Quit date: 07/27/1994    Years since quitting: 26.5  . Smokeless tobacco: Never Used  Vaping Use  . Vaping Use: Never used  Substance Use Topics  . Alcohol use: Yes    Alcohol/week: 1.0 standard drink    Types: 1 Glasses of wine per  week    Comment: occasional  . Drug use: No     Allergies   Patient has no known allergies.   Review of Systems Review of Systems  HENT: Negative for ear discharge and ear pain.   Eyes: Negative.   Genitourinary: Negative.   Neurological: Negative.      Physical Exam Triage Vital Signs ED Triage Vitals [01/20/21 1650]  Enc Vitals Group     BP (!) 178/85     Pulse Rate 75     Resp 19     Temp (!) 97.5 F (36.4 C)     Temp src      SpO2 97 %     Weight      Height      Head Circumference      Peak Flow      Pain Score 0     Pain Loc      Pain Edu?      Excl. in [place]?    No data found.  Updated Vital Signs BP (!) 178/85   Pulse 75   Temp (!) 97.5 F (36.4 C)   Resp 19   SpO2 97%   Visual Acuity Right Eye Distance:   Left Eye Distance:   Bilateral Distance:    Right Eye Near:   Left Eye Near:    Bilateral Near:     Physical Exam Vitals and nursing note reviewed.  Constitutional:      General: He is not in acute distress.    Appearance: He is not ill-appearing.  HENT:     Right Ear: Tympanic membrane, ear canal and external ear normal.     Left Ear: Tympanic membrane, ear canal and external ear normal.  Pulmonary:     Effort: Pulmonary effort is normal.     Breath sounds: Normal breath sounds.  Neurological:     Mental Status: He is alert.      UC Treatments / Results  Labs (all labs ordered are listed, but only abnormal results are displayed) Labs Reviewed - No data to display  EKG   Radiology No results found.  Procedures Procedures (including critical care time)  Medications Ordered in UC Medications - No data to display  Initial Impression / Assessment and Plan / UC Course  I have reviewed the triage vital signs and the nursing notes.  Pertinent labs & imaging results that were available during my care of the patient were reviewed by me and considered in my medical decision making (see chart for details).     1.  Evaluation for possible foreign body in the left ear: No foreign body noted in the left ear Reassurance given. Final Clinical Impressions(s) / UC Diagnoses   Final diagnoses:  Foreign body in ear lobe, left, initial encounter     Discharge Instructions     Your ear exam did not show any foreign body in your ear If you have worsening pain or difficulty hearing please return to the urgent care to be reevaluated.   ED Prescriptions    None     PDMP not reviewed this encounter.   Chase Picket, MD 01/20/21 [id number]

## 2021-02-09 ENCOUNTER — Other Ambulatory Visit: Payer: Self-pay | Admitting: Physician Assistant

## 2021-03-04 ENCOUNTER — Other Ambulatory Visit: Payer: Self-pay | Admitting: Physician Assistant

## 2021-03-04 DIAGNOSIS — N400 Enlarged prostate without lower urinary tract symptoms: Secondary | ICD-10-CM

## 2021-03-05 ENCOUNTER — Other Ambulatory Visit: Payer: Self-pay | Admitting: Physician Assistant

## 2021-03-05 DIAGNOSIS — N400 Enlarged prostate without lower urinary tract symptoms: Secondary | ICD-10-CM

## 2021-03-08 ENCOUNTER — Other Ambulatory Visit: Payer: PPO

## 2021-03-08 ENCOUNTER — Other Ambulatory Visit: Payer: Self-pay

## 2021-03-08 DIAGNOSIS — N400 Enlarged prostate without lower urinary tract symptoms: Secondary | ICD-10-CM | POA: Diagnosis not present

## 2021-03-09 LAB — PSA: Prostate Specific Ag, Serum: 3.5 ng/mL (ref 0.0–4.0)

## 2021-04-09 ENCOUNTER — Telehealth: Payer: Self-pay | Admitting: Physician Assistant

## 2021-04-09 ENCOUNTER — Ambulatory Visit
Admission: EM | Admit: 2021-04-09 | Discharge: 2021-04-09 | Disposition: A | Discharge status: Home / Self Care | Payer: PPO | Attending: Urgent Care | Admitting: Urgent Care

## 2021-04-09 ENCOUNTER — Other Ambulatory Visit: Payer: Self-pay

## 2021-04-09 DIAGNOSIS — U071 COVID-19: Secondary | ICD-10-CM

## 2021-04-09 DIAGNOSIS — R059 Cough, unspecified: Secondary | ICD-10-CM

## 2021-04-09 DIAGNOSIS — R070 Pain in throat: Secondary | ICD-10-CM

## 2021-04-09 MED ORDER — CETIRIZINE HCL 10 MG PO TABS: 10.0000 mg | ORAL_TABLET | Freq: Every day | ORAL | 0 refills | Status: DC

## 2021-04-09 MED ORDER — PROMETHAZINE-DM 6.25-15 MG/5ML PO SYRP: 5.0000 mL | ORAL_SOLUTION | Freq: Every evening | ORAL | 0 refills | Status: DC | PRN

## 2021-04-09 MED ORDER — MOLNUPIRAVIR EUA 200MG CAPSULE: 4.0000 | ORAL_CAPSULE | Freq: Two times a day (BID) | ORAL | 0 refills | Status: AC

## 2021-04-09 MED ORDER — BENZONATATE 100 MG PO CAPS: 100.0000 mg | ORAL_CAPSULE | Freq: Three times a day (TID) | ORAL | 0 refills | Status: DC | PRN

## 2021-04-09 NOTE — ED Provider Notes (Signed)
[place]   MRN: [id number] DOB: November 09, 1940  Subjective:   Larry Harris is a 80 y.o. male presenting for 1 day history of runny and stuffy nose, scratchy throat, mild cough and fever.  Patient decided to take a COVID test at home and was positive.  He has his vaccination and booster.  Denies history of respiratory disorders including asthma, COPD.  He has a very remote history of smoking.  Denies any active chest pain, shortness of breath, body aches.  No current facility-administered medications for this encounter.  Current Outpatient Medications:    aspirin 325 MG tablet, Take 325 mg by mouth daily., Disp: , Rfl:    b complex vitamins tablet, Take 1 tablet by mouth daily., Disp: , Rfl:    calcium carbonate (OS-CAL) 600 MG TABS, Take 600 mg by mouth daily., Disp: , Rfl:    diphenhydrAMINE (BENADRYL) 25 mg capsule, Take 25 mg by mouth as needed (insomnia)., Disp: , Rfl:    doxazosin (CARDURA) 2 MG tablet, TAKE 1 TABLET BY MOUTH EVERYDAY AT BEDTIME, Disp: 90 tablet, Rfl: 0   fluticasone (FLONASE) 50 MCG/ACT nasal spray, SPRAY 2 SPRAYS INTO EACH NOSTRIL EVERY DAY, Disp: 16 mL, Rfl: 1   gabapentin (NEURONTIN) 300 MG capsule, TAKE 1 CAPSULE BY MOUTH THREE TIMES A DAY, Disp: 270 capsule, Rfl: 0   hydrocortisone (ANUSOL-HC) 25 MG suppository, Place 1 suppository (25 mg total) rectally 2 (two) times daily., Disp: 12 suppository, Rfl: 1   Multiple Vitamin (MULTIVITAMIN) capsule, Take 1 capsule by mouth daily., Disp: , Rfl:    vitamin B-12 (CYANOCOBALAMIN) 1000 MCG tablet, Take 1,000 mcg by mouth daily., Disp: , Rfl:    VITAMIN D, CHOLECALCIFEROL, PO, Take 1,000 Units by mouth daily., Disp: , Rfl:    No Known Allergies  Past Medical History:  Diagnosis Date   Acute rhinitis    Allergic rhinitis, cause unspecified    Allergy    BPH (benign prostatic hyperplasia)    CAD (coronary artery disease)    Cancer ([facility])    scalp basal cell skin ca   Cataract    bilateral  cateracts removed   Clotting disorder ([facility])    Hypercholesteremia    Hypotension    Neuralgia, neuritis, and radiculitis, unspecified    Osteoarthritis    Spinal stenosis of lumbar region 06/04/2014   Thrombocytopenia, unspecified ([place])      Past Surgical History:  Procedure Laterality Date   APPENDECTOMY  1958   bilateral cateracts removed     CHOLECYSTECTOMY N/A    Phreesia 12/13/2020   COLONOSCOPY     EYE SURGERY N/A    Phreesia 12/13/2020   HERNIA REPAIR  1965   x2    Family History  Problem Relation Age of Onset   Stroke Mother 64   Prostate cancer Father 68   Heart failure Brother 27   Lung cancer Sister    Colon cancer Neg Hx    Esophageal cancer Neg Hx    Rectal cancer Neg Hx    Stomach cancer Neg Hx     Social History   Tobacco Use   Smoking status: Former    Packs/day: 1.00    Years: 25.00    Pack years: 25.00    Types: Cigarettes    Quit date: 07/27/1994    Years since quitting: 26.7   Smokeless tobacco: Never  Vaping Use   Vaping Use: Never used  Substance Use Topics   Alcohol use: Yes    Alcohol/week:  1.0 standard drink    Types: 1 Glasses of wine per week    Comment: occasional   Drug use: No    ROS   Objective:   Vitals: BP (!) 145/75 (BP Location: Left Arm)   Pulse 76   Temp 98.3 F (36.8 C) (Oral)   Resp 18   SpO2 96%   Physical Exam Constitutional:      General: He is not in acute distress.    Appearance: Normal appearance. He is well-developed. He is not ill-appearing, toxic-appearing or diaphoretic.  HENT:     Head: Normocephalic and atraumatic.     Right Ear: External ear normal.     Left Ear: External ear normal.     Nose: Nose normal.     Mouth/Throat:     Mouth: Mucous membranes are moist.     Pharynx: Oropharynx is clear.  Eyes:     General: No scleral icterus.    Extraocular Movements: Extraocular movements intact.     Pupils: Pupils are equal, round, and reactive to light.  Cardiovascular:     Rate and  Rhythm: Normal rate and regular rhythm.     Heart sounds: Normal heart sounds. No murmur heard.   No friction rub. No gallop.  Pulmonary:     Effort: Pulmonary effort is normal. No respiratory distress.     Breath sounds: Normal breath sounds. No stridor. No wheezing, rhonchi or rales.  Neurological:     Mental Status: He is alert and oriented to person, place, and time.  Psychiatric:        Mood and Affect: Mood normal.        Behavior: Behavior normal.        Thought Content: Thought content normal.    Assessment and Plan :   PDMP not reviewed this encounter.  1. Clinical diagnosis of COVID-19   2. Cough   3. Throat pain     Will manage for viral illness such as viral URI, viral syndrome, viral rhinitis, COVID-19. Counseled patient on nature of COVID-19 including modes of transmission, diagnostic testing, management and supportive care.  Offered scripts for symptomatic relief. COVID 19 testing is pending.  Recommended starting molnupiravir should his COVID testing be positive.  Counseled patient on potential for adverse effects with medications prescribed/recommended today, ER and return-to-clinic precautions discussed, patient verbalized understanding.     Jaynee Eagles, PA-C 04/09/21 1043

## 2021-04-09 NOTE — Telephone Encounter (Signed)
Patient has been exposed to [place] and is experiencing a runny nose and a low grade fever. Thanks.

## 2021-04-09 NOTE — Telephone Encounter (Signed)
Suggest patient take at home covid test or go to [facility] for evaluation and treatment. AS, CMA

## 2021-04-09 NOTE — ED Triage Notes (Signed)
Pt c/o runny nose, scratchy throat, slight cough, and fever since yesterday. States had a positive home covid test.

## 2021-04-09 NOTE — Discharge Instructions (Addendum)
We will notify you of your COVID-19 test results as they arrive and may take between 24 to 48 hours.  I encourage you to sign up for MyChart if you have not already done so as this can be the easiest way for [place] to communicate results to you online or through a phone app.  In the meantime, if you develop worsening symptoms including fever, chest pain, shortness of breath despite our current treatment plan then please report to the emergency room as this may be a sign of worsening status from possible COVID-19 infection.  Please wait for the positive COVID test results before starting molnupiravir. Otherwise, we will manage this as a viral syndrome. For sore throat or cough try using a honey-based tea. Use 3 teaspoons of honey with juice squeezed from half lemon. Place shaved pieces of ginger into 1/2-1 cup of water and warm over stove top. Then mix the ingredients and repeat every 4 hours as needed. Please take Tylenol 500mg -650mg  every 6 hours for aches and pains, fevers. Hydrate very well with at least 2 liters of water. Eat light meals such as soups to replenish electrolytes and soft fruits, veggies. Start an antihistamine like Zyrtec, Allegra or Claritin for postnasal drainage, sinus congestion.

## 2021-04-14 ENCOUNTER — Ambulatory Visit: Payer: PPO | Admitting: Physician Assistant

## 2021-04-23 ENCOUNTER — Telehealth: Payer: Self-pay | Admitting: Physician Assistant

## 2021-04-23 DIAGNOSIS — R0981 Nasal congestion: Secondary | ICD-10-CM

## 2021-04-23 MED ORDER — PREDNISONE 20 MG PO TABS: ORAL_TABLET | ORAL | 0 refills | Status: DC

## 2021-04-23 NOTE — Telephone Encounter (Signed)
Patient came into the office to reschedule his missed appointment on 04/14/21 due to having COVID.  Patient is stating he still has some sinus issues and wanted to know if Herb Grays could call in a z-pak for him.  Please call patient and advise if this can be done.

## 2021-04-23 NOTE — Telephone Encounter (Signed)
Per Herb Grays advised patient we could send in steroid to help with nasal congestion but that Zpack likely not needed as this is potentially viral. Patient verbalized understanding and was agreeable. Sending Prednisone taper to pharmacy. AS, CMA

## 2021-04-27 ENCOUNTER — Other Ambulatory Visit: Payer: Self-pay

## 2021-04-27 ENCOUNTER — Ambulatory Visit (INDEPENDENT_AMBULATORY_CARE_PROVIDER_SITE_OTHER): Payer: PPO | Admitting: Physician Assistant

## 2021-04-27 ENCOUNTER — Encounter: Payer: Self-pay | Admitting: Physician Assistant

## 2021-04-27 VITALS — BP 117/68 | HR 71 | Temp 98.2°F | Ht 72.0 in | Wt 185.8 lb

## 2021-04-27 DIAGNOSIS — I10 Essential (primary) hypertension: Secondary | ICD-10-CM

## 2021-04-27 DIAGNOSIS — R0981 Nasal congestion: Secondary | ICD-10-CM

## 2021-04-27 DIAGNOSIS — U099 Post covid-19 condition, unspecified: Secondary | ICD-10-CM | POA: Diagnosis not present

## 2021-04-27 DIAGNOSIS — E785 Hyperlipidemia, unspecified: Secondary | ICD-10-CM | POA: Diagnosis not present

## 2021-04-27 NOTE — Progress Notes (Signed)
Established Patient Office Visit  Subjective:  Patient ID: Larry Harris, male    DOB: October 11, 1940  Age: 80 y.o. MRN: [id number]  CC:  Chief Complaint  Patient presents with   Follow-up    HPI SAIR KARAU presents for follow up on hypertension and hyperlipidemia. Patient was treated for Covid-19 infection 04/09/2021 and reports continues to have nasal congestion and has low energy. Patient was prescribed prednisone 4 days ago and states congestion is better. Feels more congested in the mornings.  HTN: Pt denies chest pain, palpitations, dizziness or lower extremity swelling. Taking medication as directed without side effects.   HLD: Pt is managing with lifestyle. Has not been as active due to recent Covid-19 infection.      Past Medical History:  Diagnosis Date   Acute rhinitis    Allergic rhinitis, cause unspecified    Allergy    BPH (benign prostatic hyperplasia)    CAD (coronary artery disease)    Cancer ([facility])    scalp basal cell skin ca   Cataract    bilateral cateracts removed   Clotting disorder ([facility])    Hypercholesteremia    Hypotension    Neuralgia, neuritis, and radiculitis, unspecified    Osteoarthritis    Spinal stenosis of lumbar region 06/04/2014   Thrombocytopenia, unspecified ([place])     Past Surgical History:  Procedure Laterality Date   APPENDECTOMY  1958   bilateral cateracts removed     CHOLECYSTECTOMY N/A    Phreesia 12/13/2020   COLONOSCOPY     EYE SURGERY N/A    Phreesia 12/13/2020   HERNIA REPAIR  1965   x2    Family History  Problem Relation Age of Onset   Stroke Mother 84   Prostate cancer Father 40   Heart failure Brother 83   Lung cancer Sister    Colon cancer Neg Hx    Esophageal cancer Neg Hx    Rectal cancer Neg Hx    Stomach cancer Neg Hx     Social History   Socioeconomic History   Marital status: Widowed    Spouse name: Not on file   Number of children: 2   Years of education: college 4   Highest  education level: Not on file  Occupational History   Occupation: Retired  Tobacco Use   Smoking status: Former    Packs/day: 1.00    Years: 25.00    Pack years: 25.00    Types: Cigarettes    Quit date: 07/27/1994    Years since quitting: 26.7   Smokeless tobacco: Never  Vaping Use   Vaping Use: Never used  Substance and Sexual Activity   Alcohol use: Yes    Alcohol/week: 1.0 standard drink    Types: 1 Glasses of wine per week    Comment: occasional   Drug use: No   Sexual activity: Not on file  Other Topics Concern   Not on file  Social History Narrative   The patient is widowed. 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 He has no history of alcohol use. He has previously done regular exercise until developing a back problem.      Patient drinks about 3-4 cups of coffee daily.   Patient is right handed.    Social Determinants of Health   Financial Resource Strain: Not on file  Food Insecurity: Not on file  Transportation Needs: Not on file  Physical Activity: Not on file  Stress: Not on file  Social Connections: Not on file  Intimate Partner Violence: Not on file    Outpatient Medications Prior to Visit  Medication Sig Dispense Refill   aspirin 325 MG tablet Take 325 mg by mouth daily.     b complex vitamins tablet Take 1 tablet by mouth daily.     benzonatate (TESSALON) 100 MG capsule Take 1-2 capsules (100-200 mg total) by mouth 3 (three) times daily as needed for cough. 60 capsule 0   calcium carbonate (OS-CAL) 600 MG TABS Take 600 mg by mouth daily.     cetirizine (ZYRTEC ALLERGY) 10 MG tablet Take 1 tablet (10 mg total) by mouth daily. 30 tablet 0   diphenhydrAMINE (BENADRYL) 25 mg capsule Take 25 mg by mouth as needed (insomnia).     doxazosin (CARDURA) 2 MG tablet TAKE 1 TABLET BY MOUTH EVERYDAY AT BEDTIME 90 tablet 0   fluticasone (FLONASE) 50 MCG/ACT nasal spray SPRAY 2 SPRAYS INTO EACH NOSTRIL EVERY DAY 16 mL 1   gabapentin (NEURONTIN) 300 MG  capsule TAKE 1 CAPSULE BY MOUTH THREE TIMES A DAY 270 capsule 0   hydrocortisone (ANUSOL-HC) 25 MG suppository Place 1 suppository (25 mg total) rectally 2 (two) times daily. 12 suppository 1   Multiple Vitamin (MULTIVITAMIN) capsule Take 1 capsule by mouth daily.     predniSONE (DELTASONE) 20 MG tablet Take 3 tabs by mouth x 2 days, then 2 tabs by mouth x 2 days, then 1 tab by mouth x 2 days, then 1/2 tab by mouth 2 days. 13 tablet 0   promethazine-dextromethorphan (PROMETHAZINE-DM) 6.25-15 MG/5ML syrup Take 5 mLs by mouth at bedtime as needed for cough. 100 mL 0   vitamin B-12 (CYANOCOBALAMIN) 1000 MCG tablet Take 1,000 mcg by mouth daily.     VITAMIN D, CHOLECALCIFEROL, PO Take 1,000 Units by mouth daily.     No facility-administered medications prior to visit.    No Known Allergies  ROS Review of Systems A fourteen system review of systems was performed and found to be positive as per HPI.   Objective:    Physical Exam General:  Well Developed, well nourished, appropriate for stated age.  Neuro:  Alert and oriented,  extra-ocular muscles intact  HEENT:  Normocephalic, atraumatic, neck supple Skin:  no gross rash, warm, pink. Cardiac:  RRR, S1 S2 Respiratory:  CTA B/L w/o wheezing, crackles or rales, Not using accessory muscles, speaking in full sentences- unlabored. Vascular:  Ext warm, no cyanosis apprec.; cap RF less 2 sec. Psych:  No HI/SI, judgement and insight good, Euthymic mood. Full Affect.  BP 117/68   Pulse 71   Temp 98.2 F (36.8 C)   Ht 6' (1.829 m)   Wt 185 lb 12.8 oz (84.3 kg)   SpO2 99%   BMI 25.20 kg/m  Wt Readings from Last 3 Encounters:  04/27/21 185 lb 12.8 oz (84.3 kg)  12/16/20 193 lb 11.2 oz (87.9 kg)  07/08/20 190 lb 14.4 oz (86.6 kg)     Health Maintenance Due  Topic Date Due   COVID-19 Vaccine (2 - Moderna risk series) 08/24/2020   INFLUENZA VACCINE  04/26/2021    There are no preventive care reminders to display for this patient.  Lab  Results  Component Value Date   TSH 3.320 10/05/2020   Lab Results  Component Value Date   WBC 7.5 10/05/2020   HGB 15.0 10/05/2020   HCT 45.6 10/05/2020   MCV 96 10/05/2020   PLT 261 10/05/2020   Lab Results  Component Value Date   NA 141 10/05/2020   K 4.2 10/05/2020   CO2 26 10/05/2020   GLUCOSE 85 10/05/2020   BUN 18 10/05/2020   CREATININE 1.01 10/05/2020   BILITOT 0.6 10/05/2020   ALKPHOS 58 10/05/2020   AST 12 10/05/2020   ALT 12 10/05/2020   PROT 5.1 (L) 10/05/2020   ALBUMIN 3.3 (L) 10/05/2020   CALCIUM 8.6 10/05/2020   ANIONGAP 7 09/04/2019   Lab Results  Component Value Date   CHOL 158 10/05/2020   Lab Results  Component Value Date   HDL 47 10/05/2020   Lab Results  Component Value Date   LDLCALC 96 10/05/2020   Lab Results  Component Value Date   TRIG 80 10/05/2020   Lab Results  Component Value Date   CHOLHDL 3.4 10/05/2020   Lab Results  Component Value Date   HGBA1C 5.6 10/05/2020      Assessment & Plan:   Problem List Items Addressed This Visit       Cardiovascular and Mediastinum   Hypertension - Primary     Other   HLD (hyperlipidemia) (Chronic)   Other Visit Diagnoses     Post-COVID-19 condition       Nasal sinus congestion          Hypertension: -Controlled. Continue current medication regimen. -Will continue to monitor.  Hyperlipidemia: -Last lipid panel: total cholesterol 158, triglycerides 80, HDL 47, LDL 96 (improved from 115) -Recommend to continue with low fat diet and resume physical activity as tolerated. -Will repeat lipid panel with CPE.  Post-Covid condition, Nasal sinus congestion: -Reassurance provided.  -Recommend to complete corticosteroid therapy and use nasal spray (Flonase) BID and if symptoms fail to improve or worsen then will consider antibiotic therapy. Patient verbalized understanding.   No orders of the defined types were placed in this encounter.   Follow-up: Return for CPE and FBW 1  wk prior in March 2023.    Lorrene Reid, PA-C

## 2021-05-20 ENCOUNTER — Other Ambulatory Visit: Payer: Self-pay | Admitting: Physician Assistant

## 2021-05-20 ENCOUNTER — Other Ambulatory Visit: Payer: Self-pay | Admitting: Family Medicine

## 2021-05-20 DIAGNOSIS — K649 Unspecified hemorrhoids: Secondary | ICD-10-CM

## 2021-05-22 ENCOUNTER — Other Ambulatory Visit: Payer: Self-pay | Admitting: Physician Assistant

## 2021-05-24 ENCOUNTER — Telehealth: Payer: Self-pay | Admitting: Physician Assistant

## 2021-05-24 DIAGNOSIS — K649 Unspecified hemorrhoids: Secondary | ICD-10-CM

## 2021-05-24 MED ORDER — HYDROCORTISONE ACETATE 25 MG RE SUPP: 25.0000 mg | Freq: Two times a day (BID) | RECTAL | 0 refills | Status: DC

## 2021-05-24 NOTE — Telephone Encounter (Signed)
Patient requesting refill of Hydrocortisone 25 MG suppository. Last prescribed on02/3/21 by [place]. AS, CMA

## 2021-05-24 NOTE — Addendum Note (Signed)
Addended by: Mickel Crow on: 05/24/2021 12:52 PM   Modules accepted: Orders

## 2021-05-24 NOTE — Telephone Encounter (Signed)
Rx sent to requested pharmacy. AS, CMA 

## 2021-05-24 NOTE — Telephone Encounter (Signed)
Patient would like a refill on hydrocortisone and uses [facility] on [address]. Thanks

## 2021-05-29 ENCOUNTER — Other Ambulatory Visit: Payer: Self-pay | Admitting: Physician Assistant

## 2021-05-29 DIAGNOSIS — N400 Enlarged prostate without lower urinary tract symptoms: Secondary | ICD-10-CM

## 2021-06-08 DIAGNOSIS — L570 Actinic keratosis: Secondary | ICD-10-CM | POA: Diagnosis not present

## 2021-06-08 DIAGNOSIS — D225 Melanocytic nevi of trunk: Secondary | ICD-10-CM | POA: Diagnosis not present

## 2021-06-08 DIAGNOSIS — Z08 Encounter for follow-up examination after completed treatment for malignant neoplasm: Secondary | ICD-10-CM | POA: Diagnosis not present

## 2021-06-08 DIAGNOSIS — L821 Other seborrheic keratosis: Secondary | ICD-10-CM | POA: Diagnosis not present

## 2021-06-08 DIAGNOSIS — L578 Other skin changes due to chronic exposure to nonionizing radiation: Secondary | ICD-10-CM | POA: Diagnosis not present

## 2021-06-08 DIAGNOSIS — Z85828 Personal history of other malignant neoplasm of skin: Secondary | ICD-10-CM | POA: Diagnosis not present

## 2021-06-16 ENCOUNTER — Other Ambulatory Visit: Payer: Self-pay | Admitting: Physician Assistant

## 2021-07-09 ENCOUNTER — Other Ambulatory Visit: Payer: Self-pay

## 2021-07-09 ENCOUNTER — Ambulatory Visit (INDEPENDENT_AMBULATORY_CARE_PROVIDER_SITE_OTHER): Payer: PPO | Admitting: Nurse Practitioner

## 2021-07-09 ENCOUNTER — Encounter: Payer: Self-pay | Admitting: Nurse Practitioner

## 2021-07-09 ENCOUNTER — Ambulatory Visit
Admission: RE | Admit: 2021-07-09 | Discharge: 2021-07-09 | Disposition: A | Discharge status: Home / Self Care | Payer: PPO | Source: Ambulatory Visit | Attending: Nurse Practitioner | Admitting: Nurse Practitioner

## 2021-07-09 VITALS — BP 147/80 | HR 72 | Temp 97.8°F | Ht 72.0 in | Wt 189.2 lb

## 2021-07-09 DIAGNOSIS — M79604 Pain in right leg: Secondary | ICD-10-CM | POA: Diagnosis not present

## 2021-07-09 DIAGNOSIS — S8011XA Contusion of right lower leg, initial encounter: Secondary | ICD-10-CM | POA: Diagnosis not present

## 2021-07-09 DIAGNOSIS — R600 Localized edema: Secondary | ICD-10-CM | POA: Insufficient documentation

## 2021-07-09 DIAGNOSIS — M7989 Other specified soft tissue disorders: Secondary | ICD-10-CM | POA: Diagnosis not present

## 2021-07-09 NOTE — Progress Notes (Signed)
Acute Office Visit  Subjective:    Patient ID: Larry Harris, male    DOB: 04-06-1941, 80 y.o.   MRN: [id number]  Chief Complaint  Patient presents with   Leg Pain    The patient states that he first noted a tender bump on the lower right leg the day after he mowed his brother's lawn with an Programme researcher, broadcasting/film/video. He denies known injury or trauma. States that as time has progressed, he has noted that bruising and redness have travelled downward into the ankle and foot. He has a line of purplish discoloration around the ankle and bottom of the right foot. It is swollen and feels more tight than tender. He denies shortness of breath, chest pain, or chest pressure.  Leg Pain  The incident occurred 5 to 7 days ago. The incident occurred at home (mowing brother's lawn). The injury mechanism was a compression (had tender spot on right lower leg after mowing his brother's lawn on unfamiliar lawnmower.). The pain is present in the right leg, right ankle and right foot. The pain is mild. Associated symptoms include tingling. He reports no foreign bodies present. Nothing aggravates the symptoms. He has tried nothing for the symptoms.    Past Medical History:  Diagnosis Date   Acute rhinitis    Allergic rhinitis, cause unspecified    Allergy    BPH (benign prostatic hyperplasia)    CAD (coronary artery disease)    Cancer (HCC)    scalp basal cell skin ca   Cataract    bilateral cateracts removed   Clotting disorder (HCC)    Hypercholesteremia    Hypotension    Neuralgia, neuritis, and radiculitis, unspecified    Osteoarthritis    Spinal stenosis of lumbar region 06/04/2014   Thrombocytopenia, unspecified ([place])     Past Surgical History:  Procedure Laterality Date   APPENDECTOMY  1958   bilateral cateracts removed     CHOLECYSTECTOMY N/A    [facility] 12/13/2020   COLONOSCOPY     EYE SURGERY N/A    [facility] 12/13/2020   HERNIA REPAIR  1965   x2    Family History  Problem  Relation Age of Onset   Stroke Mother 42   Prostate cancer Father 88   Heart failure Brother 96   Lung cancer Sister    Colon cancer Neg Hx    Esophageal cancer Neg Hx    Rectal cancer Neg Hx    Stomach cancer Neg Hx     Social History   Socioeconomic History   Marital status: Widowed    Spouse name: Not on file   Number of children: 2   Years of education: college 4   Highest education level: Not on file  Occupational History   Occupation: Retired  Tobacco Use   Smoking status: Former    Packs/day: 1.00    Years: 25.00    Pack years: 25.00    Types: Cigarettes    Quit date: 07/27/1994    Years since quitting: 26.9   Smokeless tobacco: Never  Vaping Use   Vaping Use: Never used  Substance and Sexual Activity   Alcohol use: Yes    Alcohol/week: 1.0 standard drink    Types: 1 Glasses of wine per week    Comment: occasional   Drug use: No   Sexual activity: Not on file  Other Topics Concern   Not on file  Social History Narrative   The patient is widowed. 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 He has no history of alcohol use. He has previously done regular exercise until developing a back problem.      Patient drinks about 3-4 cups of coffee daily.   Patient is right handed.    Social Determinants of Health   Financial Resource Strain: Not on file  Food Insecurity: Not on file  Transportation Needs: Not on file  Physical Activity: Not on file  Stress: Not on file  Social Connections: Not on file  Intimate Partner Violence: Not on file    Outpatient Medications Prior to Visit  Medication Sig Dispense Refill   aspirin 325 MG tablet Take 325 mg by mouth daily.     b complex vitamins tablet Take 1 tablet by mouth daily.     calcium carbonate (OS-CAL) 600 MG TABS Take 600 mg by mouth daily.     diphenhydrAMINE (BENADRYL) 25 mg capsule Take 25 mg by mouth as needed (insomnia).     doxazosin (CARDURA) 2 MG tablet TAKE 1 TABLET BY MOUTH EVERYDAY AT  BEDTIME 90 tablet 0   fluticasone (FLONASE) 50 MCG/ACT nasal spray SPRAY 2 SPRAYS INTO EACH NOSTRIL EVERY DAY 16 mL 1   gabapentin (NEURONTIN) 300 MG capsule TAKE 1 CAPSULE BY MOUTH THREE TIMES A DAY 270 capsule 0   hydrocortisone (ANUSOL-HC) 25 MG suppository Place 1 suppository (25 mg total) rectally 2 (two) times daily. 12 suppository 0   Multiple Vitamin (MULTIVITAMIN) capsule Take 1 capsule by mouth daily.     promethazine-dextromethorphan (PROMETHAZINE-DM) 6.25-15 MG/5ML syrup Take 5 mLs by mouth at bedtime as needed for cough. 100 mL 0   vitamin B-12 (CYANOCOBALAMIN) 1000 MCG tablet Take 1,000 mcg by mouth daily.     VITAMIN D, CHOLECALCIFEROL, PO Take 1,000 Units by mouth daily.     benzonatate (TESSALON) 100 MG capsule Take 1-2 capsules (100-200 mg total) by mouth 3 (three) times daily as needed for cough. 60 capsule 0   cetirizine (ZYRTEC ALLERGY) 10 MG tablet Take 1 tablet (10 mg total) by mouth daily. 30 tablet 0   predniSONE (DELTASONE) 20 MG tablet Take 3 tabs by mouth x 2 days, then 2 tabs by mouth x 2 days, then 1 tab by mouth x 2 days, then 1/2 tab by mouth 2 days. 13 tablet 0   No facility-administered medications prior to visit.    No Known Allergies  Review of Systems  Constitutional:  Negative for activity change, chills, fatigue and fever.  HENT:  Negative for congestion, postnasal drip, rhinorrhea, sinus pressure, sinus pain, sneezing and sore throat.   Eyes: Negative.   Respiratory:  Negative for cough, shortness of breath and wheezing.   Cardiovascular:  Negative for chest pain and palpitations.  Gastrointestinal:  Negative for constipation, diarrhea, nausea and vomiting.  Endocrine: Negative for cold intolerance, heat intolerance, polydipsia and polyuria.  Genitourinary:  Negative for dysuria, frequency and urgency.  Musculoskeletal:  Positive for arthralgias. Negative for back pain and myalgias.       Swelling present of the right lower leg, foot, and and ankle.  Feels tight rather than tender or sore.   Skin:  Negative for rash.       Bruising to the right lower leg, ankle, and foot.   Allergic/Immunologic: Negative for environmental allergies.  Neurological:  Positive for tingling. Negative for dizziness, weakness and headaches.  Psychiatric/Behavioral:  The patient is not nervous/anxious.       Objective:    Physical Exam Vitals and  nursing note reviewed.  Constitutional:      Appearance: Normal appearance. He is well-developed.  HENT:     Head: Normocephalic and atraumatic.  Eyes:     Pupils: Pupils are equal, round, and reactive to light.  Cardiovascular:     Rate and Rhythm: Normal rate and regular rhythm.     Pulses:          Dorsalis pedis pulses are 1+ on the right side.     Heart sounds: Normal heart sounds.     Comments: Mild, non-pitting edema of the right lower extremity.  Pulmonary:     Effort: Pulmonary effort is normal.     Breath sounds: Normal breath sounds.  Abdominal:     Palpations: Abdomen is soft.  Musculoskeletal:        General: Normal range of motion.     Cervical back: Normal range of motion and neck supple.     Right foot: Normal range of motion. No deformity or bunion.       Feet:     Comments: Hematoma type swelling can be felt when palpating the lateral shin area, midway up the right lower leg.   Feet:     Right foot:     Skin integrity: Skin integrity normal.  Lymphadenopathy:     Cervical: No cervical adenopathy.  Skin:    General: Skin is warm and dry.     Capillary Refill: Capillary refill takes less than 2 seconds.  Neurological:     General: No focal deficit present.     Mental Status: He is alert and oriented to person, place, and time.  Psychiatric:        Mood and Affect: Mood normal.        Behavior: Behavior normal.        Thought Content: Thought content normal.        Judgment: Judgment normal.   Today's Vitals   07/09/21 1110  BP: (!) 147/80  Pulse: 72  Temp: 97.8 F (36.6  C)  SpO2: 98%  Weight: 189 lb 3.2 oz (85.8 kg)  Height: 6' (1.829 m)   Body mass index is 25.66 kg/m.  Wt Readings from Last 3 Encounters:  07/09/21 189 lb 3.2 oz (85.8 kg)  04/27/21 185 lb 12.8 oz (84.3 kg)  12/16/20 193 lb 11.2 oz (87.9 kg)    Health Maintenance Due  Topic Date Due   COVID-19 Vaccine (2 - Moderna risk series) 08/24/2020   INFLUENZA VACCINE  04/26/2021    There are no preventive care reminders to display for this patient.   Lab Results  Component Value Date   TSH 3.320 10/05/2020   Lab Results  Component Value Date   WBC 7.5 10/05/2020   HGB 15.0 10/05/2020   HCT 45.6 10/05/2020   MCV 96 10/05/2020   PLT 261 10/05/2020   Lab Results  Component Value Date   NA 141 10/05/2020   K 4.2 10/05/2020   CO2 26 10/05/2020   GLUCOSE 85 10/05/2020   BUN 18 10/05/2020   CREATININE 1.01 10/05/2020   BILITOT 0.6 10/05/2020   ALKPHOS 58 10/05/2020   AST 12 10/05/2020   ALT 12 10/05/2020   PROT 5.1 (L) 10/05/2020   ALBUMIN 3.3 (L) 10/05/2020   CALCIUM 8.6 10/05/2020   ANIONGAP 7 09/04/2019   Lab Results  Component Value Date   CHOL 158 10/05/2020   Lab Results  Component Value Date   HDL 47 10/05/2020   Lab Results  Component Value Date   LDLCALC 96 10/05/2020   Lab Results  Component Value Date   TRIG 80 10/05/2020   Lab Results  Component Value Date   CHOLHDL 3.4 10/05/2020   Lab Results  Component Value Date   HGBA1C 5.6 10/05/2020       Assessment & Plan:  1. Edema of right lower extremity Consider hematoma vs dvt of right lower extremity. Will get stat ultrasound of right lower extremity for further evaluation. Will treat as indicated. Advised patient to rest and elevate the right leg when possible.  - US Venous Img Lower Unilateral Right; Future  2. Pain of right lower extremity Consider hematoma vs dvt of right lower extremity. Will get stat ultrasound of right lower extremity for further evaluation. Will treat as indicated.  Advised patient to rest and elevate the right leg when possible.  - US Venous Img Lower Unilateral Right; Future   Problem List Items Addressed This Visit       Other   Edema of right lower extremity - Primary   Relevant Orders   US Venous Img Lower Unilateral Right   Pain of right lower extremity   Relevant Orders   US Venous Img Lower Unilateral Right      Ronnell Freshwater, NP

## 2021-07-11 NOTE — Progress Notes (Signed)
Left message for patient to call back. Negative for DVT. Normal flow in all veins. Will advise patient to monitor condition. Should gradually improve with rest and elevation. May consider low heat to improve any discomfort.

## 2021-07-12 ENCOUNTER — Other Ambulatory Visit: Payer: Self-pay | Admitting: Physician Assistant

## 2021-07-26 ENCOUNTER — Other Ambulatory Visit: Payer: Self-pay | Admitting: Physician Assistant

## 2021-08-26 ENCOUNTER — Other Ambulatory Visit: Payer: Self-pay | Admitting: Physician Assistant

## 2021-08-26 DIAGNOSIS — N400 Enlarged prostate without lower urinary tract symptoms: Secondary | ICD-10-CM

## 2021-09-15 IMAGING — CT CT ANGIO CHEST-ABD-PELV FOR DISSECTION W/ AND WO/W CM
2 of 6 series · 11 of 36 positions shown, 15 images · IV contrast (OMNI)
Comparison: None.

CLINICAL DATA: Acute left neck, shoulder, chest and back pain since
yesterday with elevated blood pressure. Aortic dissection suspected.

EXAM:
CT ANGIOGRAPHY CHEST, ABDOMEN AND PELVIS
TECHNIQUE: Multidetector CT imaging through the chest, abdomen and pelvis was
performed using the standard protocol during bolus administration of
intravenous contrast. Multiplanar reconstructed images and MIPs were
obtained and reviewed to evaluate the vascular anatomy.
CONTRAST:  100mL OMNIPAQUE IOHEXOL 350 MG/ML SOLN

[Series 16: dissection 3.0 i30f 3 · axial · 0.77mm/px · z∈[-851,-281]mm · 10 of 222 slices shown, 13 images]
[im 21/222  mediastinal]
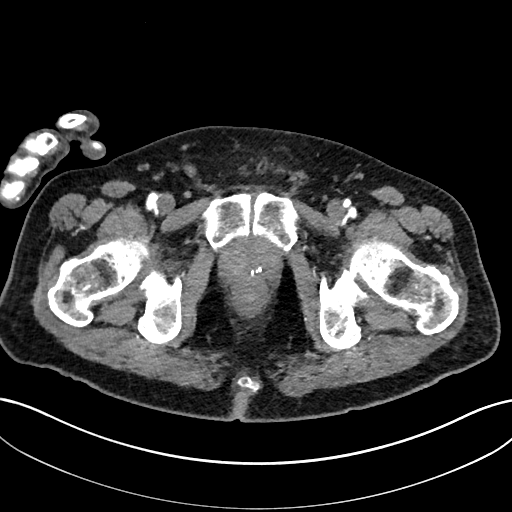
[im 21/222  bone]
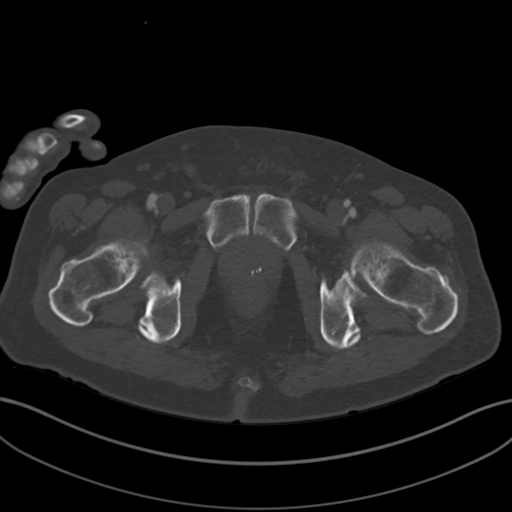
[im 41/222  mediastinal]
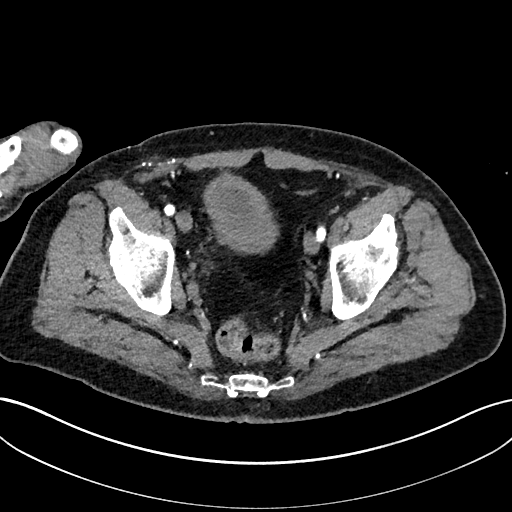
[im 71/222  mediastinal]
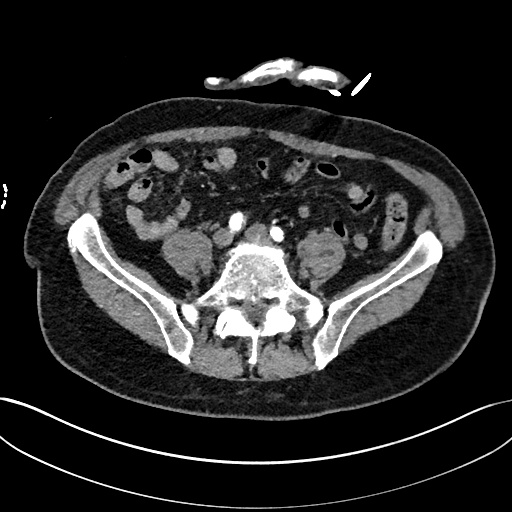
[im 101/222  mediastinal]
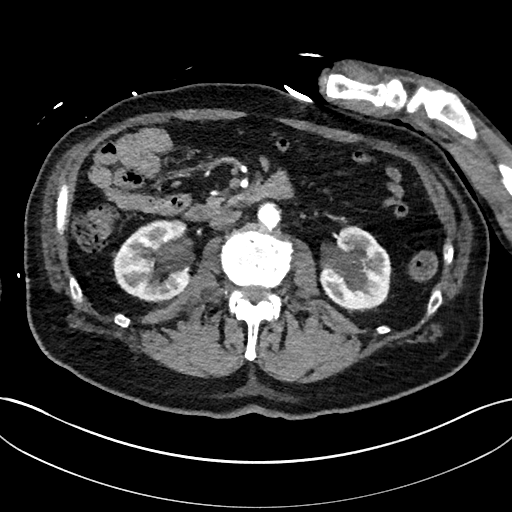
[im 121/222  mediastinal]
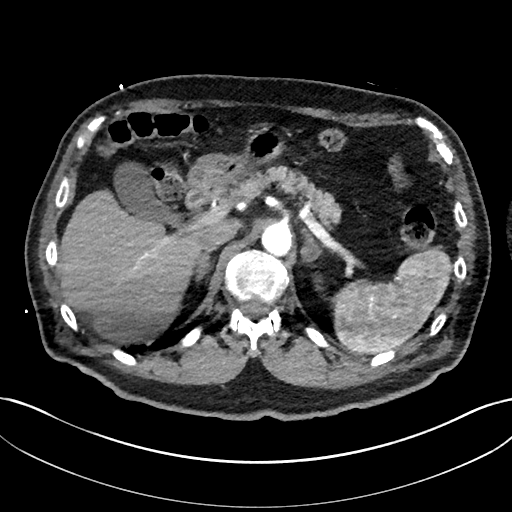
[im 151/222  mediastinal]
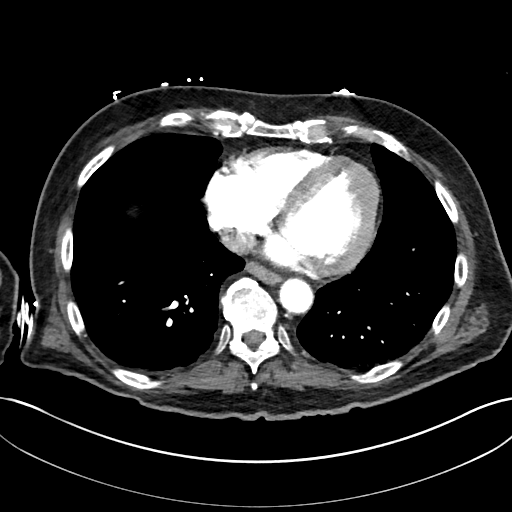
[im 181/222  mediastinal]
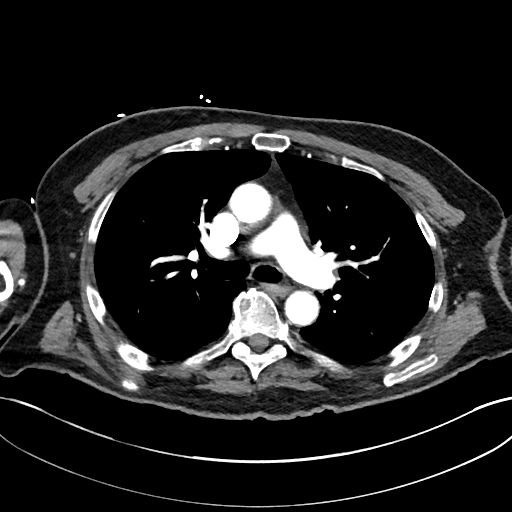
[im 181/222  lung]
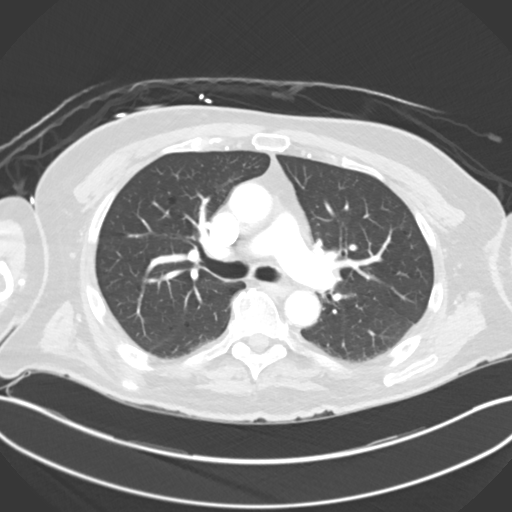
[im 191/222  lung]
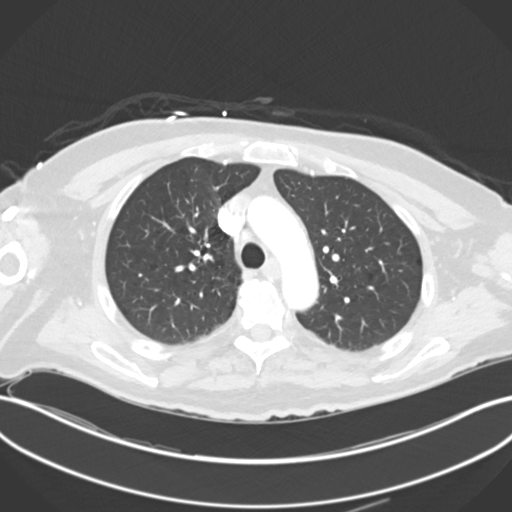
[im 201/222  mediastinal]
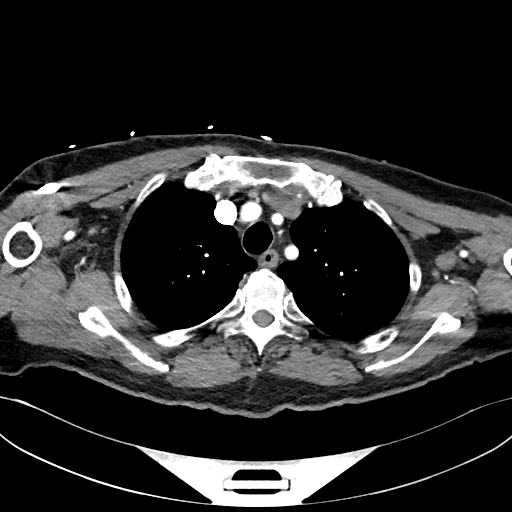
[im 201/222  lung]
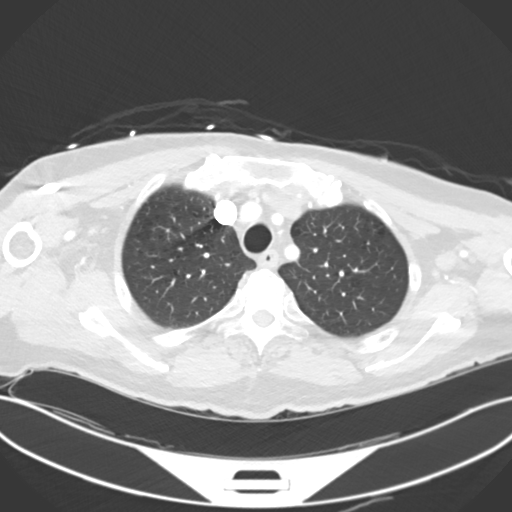
[im 211/222  lung]
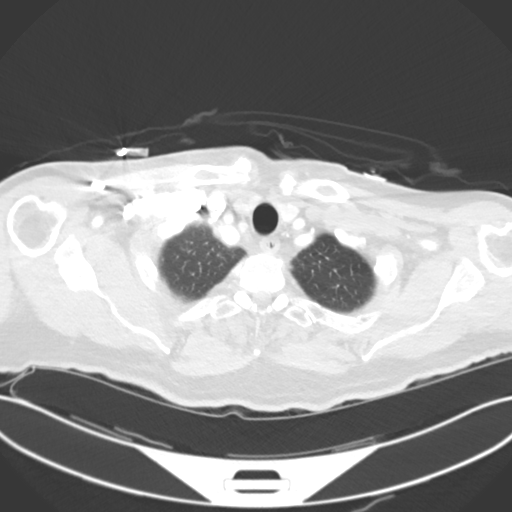

[Series 19: coronals · coronal · 0.86mm/px · 1 of 151 slices shown, 2 images]
[im 76/151  mediastinal]
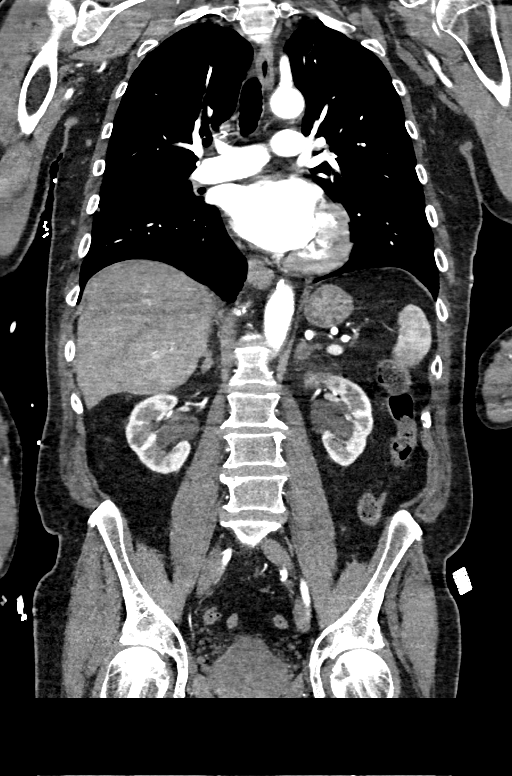
[im 76/151  bone]
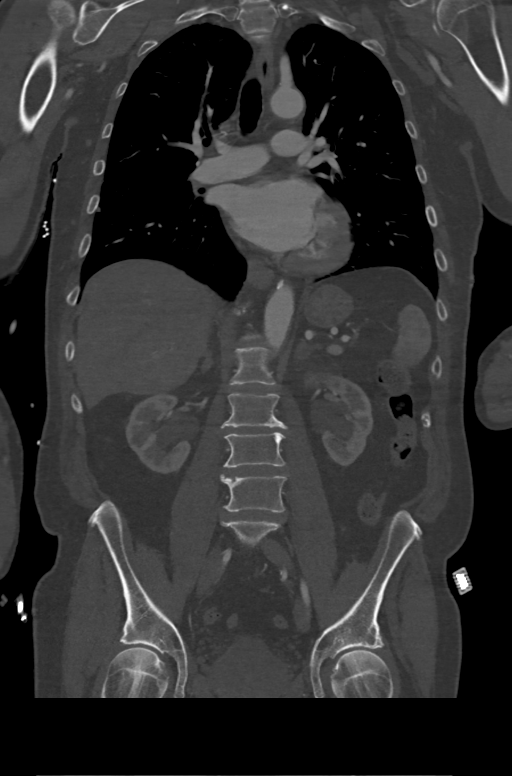

[11 of 36 positions shown; findings below may reference images not displayed]

FINDINGS: CTA CHEST FINDINGS

Cardiovascular: Pre contrast images demonstrate no displaced intimal
calcifications. Following contrast, the aorta opacifies normally.
There is no evidence of aortic dissection or aneurysm. The pulmonary
arteries are well opacified with contrast. There is aortic and great
vessel atherosclerosis. The left vertebral artery arises directly
from the aortic arch. The heart size is normal. There is no
pericardial effusion.

Mediastinum/Nodes: There are no enlarged mediastinal, hilar or
axillary lymph nodes.No evidence of mediastinal hematoma. Projecting
posteriorly from the right thyroid lobe is a partially calcified
nodule which measures 2.1 x 1.4 cm on image [DATE]. This enhances
following contrast. The trachea and esophagus demonstrate no
significant findings.

Lungs/Pleura: There is no pleural effusion or pneumothorax. Mild
centrilobular emphysema and dependent atelectasis in both lungs. No
suspicious pulmonary nodularity.

Musculoskeletal/Chest wall: There is no chest wall mass or
suspicious osseous finding.

Review of the MIP images confirms the above findings.

CTA ABDOMEN AND PELVIS FINDINGS

VASCULAR

Aorta: Moderate atherosclerosis without evidence of aneurysm or
dissection.

Celiac: Atherosclerosis without aneurysm or large vessel occlusion.
The right common hepatic artery arises separately from the aorta.

SMA: Widely patent.

Renals: Widely patent. There are accessory renal arteries
bilaterally.

IMA: Patent.

Inflow: Mild atherosclerosis without aneurysm or dissection.
Postsurgical changes in the right groin.

Veins: Suboptimally opacified. No apparent abnormality.

Review of the MIP images confirms the above findings.

NON-VASCULAR

Hepatobiliary: Small cyst in the left hepatic lobe. No suspicious
hepatic lesion or abnormal enhancement. There is no evidence of
gallstones, gallbladder wall thickening or biliary dilatation.

Pancreas: No evidence of pancreatic mass, ductal dilatation or
surrounding inflammation.

Spleen: Normal in size without focal abnormality.

Adrenals/Urinary Tract: There are small bilateral adrenal nodules
measuring 10 mm on the right (103/16) and 17 mm on the left (image
102/16). These have a nonspecific attenuation. Probable bilateral
extrarenal pelves without perinephric soft tissue stranding or
urinary tract calculus. Mild diffuse bladder wall thickening.

Stomach/Bowel: No enteric contrast was administered. There is no
bowel wall thickening, significant distention or surrounding
inflammation.

Lymphatic: No enlarged abdominopelvic lymph nodes. No
retroperitoneal hematoma.

Reproductive: The prostate gland is markedly enlarged and
heterogeneous. No focal mass lesion apparent.

Other: Postsurgical changes in the right inguinal region. No
ascites.

Musculoskeletal: No evidence of acute fracture or focal osseous
lesion. Mild lumbar spondylosis.

Review of the MIP images confirms the above findings.
IMPRESSION: 1. No evidence of aortic dissection or other acute vascular
findings.
2. Atherosclerosis and vascular anatomic variants as described
without significant stenosis.
3. Enhancing 2.1 x 1.4 cm exophytic right thyroid nodule. Recommend
thyroid US.(Ref: [HOSPITAL]. [DATE]): 143-50).
4. Indeterminate bilateral adrenal nodules, statistically adenomas.
Consider noncontrast follow-up abdominal CT in 6-12 months to better
characterize.
5. Prostatomegaly with mild diffuse bladder wall thickening.
6. Aortic Atherosclerosis (X30BI-B5C.C) and Emphysema (X30BI-PEP.I).

## 2021-10-01 ENCOUNTER — Other Ambulatory Visit: Payer: Self-pay

## 2021-10-01 ENCOUNTER — Ambulatory Visit (INDEPENDENT_AMBULATORY_CARE_PROVIDER_SITE_OTHER): Payer: PPO | Admitting: Nurse Practitioner

## 2021-10-01 VITALS — BP 140/70 | HR 80 | Temp 98.0°F | Ht 72.0 in | Wt 189.0 lb

## 2021-10-01 DIAGNOSIS — J0140 Acute pansinusitis, unspecified: Secondary | ICD-10-CM | POA: Diagnosis not present

## 2021-10-01 LAB — POCT INFLUENZA A/B: Influenza A, POC: NEGATIVE

## 2021-10-01 MED ORDER — AZITHROMYCIN 250 MG PO TABS: ORAL_TABLET | ORAL | 0 refills | Status: DC

## 2021-10-01 NOTE — Progress Notes (Signed)
Acute Office Visit  Subjective:    Patient ID: Larry Harris, male    DOB: 06/08/1941, 81 y.o.   MRN: [id number]  Chief Complaint  Patient presents with   URI    The patient did take a home test for COVID 19 when symptoms started and it was negative.   URI  This is a new problem. The current episode started in the past 7 days. The problem has been waxing and waning. Maximum temperature: has had some cold sweats for the past several days. Associated symptoms include congestion, coughing, headaches, rhinorrhea, sinus pain and a sore throat. Pertinent negatives include no chest pain, diarrhea, dysuria, ear pain, joint pain, joint swelling, nausea, neck pain, plugged ear sensation, rash, sneezing, vomiting or wheezing. He has tried decongestant for the symptoms. The treatment provided no relief.    Past Medical History:  Diagnosis Date   Acute rhinitis    Allergic rhinitis, cause unspecified    Allergy    BPH (benign prostatic hyperplasia)    CAD (coronary artery disease)    Cancer (HCC)    scalp basal cell skin ca   Cataract    bilateral cateracts removed   Clotting disorder (HCC)    Hypercholesteremia    Hypotension    Neuralgia, neuritis, and radiculitis, unspecified    Osteoarthritis    Spinal stenosis of lumbar region 06/04/2014   Thrombocytopenia, unspecified ([place])     Past Surgical History:  Procedure Laterality Date   APPENDECTOMY  1958   bilateral cateracts removed     CHOLECYSTECTOMY N/A    [facility] 12/13/2020   COLONOSCOPY     EYE SURGERY N/A    [facility] 12/13/2020   HERNIA REPAIR  1965   x2    Family History  Problem Relation Age of Onset   Stroke Mother 95   Prostate cancer Father 74   Heart failure Brother 51   Lung cancer Sister    Colon cancer Neg Hx    Esophageal cancer Neg Hx    Rectal cancer Neg Hx    Stomach cancer Neg Hx     Social History   Socioeconomic History   Marital status: Widowed    Spouse name: Not on file   Number of  children: 2   Years of education: college 4   Highest education level: Not on file  Occupational History   Occupation: Retired  Tobacco Use   Smoking status: Former    Packs/day: 1.00    Years: 25.00    Pack years: 25.00    Types: Cigarettes    Quit date: 07/27/1994    Years since quitting: 27.2   Smokeless tobacco: Never  Vaping Use   Vaping Use: Never used  Substance and Sexual Activity   Alcohol use: Yes    Alcohol/week: 1.0 standard drink    Types: 1 Glasses of wine per week    Comment: occasional   Drug use: No   Sexual activity: Not on file  Other Topics Concern   Not on file  Social History Narrative   The patient is widowed. 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 He has no history of alcohol use. He has previously done regular exercise until developing a back problem.      Patient drinks about 3-4 cups of coffee daily.   Patient is right handed.    Social Determinants of Health   Financial Resource Strain: Not on file  Food Insecurity: Not on file  Transportation  Needs: Not on file  Physical Activity: Not on file  Stress: Not on file  Social Connections: Not on file  Intimate Partner Violence: Not on file    Outpatient Medications Prior to Visit  Medication Sig Dispense Refill   aspirin 325 MG tablet Take 325 mg by mouth daily.     b complex vitamins tablet Take 1 tablet by mouth daily.     calcium carbonate (OS-CAL) 600 MG TABS Take 600 mg by mouth daily.     diphenhydrAMINE (BENADRYL) 25 mg capsule Take 25 mg by mouth as needed (insomnia).     doxazosin (CARDURA) 2 MG tablet TAKE 1 TABLET BY MOUTH EVERYDAY AT BEDTIME 90 tablet 0   fluticasone (FLONASE) 50 MCG/ACT nasal spray SPRAY 2 SPRAYS INTO EACH NOSTRIL EVERY DAY 48 mL 1   gabapentin (NEURONTIN) 300 MG capsule TAKE 1 CAPSULE BY MOUTH THREE TIMES A DAY 270 capsule 0   hydrocortisone (ANUSOL-HC) 25 MG suppository Place 1 suppository (25 mg total) rectally 2 (two) times daily. 12 suppository 0    Multiple Vitamin (MULTIVITAMIN) capsule Take 1 capsule by mouth daily.     promethazine-dextromethorphan (PROMETHAZINE-DM) 6.25-15 MG/5ML syrup Take 5 mLs by mouth at bedtime as needed for cough. 100 mL 0   vitamin B-12 (CYANOCOBALAMIN) 1000 MCG tablet Take 1,000 mcg by mouth daily.     VITAMIN D, CHOLECALCIFEROL, PO Take 1,000 Units by mouth daily.     No facility-administered medications prior to visit.    No Known Allergies  Review of Systems  Constitutional:  Positive for fatigue. Negative for activity change, chills and fever.  HENT:  Positive for congestion, postnasal drip, rhinorrhea, sinus pressure, sinus pain and sore throat. Negative for ear pain and sneezing.   Eyes: Negative.   Respiratory:  Positive for cough. Negative for shortness of breath and wheezing.   Cardiovascular:  Negative for chest pain and palpitations.  Gastrointestinal:  Negative for constipation, diarrhea, nausea and vomiting.  Endocrine: Negative for cold intolerance, heat intolerance, polydipsia and polyuria.  Genitourinary:  Negative for dysuria, frequency and urgency.  Musculoskeletal:  Negative for back pain, joint pain, myalgias and neck pain.  Skin:  Negative for rash.  Allergic/Immunologic: Positive for environmental allergies.  Neurological:  Positive for headaches. Negative for dizziness and weakness.  Psychiatric/Behavioral:  The patient is not nervous/anxious.       Objective:    Physical Exam Vitals and nursing note reviewed.  Constitutional:      Appearance: Normal appearance. He is well-developed. He is ill-appearing.  HENT:     Head: Normocephalic and atraumatic.     Right Ear: Tympanic membrane, ear canal and external ear normal.     Left Ear: Tympanic membrane, ear canal and external ear normal.     Nose: Congestion and rhinorrhea present.     Mouth/Throat:     Mouth: Mucous membranes are moist.     Pharynx: Oropharynx is clear. Posterior oropharyngeal erythema present.  Eyes:      Conjunctiva/sclera: Conjunctivae normal.     Pupils: Pupils are equal, round, and reactive to light.  Cardiovascular:     Rate and Rhythm: Normal rate and regular rhythm.     Pulses: Normal pulses.     Heart sounds: Normal heart sounds.  Pulmonary:     Effort: Pulmonary effort is normal.     Breath sounds: Normal breath sounds.  Abdominal:     Palpations: Abdomen is soft.  Musculoskeletal:        General: Normal  range of motion.     Cervical back: Normal range of motion and neck supple.  Lymphadenopathy:     Cervical: No cervical adenopathy.  Skin:    General: Skin is warm and dry.     Capillary Refill: Capillary refill takes less than 2 seconds.  Neurological:     General: No focal deficit present.     Mental Status: He is alert and oriented to person, place, and time.  Psychiatric:        Mood and Affect: Mood normal.        Behavior: Behavior normal.        Thought Content: Thought content normal.        Judgment: Judgment normal.    Today's Vitals   10/03/21 1906  BP: 140/70  Pulse: 80  Temp: 98 F (36.7 C)  Weight: 189 lb (85.7 kg)  Height: 6' (1.829 m)   Body mass index is 25.63 kg/m.   Wt Readings from Last 3 Encounters:  10/03/21 189 lb (85.7 kg)  07/09/21 189 lb 3.2 oz (85.8 kg)  04/27/21 185 lb 12.8 oz (84.3 kg)    Health Maintenance Due  Topic Date Due   COVID-19 Vaccine (1) 02/09/1942   INFLUENZA VACCINE  04/26/2021    There are no preventive care reminders to display for this patient.   Lab Results  Component Value Date   TSH 3.320 10/05/2020   Lab Results  Component Value Date   WBC 7.5 10/05/2020   HGB 15.0 10/05/2020   HCT 45.6 10/05/2020   MCV 96 10/05/2020   PLT 261 10/05/2020   Lab Results  Component Value Date   NA 141 10/05/2020   K 4.2 10/05/2020   CO2 26 10/05/2020   GLUCOSE 85 10/05/2020   BUN 18 10/05/2020   CREATININE 1.01 10/05/2020   BILITOT 0.6 10/05/2020   ALKPHOS 58 10/05/2020   AST 12 10/05/2020   ALT  12 10/05/2020   PROT 5.1 (L) 10/05/2020   ALBUMIN 3.3 (L) 10/05/2020   CALCIUM 8.6 10/05/2020   ANIONGAP 7 09/04/2019   Lab Results  Component Value Date   CHOL 158 10/05/2020   Lab Results  Component Value Date   HDL 47 10/05/2020   Lab Results  Component Value Date   LDLCALC 96 10/05/2020   Lab Results  Component Value Date   TRIG 80 10/05/2020   Lab Results  Component Value Date   CHOLHDL 3.4 10/05/2020   Lab Results  Component Value Date   HGBA1C 5.6 10/05/2020       Assessment & Plan:  1. Acute non-recurrent pansinusitis Flu swab negative today. Start z-pack. Take as directed for 5 days. Rest and increase fluids. Continue using OTC medication to control symptoms.   - azithromycin (ZITHROMAX) 250 MG tablet; z-pack - take as directed for 5 days  Dispense: 6 tablet; Refill: 0 - POCT Influenza A/B   Problem List Items Addressed This Visit       Respiratory   Acute non-recurrent pansinusitis - Primary   Relevant Medications   azithromycin (ZITHROMAX) 250 MG tablet   Other Relevant Orders   POCT Influenza A/B (Completed)     Meds ordered this encounter  Medications   azithromycin (ZITHROMAX) 250 MG tablet    Sig: z-pack - take as directed for 5 days    Dispense:  6 tablet    Refill:  0    Order Specific Question:   Supervising Provider    Answer:   Madilyn Fireman,  CATHERINE D [place] E Dalyn Kjos, NP

## 2021-10-03 ENCOUNTER — Encounter: Payer: Self-pay | Admitting: Nurse Practitioner

## 2021-10-03 DIAGNOSIS — J0140 Acute pansinusitis, unspecified: Secondary | ICD-10-CM | POA: Insufficient documentation

## 2021-10-05 DIAGNOSIS — H26492 Other secondary cataract, left eye: Secondary | ICD-10-CM | POA: Diagnosis not present

## 2021-10-05 DIAGNOSIS — H35372 Puckering of macula, left eye: Secondary | ICD-10-CM | POA: Diagnosis not present

## 2021-10-05 DIAGNOSIS — H43813 Vitreous degeneration, bilateral: Secondary | ICD-10-CM | POA: Diagnosis not present

## 2021-10-05 DIAGNOSIS — H524 Presbyopia: Secondary | ICD-10-CM | POA: Diagnosis not present

## 2021-11-24 ENCOUNTER — Other Ambulatory Visit: Payer: Self-pay | Admitting: Physician Assistant

## 2021-11-24 DIAGNOSIS — N400 Enlarged prostate without lower urinary tract symptoms: Secondary | ICD-10-CM

## 2021-12-07 DIAGNOSIS — D225 Melanocytic nevi of trunk: Secondary | ICD-10-CM | POA: Diagnosis not present

## 2021-12-07 DIAGNOSIS — L988 Other specified disorders of the skin and subcutaneous tissue: Secondary | ICD-10-CM | POA: Diagnosis not present

## 2021-12-07 DIAGNOSIS — Z08 Encounter for follow-up examination after completed treatment for malignant neoplasm: Secondary | ICD-10-CM | POA: Diagnosis not present

## 2021-12-07 DIAGNOSIS — L570 Actinic keratosis: Secondary | ICD-10-CM | POA: Diagnosis not present

## 2021-12-07 DIAGNOSIS — Z85828 Personal history of other malignant neoplasm of skin: Secondary | ICD-10-CM | POA: Diagnosis not present

## 2021-12-07 DIAGNOSIS — L821 Other seborrheic keratosis: Secondary | ICD-10-CM | POA: Diagnosis not present

## 2021-12-07 DIAGNOSIS — L578 Other skin changes due to chronic exposure to nonionizing radiation: Secondary | ICD-10-CM | POA: Diagnosis not present

## 2021-12-13 ENCOUNTER — Other Ambulatory Visit: Payer: Self-pay

## 2021-12-13 ENCOUNTER — Other Ambulatory Visit: Payer: PPO

## 2021-12-13 DIAGNOSIS — Z130 Encounter for screening for diseases of the blood and blood-forming organs and certain disorders involving the immune mechanism: Secondary | ICD-10-CM

## 2021-12-13 DIAGNOSIS — E785 Hyperlipidemia, unspecified: Secondary | ICD-10-CM

## 2021-12-13 DIAGNOSIS — Z0000 Encounter for general adult medical examination without abnormal findings: Secondary | ICD-10-CM

## 2021-12-13 DIAGNOSIS — Z1329 Encounter for screening for other suspected endocrine disorder: Secondary | ICD-10-CM | POA: Diagnosis not present

## 2021-12-13 DIAGNOSIS — Z1321 Encounter for screening for nutritional disorder: Secondary | ICD-10-CM | POA: Diagnosis not present

## 2021-12-13 DIAGNOSIS — Z13228 Encounter for screening for other metabolic disorders: Secondary | ICD-10-CM | POA: Diagnosis not present

## 2021-12-13 DIAGNOSIS — I10 Essential (primary) hypertension: Secondary | ICD-10-CM

## 2021-12-14 LAB — COMPREHENSIVE METABOLIC PANEL
ALT: 16 IU/L (ref 0–44)
AST: 15 IU/L (ref 0–40)
Albumin/Globulin Ratio: 2.7 — ABNORMAL HIGH (ref 1.2–2.2)
Albumin: 3.5 g/dL — ABNORMAL LOW (ref 3.6–4.6)
Alkaline Phosphatase: 59 IU/L (ref 44–121)
BUN/Creatinine Ratio: 17 (ref 10–24)
BUN: 16 mg/dL (ref 8–27)
Bilirubin Total: 0.7 mg/dL (ref 0.0–1.2)
CO2: 28 mmol/L (ref 20–29)
Calcium: 9 mg/dL (ref 8.6–10.2)
Chloride: 106 mmol/L (ref 96–106)
Creatinine, Ser: 0.93 mg/dL (ref 0.76–1.27)
Globulin, Total: 1.3 g/dL — ABNORMAL LOW (ref 1.5–4.5)
Glucose: 89 mg/dL (ref 70–99)
Potassium: 4.6 mmol/L (ref 3.5–5.2)
Sodium: 144 mmol/L (ref 134–144)
Total Protein: 4.8 g/dL — ABNORMAL LOW (ref 6.0–8.5)
eGFR: 82 mL/min/{1.73_m2} (ref >59)

## 2021-12-14 LAB — LIPID PANEL
Chol/HDL Ratio: 3.4 ratio (ref 0.0–5.0)
Cholesterol, Total: 156 mg/dL (ref 100–199)
HDL: 46 mg/dL (ref >39)
LDL Chol Calc (NIH): 95 mg/dL (ref 0–99)
Triglycerides: 79 mg/dL (ref 0–149)
VLDL Cholesterol Cal: 15 mg/dL (ref 5–40)

## 2021-12-14 LAB — CBC WITH DIFFERENTIAL/PLATELET
Basophils Absolute: 0.1 10*3/uL (ref 0.0–0.2)
Basos: 2 %
EOS (ABSOLUTE): 0.2 10*3/uL (ref 0.0–0.4)
Eos: 3 %
Hematocrit: 44.4 % (ref 37.5–51.0)
Hemoglobin: 15.4 g/dL (ref 13.0–17.7)
Immature Grans (Abs): 0 10*3/uL (ref 0.0–0.1)
Immature Granulocytes: 0 %
Lymphocytes Absolute: 2.1 10*3/uL (ref 0.7–3.1)
Lymphs: 27 %
MCH: 32.7 pg (ref 26.6–33.0)
MCHC: 34.7 g/dL (ref 31.5–35.7)
MCV: 94 fL (ref 79–97)
Monocytes Absolute: 0.7 10*3/uL (ref 0.1–0.9)
Monocytes: 8 %
Neutrophils Absolute: 4.7 10*3/uL (ref 1.4–7.0)
Neutrophils: 60 %
Platelets: 441 10*3/uL (ref 150–450)
RBC: 4.71 x10E6/uL (ref 4.14–5.80)
RDW: 13.4 % (ref 11.6–15.4)
WBC: 7.8 10*3/uL (ref 3.4–10.8)

## 2021-12-14 LAB — TSH: TSH: 3.12 u[IU]/mL (ref 0.450–4.500)

## 2021-12-14 LAB — HEMOGLOBIN A1C
Est. average glucose Bld gHb Est-mCnc: 111 mg/dL
Hgb A1c MFr Bld: 5.5 % (ref 4.8–5.6)

## 2021-12-20 ENCOUNTER — Ambulatory Visit (INDEPENDENT_AMBULATORY_CARE_PROVIDER_SITE_OTHER): Payer: PPO | Admitting: Physician Assistant

## 2021-12-20 ENCOUNTER — Other Ambulatory Visit: Payer: Self-pay

## 2021-12-20 ENCOUNTER — Encounter: Payer: Self-pay | Admitting: Physician Assistant

## 2021-12-20 VITALS — BP 110/70 | HR 80 | Temp 98.3°F | Ht 72.0 in | Wt 190.0 lb

## 2021-12-20 DIAGNOSIS — E785 Hyperlipidemia, unspecified: Secondary | ICD-10-CM

## 2021-12-20 DIAGNOSIS — Z0000 Encounter for general adult medical examination without abnormal findings: Secondary | ICD-10-CM

## 2021-12-20 DIAGNOSIS — I10 Essential (primary) hypertension: Secondary | ICD-10-CM

## 2021-12-20 DIAGNOSIS — M48062 Spinal stenosis, lumbar region with neurogenic claudication: Secondary | ICD-10-CM

## 2021-12-20 MED ORDER — GABAPENTIN 300 MG PO CAPS: ORAL_CAPSULE | ORAL | 0 refills | Status: DC

## 2021-12-20 NOTE — Progress Notes (Signed)
? ?Complete physical exam ? ? ?Patient: Larry Harris   DOB: 06/23/1941   81 y.o. Male  MRN: [id number] ?Visit Date: 12/20/2021 ? ? ?Chief Complaint  ?Patient presents with  ? Annual Exam  ? ?Subjective  ?  ?Larry Harris is a 81 y.o. male who presents today for a complete physical exam.  ?He reports consuming a low fat diet.  Walking 0.5 mile daily, has not been more active due to numbness and pain of RLE.  He generally feels fairly well. He does not have additional problems to discuss today.  ? ? ? ?Past Medical History:  ?Diagnosis Date  ? Acute rhinitis   ? Allergic rhinitis, cause unspecified   ? Allergy   ? BPH (benign prostatic hyperplasia)   ? CAD (coronary artery disease)   ? Cancer [facility])   ? scalp basal cell skin ca  ? Cataract   ? bilateral cateracts removed  ? Clotting disorder ([place])   ? Hypercholesteremia   ? Hypotension   ? Neuralgia, neuritis, and radiculitis, unspecified   ? Osteoarthritis   ? Spinal stenosis of lumbar region 06/04/2014  ? Thrombocytopenia, unspecified ([place])   ? ?Past Surgical History:  ?Procedure Laterality Date  ? APPENDECTOMY  1958  ? bilateral cateracts removed    ? CHOLECYSTECTOMY N/A   ? [employer] 12/13/2020  ? COLONOSCOPY    ? EYE SURGERY N/A   ? [employer] 12/13/2020  ? HERNIA REPAIR  1965  ? x2  ? ?Social History  ? ?Socioeconomic History  ? Marital status: Widowed  ?  Spouse name: Not on file  ? Number of children: 2  ? Years of education: college 4  ? Highest education level: Not on file  ?Occupational History  ? Occupation: Retired  ?Tobacco Use  ? Smoking status: Former  ?  Packs/day: 1.00  ?  Years: 25.00  ?  Pack years: 25.00  ?  Types: Cigarettes  ?  Quit date: 07/27/1994  ?  Years since quitting: 27.4  ? Smokeless tobacco: Never  ?Vaping Use  ? Vaping Use: Never used  ?Substance and Sexual Activity  ? Alcohol use: Yes  ?  Alcohol/week: 1.0 standard drink  ?  Types: 1 Glasses of wine per week  ?  Comment: occasional  ? Drug use: No  ? Sexual activity: Not on file   ?Other Topics Concern  ? Not on file  ?Social History Narrative  ? The patient is widowed. 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 He has no history of alcohol use. He has previously done regular exercise until developing a back problem.  ?   ? Patient drinks about 3-4 cups of coffee daily.  ? Patient is right handed.   ? ?Social Determinants of Health  ? ?Financial Resource Strain: Not on file  ?Food Insecurity: Not on file  ?Transportation Needs: Not on file  ?Physical Activity: Not on file  ?Stress: Not on file  ?Social Connections: Not on file  ?Intimate Partner Violence: Not on file  ? ? ? ?Medications: ?Outpatient Medications Prior to Visit  ?Medication Sig  ? aspirin 325 MG tablet Take 325 mg by mouth daily.  ? b complex vitamins tablet Take 1 tablet by mouth daily.  ? calcium carbonate (OS-CAL) 600 MG TABS Take 600 mg by mouth daily.  ? diphenhydrAMINE (BENADRYL) 25 mg capsule Take 25 mg by mouth as needed (insomnia).  ? doxazosin (CARDURA) 2 MG tablet TAKE 1 TABLET BY MOUTH EVERYDAY  AT BEDTIME  ? fluticasone (FLONASE) 50 MCG/ACT nasal spray SPRAY 2 SPRAYS INTO EACH NOSTRIL EVERY DAY  ? hydrocortisone (ANUSOL-HC) 25 MG suppository Place 1 suppository (25 mg total) rectally 2 (two) times daily.  ? Multiple Vitamin (MULTIVITAMIN) capsule Take 1 capsule by mouth daily.  ? promethazine-dextromethorphan (PROMETHAZINE-DM) 6.25-15 MG/5ML syrup Take 5 mLs by mouth at bedtime as needed for cough.  ? vitamin B-12 (CYANOCOBALAMIN) 1000 MCG tablet Take 1,000 mcg by mouth daily.  ? VITAMIN D, CHOLECALCIFEROL, PO Take 1,000 Units by mouth daily.  ? [DISCONTINUED] gabapentin (NEURONTIN) 300 MG capsule TAKE 1 CAPSULE BY MOUTH THREE TIMES A DAY  ? [DISCONTINUED] azithromycin (ZITHROMAX) 250 MG tablet z-pack - take as directed for 5 days  ? ?No facility-administered medications prior to visit.  ? ? ?Review of Systems ?Review of Systems:  ?A fourteen system review of systems was performed and found to be positive  as per HPI. ? ?Last CBC ?Lab Results  ?Component Value Date  ? WBC 7.8 12/13/2021  ? HGB 15.4 12/13/2021  ? HCT 44.4 12/13/2021  ? MCV 94 12/13/2021  ? MCH 32.7 12/13/2021  ? RDW 13.4 12/13/2021  ? PLT 441 12/13/2021  ? ?Last metabolic panel ?Lab Results  ?Component Value Date  ? GLUCOSE 89 12/13/2021  ? NA 144 12/13/2021  ? K 4.6 12/13/2021  ? CL 106 12/13/2021  ? CO2 28 12/13/2021  ? BUN 16 12/13/2021  ? CREATININE 0.93 12/13/2021  ? EGFR 82 12/13/2021  ? CALCIUM 9.0 12/13/2021  ? PROT 4.8 (L) 12/13/2021  ? ALBUMIN 3.5 (L) 12/13/2021  ? LABGLOB 1.3 (L) 12/13/2021  ? AGRATIO 2.7 (H) 12/13/2021  ? BILITOT 0.7 12/13/2021  ? ALKPHOS 59 12/13/2021  ? AST 15 12/13/2021  ? ALT 16 12/13/2021  ? ANIONGAP 7 09/04/2019  ? ?Last lipids ?Lab Results  ?Component Value Date  ? CHOL 156 12/13/2021  ? HDL 46 12/13/2021  ? [place] 95 12/13/2021  ? TRIG 79 12/13/2021  ? CHOLHDL 3.4 12/13/2021  ? ?Last hemoglobin A1c ?Lab Results  ?Component Value Date  ? HGBA1C 5.5 12/13/2021  ? ?Last thyroid functions ?Lab Results  ?Component Value Date  ? TSH 3.120 12/13/2021  ? T3TOTAL 108 08/26/2019  ? ?Last vitamin D ?Lab Results  ?Component Value Date  ? VD25OH 71.4 08/26/2019  ? ? Objective  ?  ?BP 110/70   Pulse 80   Temp 98.3 ?F (36.8 ?C)   Ht 6' (1.829 m)   Wt 190 lb (86.2 kg)   SpO2 98%   BMI 25.77 kg/m?  ?BP Readings from Last 3 Encounters:  ?12/20/21 110/70  ?10/03/21 140/70  ?07/09/21 (!) 147/80  ? ?Wt Readings from Last 3 Encounters:  ?12/20/21 190 lb (86.2 kg)  ?10/03/21 189 lb (85.7 kg)  ?07/09/21 189 lb 3.2 oz (85.8 kg)  ? ? ?Physical Exam  ? ?General Appearance:     ?  Alert, cooperative, in no acute distress, appears stated age  ?Head:    Normocephalic, without obvious abnormality, atraumatic  ?Eyes:    PERRL, conjunctiva/corneas clear, EOM's intact, fundi  ?  benign, both eyes       ?Ears:    Normal TM's and external ear canals, both ears  ?Nose:   Nares normal, septum midline, mucosa normal, no drainage ?  or sinus  tenderness  ?Throat:   Lips, mucosa, and tongue normal; teeth and gums normal  ?Neck:   Supple, symmetrical, trachea midline, no adenopathy;     ?  thyroid:  No enlargement/tenderness; no JVD  ?Back:     Symmetric, no curvature, ROM normal, no CVA tenderness  ?Lungs:     Clear to auscultation bilaterally, respirations unlabored  ?Chest wall:    No tenderness or deformity  ?Heart:    Normal heart rate. Normal rhythm. No murmurs, rubs, or gallops.  S1 and S2 normal  ?Abdomen:     Soft, non-tender, bowel sounds active all four quadrants,  ?  no masses, no organomegaly  ?Genitalia:    deferred  ?Rectal:    deferred  ?Extremities:   All extremities are intact. No cyanosis or edema  ?Pulses:   2+ and symmetric all extremities  ?Skin:   Skin color, texture, turgor normal, no rashes or lesions  ?Lymph nodes:   Cervical and supraclavicular nodes normal  ?Neurologic:   CNII-XII grossly intact.  ? ? ? ?Last depression screening scores ? ?  12/20/2021  ? 10:15 AM 07/09/2021  ? 11:12 AM 04/27/2021  ?  2:59 PM  ?PHQ 2/9 Scores  ?PHQ - 2 Score 0 0 0  ?PHQ- 9 Score 2 0 1  ? ?Last fall risk screening ? ?  12/20/2021  ? 10:14 AM  ?Fall Risk   ?Falls in the past year? 0  ?Number falls in past yr: 0  ?Injury with Fall? 0  ?Risk for fall due to : No Fall Risks  ?Follow up Falls evaluation completed  ? ? ? ?No results found for any visits on 12/20/21. ? Assessment & Plan  ?  ?Routine Health Maintenance and Physical Exam ? ?Exercise Activities and Dietary recommendations ?-Discussed heart healthy diet low in fat and carbohydrates. ? ?Immunization History  ?Administered Date(s) Administered  ? Influenza, High Dose Seasonal PF 06/20/2017, 07/17/2018, 06/10/2019  ? Influenza-Unspecified 06/27/2015, 07/12/2016, 06/20/2017, 07/17/2018, 06/17/2020  ? Moderna Covid-19 Vaccine Bivalent Booster 32yr & up 07/16/2021  ? Moderna SARS-COV2 Booster Vaccination 07/27/2020, 12/30/2020  ? Moderna Sars-Covid-2 Vaccination 10/08/2019, 11/05/2019  ?  Pneumococcal Conjugate-13 08/25/2017  ? Pneumococcal Polysaccharide-23 08/01/2016  ? Tdap 08/01/2016  ? Zoster Recombinat (Shingrix) 03/26/2018, 08/02/2018  ? Zoster, Live 09/27/2011  ? ? ?Health Maintenance  ?Topic Date Due

## 2021-12-20 NOTE — Patient Instructions (Signed)
Preventive Care 65 Years and Older, Male °Preventive care refers to lifestyle choices and visits with your health care provider that can promote health and wellness. Preventive care visits are also called wellness exams. °What can I expect for my preventive care visit? °Counseling °During your preventive care visit, your health care provider may ask about your: °Medical history, including: °Past medical problems. °Family medical history. °History of falls. °Current health, including: °Emotional well-being. °Home life and relationship well-being. °Sexual activity. °Memory and ability to understand (cognition). °Lifestyle, including: °Alcohol, nicotine or tobacco, and drug use. °Access to firearms. °Diet, exercise, and sleep habits. °Work and work environment. °Sunscreen use. °Safety issues such as seatbelt and bike helmet use. °Physical exam °Your health care provider will check your: °Height and weight. These may be used to calculate your BMI (body mass index). BMI is a measurement that tells if you are at a healthy weight. °Waist circumference. This measures the distance around your waistline. This measurement also tells if you are at a healthy weight and may help predict your risk of certain diseases, such as type 2 diabetes and high blood pressure. °Heart rate and blood pressure. °Body temperature. °Skin for abnormal spots. °What immunizations do I need? °Vaccines are usually given at various ages, according to a schedule. Your health care provider will recommend vaccines for you based on your age, medical history, and lifestyle or other factors, such as travel or where you work. °What tests do I need? °Screening °Your health care provider may recommend screening tests for certain conditions. This may include: °Lipid and cholesterol levels. °Diabetes screening. This is done by checking your blood sugar (glucose) after you have not eaten for a while (fasting). °Hepatitis C test. °Hepatitis B test. °HIV (human  immunodeficiency virus) test. °STI (sexually transmitted infection) testing, if you are at risk. °Lung cancer screening. °Colorectal cancer screening. °Prostate cancer screening. °Abdominal aortic aneurysm (AAA) screening. You may need this if you are a current or former smoker. °Talk with your health care provider about your test results, treatment options, and if necessary, the need for more tests. °Follow these instructions at home: °Eating and drinking ° °Eat a diet that includes fresh fruits and vegetables, whole grains, lean protein, and low-fat dairy products. Limit your intake of foods with high amounts of sugar, saturated fats, and salt. °Take vitamin and mineral supplements as recommended by your health care provider. °Do not drink alcohol if your health care provider tells you not to drink. °If you drink alcohol: °Limit how much you have to 0-2 drinks a day. °Know how much alcohol is in your drink. In the [place] one drink equals one 12 oz bottle of beer (355 mL), one 5 oz glass of wine (148 mL), or one 1½ oz glass of hard liquor (44 mL). °Lifestyle °Brush your teeth every morning and night with fluoride toothpaste. Floss one time each day. °Exercise for at least 30 minutes 5 or more days each week. °Do not use any products that contain nicotine or tobacco. These products include cigarettes, chewing tobacco, and vaping devices, such as e-cigarettes. If you need help quitting, ask your health care provider. °Do not use drugs. °If you are sexually active, practice safe sex. Use a condom or other form of protection to prevent STIs. °Take aspirin only as told by your health care provider. Make sure that you understand how much to take and what form to take. Work with your health care provider to find out whether it is safe and   beneficial for you to take aspirin daily. °Ask your health care provider if you need to take a cholesterol-lowering medicine (statin). °Find healthy ways to manage stress, such  as: °Meditation, yoga, or listening to music. °Journaling. °Talking to a trusted person. °Spending time with friends and family. °Safety °Always wear your seat belt while driving or riding in a vehicle. °Do not drive: °If you have been drinking alcohol. Do not ride with someone who has been drinking. °When you are tired or distracted. °While texting. °If you have been using any mind-altering substances or drugs. °Wear a helmet and other protective equipment during sports activities. °If you have firearms in your house, make sure you follow all gun safety procedures. °Minimize exposure to UV radiation to reduce your risk of skin cancer. °What's next? °Visit your health care provider once a year for an annual wellness visit. °Ask your health care provider how often you should have your eyes and teeth checked. °Stay up to date on all vaccines. °This information is not intended to replace advice given to you by your health care provider. Make sure you discuss any questions you have with your health care provider. °Document Revised: 03/10/2021 Document Reviewed: 03/10/2021 °Elsevier Patient Education © 2022 [employer] ° °

## 2022-02-09 ENCOUNTER — Encounter: Payer: Self-pay | Admitting: Diagnostic Neuroimaging

## 2022-02-09 ENCOUNTER — Telehealth: Payer: Self-pay | Admitting: Diagnostic Neuroimaging

## 2022-02-09 ENCOUNTER — Ambulatory Visit: Payer: PPO | Admitting: Diagnostic Neuroimaging

## 2022-02-09 VITALS — BP 140/84 | HR 78 | Ht 72.0 in | Wt 183.0 lb

## 2022-02-09 DIAGNOSIS — M5416 Radiculopathy, lumbar region: Secondary | ICD-10-CM | POA: Diagnosis not present

## 2022-02-09 DIAGNOSIS — M48061 Spinal stenosis, lumbar region without neurogenic claudication: Secondary | ICD-10-CM | POA: Diagnosis not present

## 2022-02-09 NOTE — Telephone Encounter (Signed)
Referral for Neurosurgery sent to [facility] [phone]. ?

## 2022-02-09 NOTE — Progress Notes (Signed)
? ?[facility] ? ?PATIENT: Larry Harris ?DOB: 08/01/1941 ? ?REFERRING CLINICIAN: Lorrene Reid, PA-C ?HISTORY FROM: patient ?REASON FOR VISIT: new consult ? ? ?HISTORICAL ? ?CHIEF COMPLAINT:  ?Chief Complaint  ?Patient presents with  ? New Patient (Initial Visit)  ?  Rm 7 alone here to f/u on Spinal Stenosis. He was previously established with Dr. Erling Harris, then Dr. Jannifer Harris. He would like to discuss the possibility of having epidural injection. He would get these injections previously with Dr. Jannifer Harris. Feels like his last one was in the year 2013 or 2014.    ? ? ?HISTORY OF PRESENT ILLNESS:  ? ?UPDATE (02/09/22, VRP): Since last visit, doing well until 3 months ago. More active outdoors, now with more numbness and pain in right low back, buttock, radiating to thigh, and feet. Interested in lumbar epidural again.  ? ?PRIOR HPI 06/12/17 Dr. Jannifer Harris: [name] is a 81 year old right-handed white male with a history of lumbosacral spinal stenosis at the L4-5 level, the patient may have intermittent pain down the right leg. Overall, he has been very stable with his symptoms, he has moved to a town home that does not require a lot of yard work, and for this reason he has done a lot better with his back pain. He walks 1/2 mile a day, he does not engage in any other physical activity, however. The patient reports no difficulty controlling the bowels or the bladder, the denies any significant weakness in the legs, he has had one fall associated with being pushed over by a large dog but otherwise he has not fallen because of balance issues. He takes Aleve in the morning and he takes gabapentin 300 mg 3 times daily. This does seem to help him most days with the pain. He has not required an epidural steroid injection recently. He returns for an evaluation. ? ? ?REVIEW OF SYSTEMS: Full 14 system review of systems performed and negative with exception of: as per HPI. ? ?ALLERGIES: ?No Known  Allergies ? ?HOME MEDICATIONS: ?Outpatient Medications Prior to Visit  ?Medication Sig Dispense Refill  ? aspirin 325 MG tablet Take 325 mg by mouth daily.    ? b complex vitamins tablet Take 1 tablet by mouth daily.    ? calcium carbonate (OS-CAL) 600 MG TABS Take 600 mg by mouth daily.    ? diphenhydrAMINE (BENADRYL) 25 mg capsule Take 25 mg by mouth as needed (insomnia).    ? doxazosin (CARDURA) 2 MG tablet TAKE 1 TABLET BY MOUTH EVERYDAY AT BEDTIME 90 tablet 0  ? fluticasone (FLONASE) 50 MCG/ACT nasal spray SPRAY 2 SPRAYS INTO EACH NOSTRIL EVERY DAY (Patient taking differently: 2 sprays as needed.) 48 mL 1  ? gabapentin (NEURONTIN) 300 MG capsule TAKE 1 CAPSULE BY MOUTH THREE TIMES A DAY 270 capsule 0  ? hydrocortisone (ANUSOL-HC) 25 MG suppository Place 1 suppository (25 mg total) rectally 2 (two) times daily. 12 suppository 0  ? Multiple Vitamin (MULTIVITAMIN) capsule Take 1 capsule by mouth daily.    ? vitamin B-12 (CYANOCOBALAMIN) 1000 MCG tablet Take 1,000 mcg by mouth daily.    ? VITAMIN D, CHOLECALCIFEROL, PO Take 1,000 Units by mouth daily.    ? promethazine-dextromethorphan (PROMETHAZINE-DM) 6.25-15 MG/5ML syrup Take 5 mLs by mouth at bedtime as needed for cough. 100 mL 0  ? ?No facility-administered medications prior to visit.  ? ? ? ? ?PHYSICAL EXAM ? ?GENERAL EXAM/CONSTITUTIONAL: ?Vitals:  ?Vitals:  ? 02/09/22 1009  ?BP: 140/84  ?Pulse: 78  ?  Weight: 183 lb (83 kg)  ?Height: 6' (1.829 m)  ? ?Body mass index is 24.82 kg/m?. ?Wt Readings from Last 3 Encounters:  ?02/09/22 183 lb (83 kg)  ?12/20/21 190 lb (86.2 kg)  ?10/03/21 189 lb (85.7 kg)  ? ?Patient is in no distress; well developed, nourished and groomed; neck is supple ? ?CARDIOVASCULAR: ?Examination of carotid arteries is normal; no carotid bruits ?Regular rate and rhythm, no murmurs ?Examination of peripheral vascular system by observation and palpation is normal ? ?EYES: ?Ophthalmoscopic exam of optic discs and posterior segments is normal; no  papilledema or hemorrhages ?No results found. ? ?MUSCULOSKELETAL: ?Gait, strength, tone, movements noted in Neurologic exam below ? ?NEUROLOGIC: ?MENTAL STATUS:  ? ?  08/01/2016  ?  8:45 AM  ?MMSE - Mini Mental State Exam  ?Orientation to time 5  ?Orientation to Place 5  ?Registration 3  ?Attention/ Calculation 5  ?Recall 3  ?Language- name 2 objects 2  ?Language- repeat 1  ?Language- follow 3 step command 3  ?Language- read & follow direction 1  ?Write a sentence 1  ?Copy design 1  ?Total score 30  ? ?awake, alert, oriented to person, place and time ?recent and remote memory intact ?normal attention and concentration ?language fluent, comprehension intact, naming intact ?fund of knowledge appropriate ? ?CRANIAL NERVE:  ?2nd - no papilledema on fundoscopic exam ?2nd, 3rd, 4th, 6th - pupils equal and reactive to light, visual fields full to confrontation, extraocular muscles intact, no nystagmus ?5th - facial sensation symmetric ?7th - facial strength symmetric ?8th - hearing intact ?9th - palate elevates symmetrically, uvula midline ?11th - shoulder shrug symmetric ?12th - tongue protrusion midline ? ?MOTOR:  ?normal bulk and tone, full strength in the BUE, BLE ? ?SENSORY:  ?normal and symmetric to light touch, temperature, vibration ? ?COORDINATION:  ?finger-nose-finger, fine finger movements normal ? ?REFLEXES:  ?deep tendon reflexes TRACE and symmetric ? ?GAIT/STATION:  ?narrow based gait ? ? ? ? ?DIAGNOSTIC DATA (LABS, IMAGING, TESTING) ?- I reviewed patient records, labs, notes, testing and imaging myself where available. ? ?Lab Results  ?Component Value Date  ? WBC 7.8 12/13/2021  ? HGB 15.4 12/13/2021  ? HCT 44.4 12/13/2021  ? MCV 94 12/13/2021  ? PLT 441 12/13/2021  ? ?   ?Component Value Date/Time  ? NA 144 12/13/2021 0851  ? K 4.6 12/13/2021 0851  ? CL 106 12/13/2021 0851  ? CO2 28 12/13/2021 0851  ? GLUCOSE 89 12/13/2021 0851  ? GLUCOSE 105 (H) 09/04/2019 1014  ? BUN 16 12/13/2021 0851  ? CREATININE 0.93  12/13/2021 0851  ? CREATININE 0.84 07/25/2016 0937  ? CALCIUM 9.0 12/13/2021 0851  ? PROT 4.8 (L) 12/13/2021 0851  ? ALBUMIN 3.5 (L) 12/13/2021 0851  ? AST 15 12/13/2021 0851  ? ALT 16 12/13/2021 0851  ? ALKPHOS 59 12/13/2021 0851  ? BILITOT 0.7 12/13/2021 0851  ? GFRNONAA 69 10/05/2020 0858  ? GFRAA 80 10/05/2020 0858  ? ?Lab Results  ?Component Value Date  ? CHOL 156 12/13/2021  ? HDL 46 12/13/2021  ? [place] 95 12/13/2021  ? TRIG 79 12/13/2021  ? CHOLHDL 3.4 12/13/2021  ? ?Lab Results  ?Component Value Date  ? HGBA1C 5.5 12/13/2021  ? ?Lab Results  ?Component Value Date  ? [id number] 799 07/25/2016  ? ?Lab Results  ?Component Value Date  ? TSH 3.120 12/13/2021  ? ? ?05/30/12 MRI lumbar spine [I reviewed images myself and agree with interpretation. -VRP]  ?- At L4-5: disc  bulging, ant spondylolisthesis, facet hypertrophy, with severe spinal stenosis  ? ? ? ?ASSESSMENT AND PLAN ? ?81 y.o. year old male here with: ? ? ?Dx: ? ?1. Right lumbar radiculitis   ?2. Spinal stenosis of lumbar region without neurogenic claudication   ? ? ?PLAN: ? ?CHRONIC LUMBAR SPINAL STENOSIS (L4-5; right lower back and leg pain) ?- symptoms flared up since Feb 2023; will refer to [place] [facility] (pain mgmt for right L4-5 epidural injection; last injection in 2015 with good results) ? ?Orders Placed This Encounter  ?Procedures  ? Ambulatory referral to Neurosurgery  ? ?Return for return to PCP, pending if symptoms worsen or fail to improve. ? ? ? ?Penni Bombard, MD 7/82/4235, 36:14 AM ?Certified in Neurology, Neurophysiology and Neuroimaging ? ?[facility] ?[address], [address] ?[place], [place] [address] ?[phone] ?

## 2022-02-18 ENCOUNTER — Other Ambulatory Visit: Payer: Self-pay | Admitting: Physician Assistant

## 2022-02-18 DIAGNOSIS — N400 Enlarged prostate without lower urinary tract symptoms: Secondary | ICD-10-CM

## 2022-03-02 DIAGNOSIS — M5416 Radiculopathy, lumbar region: Secondary | ICD-10-CM | POA: Diagnosis not present

## 2022-03-02 DIAGNOSIS — M48062 Spinal stenosis, lumbar region with neurogenic claudication: Secondary | ICD-10-CM | POA: Diagnosis not present

## 2022-03-25 DIAGNOSIS — M5416 Radiculopathy, lumbar region: Secondary | ICD-10-CM | POA: Diagnosis not present

## 2022-03-29 ENCOUNTER — Other Ambulatory Visit: Payer: Self-pay | Admitting: Physician Assistant

## 2022-03-29 DIAGNOSIS — M48062 Spinal stenosis, lumbar region with neurogenic claudication: Secondary | ICD-10-CM

## 2022-05-18 DIAGNOSIS — M48062 Spinal stenosis, lumbar region with neurogenic claudication: Secondary | ICD-10-CM | POA: Diagnosis not present

## 2022-05-18 DIAGNOSIS — M5416 Radiculopathy, lumbar region: Secondary | ICD-10-CM | POA: Diagnosis not present

## 2022-05-22 ENCOUNTER — Other Ambulatory Visit: Payer: Self-pay | Admitting: Physician Assistant

## 2022-05-22 DIAGNOSIS — N400 Enlarged prostate without lower urinary tract symptoms: Secondary | ICD-10-CM

## 2022-06-10 DIAGNOSIS — D485 Neoplasm of uncertain behavior of skin: Secondary | ICD-10-CM | POA: Diagnosis not present

## 2022-06-10 DIAGNOSIS — D225 Melanocytic nevi of trunk: Secondary | ICD-10-CM | POA: Diagnosis not present

## 2022-06-10 DIAGNOSIS — L821 Other seborrheic keratosis: Secondary | ICD-10-CM | POA: Diagnosis not present

## 2022-06-10 DIAGNOSIS — L578 Other skin changes due to chronic exposure to nonionizing radiation: Secondary | ICD-10-CM | POA: Diagnosis not present

## 2022-06-10 DIAGNOSIS — L570 Actinic keratosis: Secondary | ICD-10-CM | POA: Diagnosis not present

## 2022-06-10 DIAGNOSIS — L738 Other specified follicular disorders: Secondary | ICD-10-CM | POA: Diagnosis not present

## 2022-06-22 ENCOUNTER — Encounter: Payer: Self-pay | Admitting: Physician Assistant

## 2022-06-22 ENCOUNTER — Ambulatory Visit (INDEPENDENT_AMBULATORY_CARE_PROVIDER_SITE_OTHER): Payer: PPO | Admitting: Physician Assistant

## 2022-06-22 VITALS — BP 150/84 | HR 85 | Temp 97.2°F | Ht 72.0 in | Wt 183.0 lb

## 2022-06-22 DIAGNOSIS — E785 Hyperlipidemia, unspecified: Secondary | ICD-10-CM | POA: Diagnosis not present

## 2022-06-22 DIAGNOSIS — M48062 Spinal stenosis, lumbar region with neurogenic claudication: Secondary | ICD-10-CM | POA: Diagnosis not present

## 2022-06-22 DIAGNOSIS — I10 Essential (primary) hypertension: Secondary | ICD-10-CM

## 2022-06-22 MED ORDER — GABAPENTIN 300 MG PO CAPS: ORAL_CAPSULE | ORAL | 0 refills | Status: DC

## 2022-06-22 NOTE — Assessment & Plan Note (Signed)
-  Last lipid panel normal, LDL 95. Will repeat lipid pane with annual physical. Discussed a heart healthy diet.

## 2022-06-22 NOTE — Patient Instructions (Signed)
DASH Eating Plan DASH stands for Dietary Approaches to Stop Hypertension. The DASH eating plan is a healthy eating plan that has been shown to: Reduce high blood pressure (hypertension). Reduce your risk for type 2 diabetes, heart disease, and stroke. Help with weight loss. What are tips for following this plan? Reading food labels Check food labels for the amount of salt (sodium) per serving. Choose foods with less than 5 percent of the Daily Value of sodium. Generally, foods with less than 300 milligrams (mg) of sodium per serving fit into this eating plan. To find whole grains, look for the word "whole" as the first word in the ingredient list. Shopping Buy products labeled as "low-sodium" or "no salt added." Buy fresh foods. Avoid canned foods and pre-made or frozen meals. Cooking Avoid adding salt when cooking. Use salt-free seasonings or herbs instead of table salt or sea salt. Check with your health care provider or pharmacist before using salt substitutes. Do not fry foods. Cook foods using healthy methods such as baking, boiling, grilling, roasting, and broiling instead. Cook with heart-healthy oils, such as olive, canola, avocado, soybean, or sunflower oil. Meal planning  Eat a balanced diet that includes: 4 or more servings of fruits and 4 or more servings of vegetables each day. Try to fill one-half of your plate with fruits and vegetables. 6-8 servings of whole grains each day. Less than 6 oz (170 g) of lean meat, poultry, or fish each day. A 3-oz (85-g) serving of meat is about the same size as a deck of cards. One egg equals 1 oz (28 g). 2-3 servings of low-fat dairy each day. One serving is 1 cup (237 mL). 1 serving of nuts, seeds, or beans 5 times each week. 2-3 servings of heart-healthy fats. Healthy fats called omega-3 fatty acids are found in foods such as walnuts, flaxseeds, fortified milks, and eggs. These fats are also found in cold-water fish, such as sardines, salmon,  and mackerel. Limit how much you eat of: Canned or prepackaged foods. Food that is high in trans fat, such as some fried foods. Food that is high in saturated fat, such as fatty meat. Desserts and other sweets, sugary drinks, and other foods with added sugar. Full-fat dairy products. Do not salt foods before eating. Do not eat more than 4 egg yolks a week. Try to eat at least 2 vegetarian meals a week. Eat more home-cooked food and less restaurant, buffet, and fast food. Lifestyle When eating at a restaurant, ask that your food be prepared with less salt or no salt, if possible. If you drink alcohol: Limit how much you use to: 0-1 drink a day for women who are not pregnant. 0-2 drinks a day for men. Be aware of how much alcohol is in your drink. In the [place] one drink equals one 12 oz bottle of beer (355 mL), one 5 oz glass of wine (148 mL), or one 1 oz glass of hard liquor (44 mL). General information Avoid eating more than 2,300 mg of salt a day. If you have hypertension, you may need to reduce your sodium intake to 1,500 mg a day. Work with your health care provider to maintain a healthy body weight or to lose weight. Ask what an ideal weight is for you. Get at least 30 minutes of exercise that causes your heart to beat faster (aerobic exercise) most days of the week. Activities may include walking, swimming, or biking. Work with your health care provider or dietitian to   adjust your eating plan to your individual calorie needs. What foods should I eat? Fruits All fresh, dried, or frozen fruit. Canned fruit in natural juice (without added sugar). Vegetables Fresh or frozen vegetables (raw, steamed, roasted, or grilled). Low-sodium or reduced-sodium tomato and vegetable juice. Low-sodium or reduced-sodium tomato sauce and tomato paste. Low-sodium or reduced-sodium canned vegetables. Grains Whole-grain or whole-wheat bread. Whole-grain or whole-wheat pasta. Brown rice. Oatmeal. Quinoa.  Bulgur. Whole-grain and low-sodium cereals. Pita bread. Low-fat, low-sodium crackers. Whole-wheat flour tortillas. Meats and other proteins Skinless chicken or turkey. Ground chicken or turkey. Pork with fat trimmed off. Fish and seafood. Egg whites. Dried beans, peas, or lentils. Unsalted nuts, nut butters, and seeds. Unsalted canned beans. Lean cuts of beef with fat trimmed off. Low-sodium, lean precooked or cured meat, such as sausages or meat loaves. Dairy Low-fat (1%) or fat-free (skim) milk. Reduced-fat, low-fat, or fat-free cheeses. Nonfat, low-sodium ricotta or cottage cheese. Low-fat or nonfat yogurt. Low-fat, low-sodium cheese. Fats and oils Soft margarine without trans fats. Vegetable oil. Reduced-fat, low-fat, or light mayonnaise and salad dressings (reduced-sodium). Canola, safflower, olive, avocado, soybean, and sunflower oils. Avocado. Seasonings and condiments Herbs. Spices. Seasoning mixes without salt. Other foods Unsalted popcorn and pretzels. Fat-free sweets. The items listed above may not be a complete list of foods and beverages you can eat. Contact a dietitian for more information. What foods should I avoid? Fruits Canned fruit in a light or heavy syrup. Fried fruit. Fruit in cream or butter sauce. Vegetables Creamed or fried vegetables. Vegetables in a cheese sauce. Regular canned vegetables (not low-sodium or reduced-sodium). Regular canned tomato sauce and paste (not low-sodium or reduced-sodium). Regular tomato and vegetable juice (not low-sodium or reduced-sodium). Pickles. Olives. Grains Baked goods made with fat, such as croissants, muffins, or some breads. Dry pasta or rice meal packs. Meats and other proteins Fatty cuts of meat. Ribs. Fried meat. Bacon. Bologna, salami, and other precooked or cured meats, such as sausages or meat loaves. Fat from the back of a pig (fatback). Bratwurst. Salted nuts and seeds. Canned beans with added salt. Canned or smoked fish.  Whole eggs or egg yolks. Chicken or turkey with skin. Dairy Whole or 2% milk, cream, and half-and-half. Whole or full-fat cream cheese. Whole-fat or sweetened yogurt. Full-fat cheese. Nondairy creamers. Whipped toppings. Processed cheese and cheese spreads. Fats and oils Butter. Stick margarine. Lard. Shortening. Ghee. Bacon fat. Tropical oils, such as coconut, palm kernel, or palm oil. Seasonings and condiments Onion salt, garlic salt, seasoned salt, table salt, and sea salt. Worcestershire sauce. Tartar sauce. Barbecue sauce. Teriyaki sauce. Soy sauce, including reduced-sodium. Steak sauce. Canned and packaged gravies. Fish sauce. Oyster sauce. Cocktail sauce. Store-bought horseradish. Ketchup. Mustard. Meat flavorings and tenderizers. Bouillon cubes. Hot sauces. Pre-made or packaged marinades. Pre-made or packaged taco seasonings. Relishes. Regular salad dressings. Other foods Salted popcorn and pretzels. The items listed above may not be a complete list of foods and beverages you should avoid. Contact a dietitian for more information. Where to find more information National Heart, Lung, and Blood Institute: www.nhlbi.nih.gov American Heart Association: www.heart.org Academy of Nutrition and Dietetics: www.eatright.org National Kidney Foundation: www.kidney.org Summary The DASH eating plan is a healthy eating plan that has been shown to reduce high blood pressure (hypertension). It may also reduce your risk for type 2 diabetes, heart disease, and stroke. When on the DASH eating plan, aim to eat more fresh fruits and vegetables, whole grains, lean proteins, low-fat dairy, and heart-healthy fats. With the DASH   eating plan, you should limit salt (sodium) intake to 2,300 mg a day. If you have hypertension, you may need to reduce your sodium intake to 1,500 mg a day. Work with your health care provider or dietitian to adjust your eating plan to your individual calorie needs. This information is not  intended to replace advice given to you by your health care provider. Make sure you discuss any questions you have with your health care provider. Document Revised: 08/16/2019 Document Reviewed: 08/16/2019 Elsevier Patient Education  [place] Eating a healthy diet is important for the health of your heart. A heart-healthy eating plan includes: Eating less unhealthy fats. Eating more healthy fats. Eating less salt in your food. Salt is also called sodium. Making other changes in your diet. Talk with your doctor or a diet specialist (dietitian) to create an eating plan that is right for you. What is my plan? Your doctor may recommend an eating plan that includes: Total fat: ______% or less of total calories a day. Saturated fat: ______% or less of total calories a day. Cholesterol: less than _________mg a day. Sodium: less than _________mg a day. What are tips for following this plan? Cooking Avoid frying your food. Try to bake, boil, grill, or broil it instead. You can also reduce fat by: Removing the skin from poultry. Removing all visible fats from meats. Steaming vegetables in water or broth. Meal planning  At meals, divide your plate into four equal parts: Fill one-half of your plate with vegetables and green salads. Fill one-fourth of your plate with whole grains. Fill one-fourth of your plate with lean protein foods. Eat 2-4 cups of vegetables per day. One cup of vegetables is: 1 cup (91 g) broccoli or cauliflower florets. 2 medium carrots. 1 large bell pepper. 1 large sweet potato. 1 large tomato. 1 medium white potato. 2 cups (150 g) raw leafy greens. Eat 1-2 cups of fruit per day. One cup of fruit is: 1 small apple 1 large banana 1 cup (237 g) mixed fruit, 1 large orange,  cup (82 g) dried fruit, 1 cup (240 mL) 100% fruit juice. Eat more foods that have soluble fiber. These are apples, broccoli, carrots, beans, peas, and  barley. Try to get 20-30 g of fiber per day. Eat 4-5 servings of nuts, legumes, and seeds per week: 1 serving of dried beans or legumes equals  cup (90 g) cooked. 1 serving of nuts is  oz (12 almonds, 24 pistachios, or 7 walnut halves). 1 serving of seeds equals  oz (8 g). General information Eat more home-cooked food. Eat less restaurant, buffet, and fast food. Limit or avoid alcohol. Limit foods that are high in starch and sugar. Avoid fried foods. Lose weight if you are overweight. Keep track of how much salt (sodium) you eat. This is important if you have high blood pressure. Ask your doctor to tell you more about this. Try to add vegetarian meals each week. Fats Choose healthy fats. These include olive oil and canola oil, flaxseeds, walnuts, almonds, and seeds. Eat more omega-3 fats. These include salmon, mackerel, sardines, tuna, flaxseed oil, and ground flaxseeds. Try to eat fish at least 2 times each week. Check food labels. Avoid foods with trans fats or high amounts of saturated fat. Limit saturated fats. These are often found in animal products, such as meats, butter, and cream. These are also found in plant foods, such as palm oil, palm kernel oil, and coconut oil. Avoid  foods with partially hydrogenated oils in them. These have trans fats. Examples are stick margarine, some tub margarines, cookies, crackers, and other baked goods. What foods should I eat? Fruits All fresh, canned (in natural juice), or frozen fruits. Vegetables Fresh or frozen vegetables (raw, steamed, roasted, or grilled). Green salads. Grains Most grains. Choose whole wheat and whole grains most of the time. Rice and pasta, including brown rice and pastas made with whole wheat. Meats and other proteins Lean, well-trimmed beef, veal, pork, and lamb. Chicken and [place] without skin. All fish and shellfish. Wild duck, rabbit, pheasant, and venison. Egg whites or low-cholesterol egg substitutes. Dried  beans, peas, lentils, and tofu. Seeds and most nuts. Dairy Low-fat or nonfat cheeses, including ricotta and mozzarella. Skim or 1% milk that is liquid, powdered, or evaporated. Buttermilk that is made with low-fat milk. Nonfat or low-fat yogurt. Fats and oils Non-hydrogenated (trans-free) margarines. Vegetable oils, including soybean, sesame, sunflower, olive, peanut, safflower, corn, canola, and cottonseed. Salad dressings or mayonnaise made with a vegetable oil. Beverages Mineral water. Coffee and tea. Diet carbonated beverages. Sweets and desserts Sherbet, gelatin, and fruit ice. Small amounts of dark chocolate. Limit all sweets and desserts. Seasonings and condiments All seasonings and condiments. The items listed above may not be a complete list of foods and drinks you can eat. Contact a dietitian for more options. What foods should I avoid? Fruits Canned fruit in heavy syrup. Fruit in cream or butter sauce. Fried fruit. Limit coconut. Vegetables Vegetables cooked in cheese, cream, or butter sauce. Fried vegetables. Grains Breads that are made with saturated or trans fats, oils, or whole milk. Croissants. Sweet rolls. Donuts. High-fat crackers, such as cheese crackers. Meats and other proteins Fatty meats, such as hot dogs, ribs, sausage, bacon, rib-eye roast or steak. High-fat deli meats, such as salami and bologna. Caviar. Domestic duck and goose. Organ meats, such as liver. Dairy Cream, sour cream, cream cheese, and creamed cottage cheese. Whole-milk cheeses. Whole or 2% milk that is liquid, evaporated, or condensed. Whole buttermilk. Cream sauce or high-fat cheese sauce. Yogurt that is made from whole milk. Fats and oils Meat fat, or shortening. Cocoa butter, hydrogenated oils, palm oil, coconut oil, palm kernel oil. Solid fats and shortenings, including bacon fat, salt pork, lard, and butter. Nondairy cream substitutes. Salad dressings with cheese or sour cream. Beverages Regular  sodas and juice drinks with added sugar. Sweets and desserts Frosting. Pudding. Cookies. Cakes. Pies. Milk chocolate or white chocolate. Buttered syrups. Full-fat ice cream or ice cream drinks. The items listed above may not be a complete list of foods and drinks to avoid. Contact a dietitian for more information. Summary Heart-healthy meal planning includes eating less unhealthy fats, eating more healthy fats, and making other changes in your diet. Eat a balanced diet. This includes fruits and vegetables, low-fat or nonfat dairy, lean protein, nuts and legumes, whole grains, and heart-healthy oils and fats. This information is not intended to replace advice given to you by your health care provider. Make sure you discuss any questions you have with your health care provider. Document Revised: 10/18/2021 Document Reviewed: 10/18/2021 Elsevier Patient Education  [place].

## 2022-06-22 NOTE — Assessment & Plan Note (Signed)
-  Stable. Recommend to follow-up with pain management Davy Pique, Arturo Morton) as needed. Continue Gabapentin 300 mg TID (ok to continue with 1 cap in am and 2 cap at bedtime).

## 2022-06-22 NOTE — Progress Notes (Signed)
Established patient visit   Patient: Larry Harris   DOB: Sep 19, 1941   81 y.o. Male  MRN: [id number] Visit Date: 06/22/2022  Chief Complaint  Patient presents with   Follow-up   Subjective    HPI  Patient presents for chronic follow-up visit. Patient reports got a spinal epidural injection from pain specialist which helped with his back pain. Takes Gabapentin 300 mg in the morning and 2 capsules at bedtime which helps.  HTN: Pt denies chest pain, palpitations, dizziness or lower extremity swelling. Taking medication as directed without side effects. Checks BP at home few times/wk and readings range less than 140/90. Pt has worked on reducing salt intake.   HLD: Pt managing with diet. Has worked on reducing fat intake.     Medications: Outpatient Medications Prior to Visit  Medication Sig   aspirin 325 MG tablet Take 325 mg by mouth daily.   b complex vitamins tablet Take 1 tablet by mouth daily.   calcium carbonate (OS-CAL) 600 MG TABS Take 600 mg by mouth daily.   diphenhydrAMINE (BENADRYL) 25 mg capsule Take 25 mg by mouth as needed (insomnia).   doxazosin (CARDURA) 2 MG tablet TAKE 1 TABLET BY MOUTH EVERYDAY AT BEDTIME   fluticasone (FLONASE) 50 MCG/ACT nasal spray SPRAY 2 SPRAYS INTO EACH NOSTRIL EVERY DAY (Patient taking differently: 2 sprays as needed.)   hydrocortisone (ANUSOL-HC) 25 MG suppository Place 1 suppository (25 mg total) rectally 2 (two) times daily.   Multiple Vitamin (MULTIVITAMIN) capsule Take 1 capsule by mouth daily.   vitamin B-12 (CYANOCOBALAMIN) 1000 MCG tablet Take 1,000 mcg by mouth daily.   VITAMIN D, CHOLECALCIFEROL, PO Take 1,000 Units by mouth daily.   [DISCONTINUED] gabapentin (NEURONTIN) 300 MG capsule TAKE 1 CAPSULE BY MOUTH THREE TIMES A DAY   No facility-administered medications prior to visit.    Review of Systems A fourteen system review of systems was performed and found to be positive as per HPI.  Last CBC Lab Results   Component Value Date   WBC 7.8 12/13/2021   HGB 15.4 12/13/2021   HCT 44.4 12/13/2021   MCV 94 12/13/2021   MCH 32.7 12/13/2021   RDW 13.4 12/13/2021   PLT 441 85/88/5027   Last metabolic panel Lab Results  Component Value Date   GLUCOSE 89 12/13/2021   NA 144 12/13/2021   K 4.6 12/13/2021   CL 106 12/13/2021   CO2 28 12/13/2021   BUN 16 12/13/2021   CREATININE 0.93 12/13/2021   EGFR 82 12/13/2021   CALCIUM 9.0 12/13/2021   PROT 4.8 (L) 12/13/2021   ALBUMIN 3.5 (L) 12/13/2021   LABGLOB 1.3 (L) 12/13/2021   AGRATIO 2.7 (H) 12/13/2021   BILITOT 0.7 12/13/2021   ALKPHOS 59 12/13/2021   AST 15 12/13/2021   ALT 16 12/13/2021   ANIONGAP 7 09/04/2019   Last lipids Lab Results  Component Value Date   CHOL 156 12/13/2021   HDL 46 12/13/2021   LDLCALC 95 12/13/2021   TRIG 79 12/13/2021   CHOLHDL 3.4 12/13/2021   Last hemoglobin A1c Lab Results  Component Value Date   HGBA1C 5.5 12/13/2021   Last thyroid functions Lab Results  Component Value Date   TSH 3.120 12/13/2021   T3TOTAL 108 08/26/2019   Last vitamin D Lab Results  Component Value Date   VD25OH 71.4 08/26/2019       Objective    BP (!) 150/84   Pulse 85   Temp (!) 97.2 F (36.2 C) (Temporal)  Ht 6' (1.829 m)   Wt 183 lb (83 kg)   BMI 24.82 kg/m  BP Readings from Last 3 Encounters:  06/22/22 (!) 150/84  02/09/22 140/84  12/20/21 110/70   Wt Readings from Last 3 Encounters:  06/22/22 183 lb (83 kg)  02/09/22 183 lb (83 kg)  12/20/21 190 lb (86.2 kg)    Physical Exam  General:  Well Developed, well nourished, appropriate for stated age.  Neuro:  Alert and oriented,  extra-ocular muscles intact  HEENT:  Normocephalic, atraumatic, neck supple  Skin:  no gross rash, warm, pink. Cardiac:  RRR, S1 S2 Respiratory: CTA B/L  Vascular:  Ext warm, no cyanosis apprec.; cap RF less 2 sec. Psych:  No HI/SI, judgement and insight good, Euthymic mood. Full Affect.   No results found for any  visits on 06/22/22.  Assessment & Plan      Problem List Items Addressed This Visit       Cardiovascular and Mediastinum   Hypertension - Primary    -BP elevated on intake. Repeated with minimal improvement, advised patient to check his BP daily for 1 week and forward BP log especially if readings >140/90 for medication adjustments. Pt verbalized understanding. Will continue Cardura 2 mg at bedtime.        Other   Spinal stenosis of lumbar region (Chronic)    -Stable. Recommend to follow-up with pain management Davy Pique, Arturo Morton) as needed. Continue Gabapentin 300 mg TID (ok to continue with 1 cap in am and 2 cap at bedtime).      Relevant Medications   gabapentin (NEURONTIN) 300 MG capsule   HLD (hyperlipidemia) (Chronic)    -Last lipid panel normal, LDL 95. Will repeat lipid pane with annual physical. Discussed a heart healthy diet.        Return for CPE and FBW after 12/21/22.        Lorrene Reid, PA-C  [facility] [phone] (phone) [phone] (fax)  [place]

## 2022-06-22 NOTE — Assessment & Plan Note (Addendum)
-  BP elevated on intake. Repeated with minimal improvement, advised patient to check his BP daily for 1 week and forward BP log especially if readings >140/90 for medication adjustments. Pt verbalized understanding. Will continue Cardura 2 mg at bedtime.

## 2022-08-17 DIAGNOSIS — M25512 Pain in left shoulder: Secondary | ICD-10-CM | POA: Diagnosis not present

## 2022-08-24 ENCOUNTER — Other Ambulatory Visit: Payer: Self-pay

## 2022-08-24 ENCOUNTER — Telehealth: Payer: Self-pay | Admitting: *Deleted

## 2022-08-24 DIAGNOSIS — N400 Enlarged prostate without lower urinary tract symptoms: Secondary | ICD-10-CM

## 2022-08-24 MED ORDER — DOXAZOSIN MESYLATE 2 MG PO TABS: ORAL_TABLET | ORAL | 0 refills | Status: DC

## 2022-08-24 NOTE — Telephone Encounter (Signed)
Refill has been sent.  °

## 2022-08-24 NOTE — Telephone Encounter (Signed)
Pt called and needs refill on his doxazosin sent into the [facility] on [address].  He has 2 tablets left. He said his pharmacy quit contacting the office and they told him that he needed to contact us. Nitin Mckowen Zimmerman Rumple, CMA

## 2022-09-07 ENCOUNTER — Emergency Department (HOSPITAL_BASED_OUTPATIENT_CLINIC_OR_DEPARTMENT_OTHER): Payer: PPO | Admitting: Radiology

## 2022-09-07 ENCOUNTER — Emergency Department (HOSPITAL_BASED_OUTPATIENT_CLINIC_OR_DEPARTMENT_OTHER)
Admission: EM | Admit: 2022-09-07 | Discharge: 2022-09-07 | Disposition: A | Discharge status: Home / Self Care | Payer: PPO | Attending: Emergency Medicine | Admitting: Emergency Medicine

## 2022-09-07 ENCOUNTER — Other Ambulatory Visit: Payer: Self-pay

## 2022-09-07 DIAGNOSIS — I10 Essential (primary) hypertension: Secondary | ICD-10-CM | POA: Diagnosis not present

## 2022-09-07 DIAGNOSIS — R600 Localized edema: Secondary | ICD-10-CM | POA: Insufficient documentation

## 2022-09-07 DIAGNOSIS — M7989 Other specified soft tissue disorders: Secondary | ICD-10-CM | POA: Insufficient documentation

## 2022-09-07 DIAGNOSIS — R5383 Other fatigue: Secondary | ICD-10-CM | POA: Diagnosis present

## 2022-09-07 DIAGNOSIS — Z7982 Long term (current) use of aspirin: Secondary | ICD-10-CM | POA: Diagnosis not present

## 2022-09-07 DIAGNOSIS — Z79899 Other long term (current) drug therapy: Secondary | ICD-10-CM | POA: Insufficient documentation

## 2022-09-07 DIAGNOSIS — I160 Hypertensive urgency: Secondary | ICD-10-CM | POA: Diagnosis not present

## 2022-09-07 LAB — CBC WITH DIFFERENTIAL/PLATELET
Abs Immature Granulocytes: 0.03 10*3/uL (ref 0.00–0.07)
Basophils Absolute: 0.1 10*3/uL (ref 0.0–0.1)
Basophils Relative: 1 %
Eosinophils Absolute: 0.2 10*3/uL (ref 0.0–0.5)
Eosinophils Relative: 3 %
HCT: 43.5 % (ref 39.0–52.0)
Hemoglobin: 14.5 g/dL (ref 13.0–17.0)
Immature Granulocytes: 0 %
Lymphocytes Relative: 22 %
Lymphs Abs: 1.8 10*3/uL (ref 0.7–4.0)
MCH: 31.9 pg (ref 26.0–34.0)
MCHC: 33.3 g/dL (ref 30.0–36.0)
MCV: 95.8 fL (ref 80.0–100.0)
Monocytes Absolute: 0.7 10*3/uL (ref 0.1–1.0)
Monocytes Relative: 8 %
Neutro Abs: 5.2 10*3/uL (ref 1.7–7.7)
Neutrophils Relative %: 66 %
Platelets: 522 10*3/uL — ABNORMAL HIGH (ref 150–400)
RBC: 4.54 MIL/uL (ref 4.22–5.81)
RDW: 13.7 % (ref 11.5–15.5)
WBC: 8 10*3/uL (ref 4.0–10.5)
nRBC: 0 % (ref 0.0–0.2)

## 2022-09-07 LAB — COMPREHENSIVE METABOLIC PANEL
ALT: 13 U/L (ref 0–44)
AST: 12 U/L — ABNORMAL LOW (ref 15–41)
Albumin: 3.5 g/dL (ref 3.5–5.0)
Alkaline Phosphatase: 42 U/L (ref 38–126)
Anion gap: 5 (ref 5–15)
BUN: 17 mg/dL (ref 8–23)
CO2: 30 mmol/L (ref 22–32)
Calcium: 9 mg/dL (ref 8.9–10.3)
Chloride: 107 mmol/L (ref 98–111)
Creatinine, Ser: 0.86 mg/dL (ref 0.61–1.24)
GFR, Estimated: >60 mL/min (ref >60)
Glucose, Bld: 101 mg/dL — ABNORMAL HIGH (ref 70–99)
Potassium: 4.2 mmol/L (ref 3.5–5.1)
Sodium: 142 mmol/L (ref 135–145)
Total Bilirubin: 0.8 mg/dL (ref 0.3–1.2)
Total Protein: 5 g/dL — ABNORMAL LOW (ref 6.5–8.1)

## 2022-09-07 LAB — BRAIN NATRIURETIC PEPTIDE: B Natriuretic Peptide: 252.1 pg/mL — ABNORMAL HIGH (ref 0.0–100.0)

## 2022-09-07 MED ORDER — HYDROCHLOROTHIAZIDE 12.5 MG PO TABS: 12.5000 mg | ORAL_TABLET | Freq: Every day | ORAL | 0 refills | Status: DC

## 2022-09-07 NOTE — ED Triage Notes (Signed)
In for eval of high blood pressure. Denies headache, chest pain, abd pain. Does not take BP medication. Only new med is Meloxicam.

## 2022-09-07 NOTE — Discharge Instructions (Addendum)
You are being started on it antihypertension medicine called hydrochlorothiazide.  You should also follow-up with your primary care physician and you are being referred to a cardiologist.  If you develop chest pain, trouble breathing, headache, or any other new/concerning symptoms then return to the ER for evaluation.

## 2022-09-07 NOTE — ED Provider Notes (Signed)
[facility] Provider Note   CSN: [id number] Arrival date & time: 09/07/22  0915     History  Chief Complaint  Patient presents with   Hypertension    Larry Harris is a 81 y.o. male.  HPI 81 year old male presents with hypertension. He checks his BP regularly.  For the past 1 week he has been noticing increasing blood pressures.  Typically he runs in the 130-140 range.  Diastolic is around 70-26.  Over the last 1 week they have been around 378 systolic and at times up to 180.  He feels fatigued compared to baseline but no other new symptoms.  However he has noticed for the past 10 days he has been having a little bit of swelling to his bilateral lower extremities.  He has had some indentation from his socks.  No headache, chest pain, shortness of breath, palpitations or dizziness.  No focal weakness or numbness.  He is on Cardura for his prostate but nothing else specific for blood pressure.  He normally walks about a mile a day and has been able to do this without any significant difficulty.  No orthopnea.  Home Medications Prior to Admission medications   Medication Sig Start Date End Date Taking? Authorizing Provider  aspirin 325 MG tablet Take 325 mg by mouth daily.   Yes [provider]  b complex vitamins tablet Take 1 tablet by mouth daily.   Yes [provider]  calcium carbonate (OS-CAL) 600 MG TABS Take 600 mg by mouth daily.   Yes [provider]  diphenhydrAMINE (BENADRYL) 25 mg capsule Take 25 mg by mouth as needed (insomnia).   Yes [provider]  doxazosin (CARDURA) 2 MG tablet TAKE 1 TABLET BY MOUTH EVERYDAY AT BEDTIME 08/24/22  Yes Boscia, Heather E, NP  fluticasone (FLONASE) 50 MCG/ACT nasal spray SPRAY 2 SPRAYS INTO EACH NOSTRIL EVERY DAY Patient taking differently: 2 sprays as needed. 07/26/21  Yes Abonza, Maritza, PA-C  gabapentin (NEURONTIN) 300 MG capsule TAKE 1 CAPSULE BY MOUTH THREE TIMES A  DAY 06/22/22  Yes Abonza, Maritza, PA-C  hydrochlorothiazide (HYDRODIURIL) 12.5 MG tablet Take 1 tablet (12.5 mg total) by mouth daily. 09/07/22  Yes Sherwood Gambler, MD  hydrocortisone (ANUSOL-HC) 25 MG suppository Place 1 suppository (25 mg total) rectally 2 (two) times daily. 05/24/21  Yes Abonza, Maritza, PA-C  meloxicam (MOBIC) 15 MG tablet Take 15 mg by mouth daily. 08/17/22  Yes [provider]  Multiple Vitamin (MULTIVITAMIN) capsule Take 1 capsule by mouth daily.   Yes [provider]  vitamin B-12 (CYANOCOBALAMIN) 1000 MCG tablet Take 1,000 mcg by mouth daily.   Yes [provider]  VITAMIN D, CHOLECALCIFEROL, PO Take 1,000 Units by mouth daily.   Yes [provider]      Allergies    Patient has no known allergies.    Review of Systems   Review of Systems  Constitutional:  Positive for fatigue.  Eyes:  Negative for visual disturbance.  Respiratory:  Negative for shortness of breath.   Cardiovascular:  Positive for leg swelling. Negative for chest pain and palpitations.  Gastrointestinal:  Negative for abdominal pain.  Neurological:  Negative for dizziness, weakness, light-headedness, numbness and headaches.    Physical Exam Updated Vital Signs BP (!) 172/68   Pulse 66   Temp 97.8 F (36.6 C) (Oral)   Resp 16   Ht 6' (1.829 m)   Wt 77.1 kg   SpO2 98%  BMI 23.06 kg/m  Physical Exam Vitals and nursing note reviewed.  Constitutional:      General: He is not in acute distress.    Appearance: He is well-developed. He is not ill-appearing or diaphoretic.  HENT:     Head: Normocephalic and atraumatic.  Cardiovascular:     Rate and Rhythm: Normal rate and regular rhythm.     Heart sounds: Normal heart sounds.     Comments: Occasional PVCs Pulmonary:     Effort: Pulmonary effort is normal.     Breath sounds: Normal breath sounds.  Abdominal:     Palpations: Abdomen is soft.     Tenderness: There is no abdominal tenderness.   Musculoskeletal:     Comments: There is some mild indentation in lower legs from socks. Otherwise no significant swelling/pitting edema  Skin:    General: Skin is warm and dry.  Neurological:     Mental Status: He is alert.     ED Results / Procedures / Treatments   Labs (all labs ordered are listed, but only abnormal results are displayed) Labs Reviewed  COMPREHENSIVE METABOLIC PANEL - Abnormal; Notable for the following components:      Result Value   Glucose, Bld 101 (*)    Total Protein 5.0 (*)    AST 12 (*)    All other components within normal limits  BRAIN NATRIURETIC PEPTIDE - Abnormal; Notable for the following components:   B Natriuretic Peptide 252.1 (*)    All other components within normal limits  CBC WITH DIFFERENTIAL/PLATELET - Abnormal; Notable for the following components:   Platelets 522 (*)    All other components within normal limits    EKG EKG Interpretation  Date/Time:  Wednesday September 07 2022 09:29:45 EST Ventricular Rate:  70 PR Interval:  148 QRS Duration: 149 QT Interval:  432 QTC Calculation: 467 R Axis:   214 Text Interpretation: Sinus rhythm Multiform ventricular premature complexes Probable left atrial enlargement Right bundle branch block besides PVCs, similar to 2020 Confirmed by Sherwood Gambler [phone]) on 09/07/2022 9:43:11 AM  Radiology DG Chest 2 View  Result Date: 09/07/2022 CLINICAL DATA:  Leg swelling EXAM: CHEST - 2 VIEW COMPARISON:  09/04/2019 FINDINGS: The heart size and mediastinal contours are within normal limits. Both lungs are clear. The visualized skeletal structures are unremarkable. IMPRESSION: No active cardiopulmonary disease. Electronically Signed   By: Davina Poke D.O.   On: 09/07/2022 11:04    Procedures Procedures    Medications Ordered in ED Medications - No data to display  ED Course/ Medical Decision Making/ A&P                           Medical Decision Making Amount and/or Complexity of Data  Reviewed Labs: ordered.    Details: No significant electrolyte disturbance or abnormal kidney function.  Equivocal BNP at 250. Radiology: ordered and independent interpretation performed.    Details: No CHF ECG/medicine tests: ordered and independent interpretation performed.    Details: No acute ischemia.  Risk Prescription drug management.   Patient presents with hypertensive urgency.  No signs/symptoms of endorgan damage save for questionable BNP.  He does have some trace lower extremity edema but no CHF orthopnea.  Given all this and his overall well appearance, I think is reasonable to put him on a small dose of hydrochlorothiazide and he might get a little bit of extra diuretic effect from this for his leg swelling.  I do  not think he needs to be emergently put on Lasix and think he should probably have an outpatient cardiology eval and probably echo first.  He is otherwise well-appearing.  His exam is unremarkable.  We discussed return precautions but at this point appears stable to follow-up with PCP and cardiology.        Final Clinical Impression(s) / ED Diagnoses Final diagnoses:  Hypertensive urgency    Rx / DC Orders ED Discharge Orders          Ordered    Ambulatory referral to Cardiology       Comments: If you have not heard from the Cardiology office within the next 72 hours please call [phone].   09/07/22 1144    hydrochlorothiazide (HYDRODIURIL) 12.5 MG tablet  Daily        09/07/22 1145              Sherwood Gambler, MD 09/07/22 1318

## 2022-09-07 NOTE — ED Notes (Signed)
Discharge paperwork given and verbally understood. 

## 2022-09-21 NOTE — Progress Notes (Signed)
Cardiology Office Note:    Date:  09/29/2022   ID:  Larry Harris, Larry Harris 1941/05/19, MRN [id number]  PCP:  Lorrene Reid, PA-C  Cardiologist:  Buford Dresser, MD  Referring MD: Lorrene Reid, PA-C   CC: Post-hospital evaluation for hypertensive urgency   History of Present Illness:    Larry Harris is a 80 y.o. male with a hx of CAD, hypertension, hypotension, hyperlipidemia, thrombocytopenia, osteoarthritis, BPH, and skin cancer, who is seen as a new consult at the request of Lorrene Reid, PA-C for the evaluation and management of hypertensive urgency.  Previously seen by Dr. Burt Knack 04/2011. Initially he had developed neck and ear pain in the setting of a fever. EKG performed in the ED demonstrated inferolateral ST segment abnormality worrisome for ischemia. No chest pain or pressure. A nuclear stress test was ordered and was normal.  On 09/07/2022 he was seen in the ED with increasing blood pressures over 1 week (172/68 in ER). Normally around 130-140/60-70, his BP had ranged 109-323 systolic at home. He also complained of new LE edema and fatigue. EKG revealed sinus rhythm at 70 bpm, multiform ventricular premature complexes, probable left atrial enlargement, right bundle branch block, besides PVCs similar to 2020. No signs/symptoms of endorgan damage save for questionable BNP. He was started on a small dose of hydrochlorothiazide for extra diuretic effect for his leg swelling. It was felt he did not need to be put on Lasix emergently. He was referred for outpatient cardiology follow-up and possible echocardiogram.  Today, he is accompanied by his daughter.  Cardiovascular risk factors: Prior clinical ASCVD: none Comorbid conditions: Hypertension - He notes that his increasing blood pressure has been gradual over years. One morning he had noticed his BP was almost to 557 systolic. Lately his blood pressures have been ranging 322G-URK 270W systolic. His regimen has  included doxazosin and gabapentin for a long time. Aspirin 325 mg started 20+ years ago.  Exercise level: Typically he is an active person. For exercise he walks at least 1/2 a mile every day. He averages walking 2 miles throughout his daily activities. 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 Previously had a mechanical fall with a minor injury to his left shoulder. Current diet: He states that he takes a lot of supplemental vitamins.  He complains of swelling in his BLE which is a relatively new symptom. This wasn't noticeable as much in the summer time as he wore lower socks. He also feels very fatigued and notes that his legs feel heavy with walking. This may be related to known sciatic issues.  He denies any palpitations, or shortness of breath. No lightheadedness, headaches, syncope, orthopnea, or PND.  Past Medical History:  Diagnosis Date   Acute rhinitis    Allergic rhinitis, cause unspecified    Allergy    BPH (benign prostatic hyperplasia)    CAD (coronary artery disease)    Cancer (HCC)    scalp basal cell skin ca   Cataract    bilateral cateracts removed   Clotting disorder (HCC)    Hypercholesteremia    Hypotension    Neuralgia, neuritis, and radiculitis, unspecified    Osteoarthritis    Spinal stenosis of lumbar region 06/04/2014   Thrombocytopenia, unspecified ([facility])     Past Surgical History:  Procedure Laterality Date   APPENDECTOMY  1958   bilateral cateracts removed     CHOLECYSTECTOMY N/A    [facility] 12/13/2020   COLONOSCOPY     EYE SURGERY N/A  [facility] 12/13/2020   HERNIA REPAIR  1965   x2    Current Medications: Current Outpatient Medications on File Prior to Visit  Medication Sig   aspirin 325 MG tablet Take 325 mg by mouth daily.   b complex vitamins tablet Take 1 tablet by mouth daily.   calcium carbonate (OS-CAL) 600 MG TABS Take 600 mg by mouth daily.   diphenhydrAMINE (BENADRYL) 25 mg capsule Take 25 mg by mouth as needed  (insomnia).   doxazosin (CARDURA) 2 MG tablet TAKE 1 TABLET BY MOUTH EVERYDAY AT BEDTIME   fluticasone (FLONASE) 50 MCG/ACT nasal spray SPRAY 2 SPRAYS INTO EACH NOSTRIL EVERY DAY (Patient taking differently: 2 sprays as needed.)   gabapentin (NEURONTIN) 300 MG capsule TAKE 1 CAPSULE BY MOUTH THREE TIMES A DAY   hydrochlorothiazide (HYDRODIURIL) 12.5 MG tablet Take 1 tablet (12.5 mg total) by mouth daily.   hydrocortisone (ANUSOL-HC) 25 MG suppository Place 1 suppository (25 mg total) rectally 2 (two) times daily.   meloxicam (MOBIC) 15 MG tablet Take 15 mg by mouth daily.   Multiple Vitamin (MULTIVITAMIN) capsule Take 1 capsule by mouth daily.   vitamin B-12 (CYANOCOBALAMIN) 1000 MCG tablet Take 1,000 mcg by mouth daily.   VITAMIN D, CHOLECALCIFEROL, PO Take 1,000 Units by mouth daily.   No current facility-administered medications on file prior to visit.     Allergies:   Patient has no known allergies.   Social History   Tobacco Use   Smoking status: Former    Packs/day: 1.00    Years: 25.00    Total pack years: 25.00    Types: Cigarettes    Quit date: 07/27/1994    Years since quitting: 28.1   Smokeless tobacco: Never  Vaping Use   Vaping Use: Never used  Substance Use Topics   Alcohol use: Yes    Alcohol/week: 1.0 standard drink of alcohol    Types: 1 Glasses of wine per week    Comment: occasional   Drug use: No    Family History: family history includes Heart failure (age of onset: 22) in his brother; Lung cancer in his sister; Prostate cancer (age of onset: 72) in his father; Stroke (age of onset: 41) in his mother. There is no history of Colon cancer, Esophageal cancer, Rectal cancer, or Stomach cancer.  ROS:   Please see the history of present illness.  Additional pertinent ROS: Constitutional: Negative for chills, fever, night sweats, unintentional weight loss. Positive for fatigue.  HENT: Negative for ear pain and hearing loss.   Eyes: Negative for loss of vision  and eye pain.  Respiratory: Negative for cough, sputum, wheezing.   Cardiovascular: See HPI. Gastrointestinal: Negative for abdominal pain, melena, and hematochezia.  Genitourinary: Negative for dysuria and hematuria.  Musculoskeletal: Negative for myalgias.  Skin: Negative for itching and rash.  Neurological: Negative for focal weakness, and loss of consciousness. Positive for BLE heaviness.  Endo/Heme/Allergies: Does not bruise/bleed easily.     EKGs/Labs/Other Studies Reviewed:    The following studies were reviewed today:  RLE Venous Doppler  07/09/2021: IMPRESSION: No evidence of deep venous thrombosis.  CTA Chest/Abd/Pel  09/04/2019: IMPRESSION: 1. No evidence of aortic dissection or other acute vascular findings. 2. Atherosclerosis and vascular anatomic variants as described without significant stenosis. 3. Enhancing 2.1 x 1.4 cm exophytic right thyroid nodule. Recommend thyroid US.(Ref: J Am Coll Radiol. 2015 Feb;12(2): 143-50). 4. Indeterminate bilateral adrenal nodules, statistically adenomas. Consider noncontrast follow-up abdominal CT in 6-12 months to better characterize. 5.  Prostatomegaly with mild diffuse bladder wall thickening. 6. Aortic Atherosclerosis (ICD10-I70.0) and Emphysema (ICD10-J43.9).  Aorta Ultrasound  03/07/2018: FINDINGS: Abdominal aortic measurements as follows:   Proximal:  2.6 x 2.8 cm   Mid:  1.8 x 2.3 cm   Distal:  1.8 x 2.1 cm   No periaortic fluid or adenopathy. No aneurysmal dilatation of either common iliac artery. Peak systolic velocity in the abdominal aorta is 33.8 centimeters/second.  IMPRESSION: Maximum transverse diameter of the aorta is measured at 2.6 x 2.8 cm. Ectatic abdominal aorta at risk for aneurysm development. Recommend followup by ultrasound in 5 years. This recommendation follows ACR consensus guidelines: White Paper of the ACR Incidental Findings Committee II on Vascular Findings. J Am Coll Radiol  2013; 10:789-794.   Study otherwise unremarkable.  Prior nuclear stress 04/2011, AAA ultrasound 12/2014.  EKG:  EKG is personally reviewed.   09/29/2022:  not ordered today 09/07/22: SR with PVCs, RBBB, 70 bpm  Recent Labs: 12/13/2021: TSH 3.120 09/07/2022: ALT 13; B Natriuretic Peptide 252.1; BUN 17; Creatinine, Ser 0.86; Hemoglobin 14.5; Platelets 522; Potassium 4.2; Sodium 142   Recent Lipid Panel    Component Value Date/Time   CHOL 156 12/13/2021 0851   TRIG 79 12/13/2021 0851   HDL 46 12/13/2021 0851   CHOLHDL 3.4 12/13/2021 0851   CHOLHDL 3.4 07/25/2016 0937   VLDL 13 07/25/2016 0937   LDLCALC 95 12/13/2021 0851    Physical Exam:    VS:  BP (!) 142/70 (BP Location: Right Arm, Patient Position: Sitting, Cuff Size: Normal)   Pulse 75   Ht 6' (1.829 m)   Wt 179 lb 1.6 oz (81.2 kg)   SpO2 98%   BMI 24.29 kg/m     Wt Readings from Last 3 Encounters:  09/29/22 179 lb 1.6 oz (81.2 kg)  09/07/22 170 lb (77.1 kg)  06/22/22 183 lb (83 kg)    GEN: Well nourished, well developed in no acute distress HEENT: Normal, moist mucous membranes NECK: No JVD CARDIAC: regular rhythm, normal S1 and S2, no rubs or gallops. No murmur. VASCULAR: Radial and DP pulses 2+ bilaterally. No carotid bruits RESPIRATORY:  Clear to auscultation without rales, wheezing or rhonchi  ABDOMEN: Soft, non-tender, non-distended MUSCULOSKELETAL:  Ambulates independently SKIN: Warm and dry, trivial bl LE edema NEUROLOGIC:  Alert and oriented x 3. No focal neuro deficits noted. PSYCHIATRIC:  Normal affect    ASSESSMENT:    1. Bilateral leg edema   2. Essential hypertension   3. Other fatigue   4. Aortic atherosclerosis ([facility])   5. Therapeutic drug monitoring   6. Cardiac risk counseling    PLAN:    Bilateral LE edema Fatigue -check echocardiogram  Hypertension -on HCTZ since ER visit, check BMET -not at goal today. Based on BMET, echo, will plan to adjust medication (if EF reduced, will need  GDMT)  Aortic atherosclerosis -on high dose aspirin for noncardiac reasons, tolerating -discussed statin recommendations. He prefers to manage without medication  Cardiac risk counseling and prevention recommendations: -recommend heart healthy/Mediterranean diet, with whole grains, fruits, vegetable, fish, lean meats, nuts, and olive oil. Limit salt. -recommend moderate walking, 3-5 times/week for 30-50 minutes each session. Aim for at least 150 minutes.week. Goal should be pace of 3 miles/hours, or walking 1.5 miles in 30 minutes -recommend avoidance of tobacco products. Avoid excess alcohol.  Plan for follow up: TBD based on results of testing, or sooner as needed.  Buford Dresser, MD, PhD, [place]  Medication Adjustments/Labs and Tests Ordered: Current medicines are reviewed at length with the patient today.  Concerns regarding medicines are outlined above.   Orders Placed This Encounter  Procedures   Basic metabolic panel   ECHOCARDIOGRAM COMPLETE   No orders of the defined types were placed in this encounter.  Patient Instructions  Medication Instructions:  Your physician recommends that you continue on your current medications as directed. Please refer to the Current Medication list given to you today.   *If you need a refill on your cardiac medications before your next appointment, please call your pharmacy*  Lab Work: BMET TODAY   If you have labs (blood work) drawn today and your tests are completely normal, you will receive your results only by: [facility] (if you have MyChart) OR A paper copy in the mail If you have any lab test that is abnormal or we need to change your treatment, we will call you to review the results.  Testing/Procedures: Your physician has requested that you have an echocardiogram. Echocardiography is a painless test that uses sound waves to create images of your heart. It provides your doctor with  information about the size and shape of your heart and how well your heart's chambers and valves are working. This procedure takes approximately one hour. There are no restrictions for this procedure. Please do NOT wear cologne, perfume, aftershave, or lotions (deodorant is allowed). Please arrive 15 minutes prior to your appointment time.   Follow-Up: At [facility], you and your health needs are our priority.  As part of our continuing mission to provide you with exceptional heart care, we have created designated Provider Care Teams.  These Care Teams include your primary Cardiologist (physician) and Advanced Practice Providers (APPs -  Physician Assistants and Nurse Practitioners) who all work together to provide you with the care you need, when you need it.  We recommend signing up for the patient portal called "MyChart".  Sign up information is provided on this After Visit Summary.  MyChart is used to connect with patients for Virtual Visits (Telemedicine).  Patients are able to view lab/test results, encounter notes, upcoming appointments, etc.  Non-urgent messages can be sent to your provider as well.   To learn more about what you can do with MyChart, go to [email or web address].    Your next appointment:   TO BE DETERMINED AFTER ECHO           I,Mathew Stumpf,acting as a scribe for [employer], MD.,have documented all relevant documentation on the behalf of Buford Dresser, MD,as directed by  Buford Dresser, MD while in the presence of Buford Dresser, MD.  I, Buford Dresser, MD, have reviewed all documentation for this visit. The documentation on 10/03/22 for the exam, diagnosis, procedures, and orders are all accurate and complete.   Signed, Buford Dresser, MD PhD 09/29/2022     [place]

## 2022-09-27 ENCOUNTER — Telehealth: Payer: Self-pay

## 2022-09-27 DIAGNOSIS — M48062 Spinal stenosis, lumbar region with neurogenic claudication: Secondary | ICD-10-CM

## 2022-09-27 MED ORDER — GABAPENTIN 300 MG PO CAPS: ORAL_CAPSULE | ORAL | 0 refills | Status: DC

## 2022-09-27 NOTE — Telephone Encounter (Signed)
Refill sent.

## 2022-09-27 NOTE — Telephone Encounter (Signed)
Pt stopped by the office requesting a 90 day  refill on: gabapentin (NEURONTIN) 300 MG capsule    Ph[facility]: [id number] [place]    LOV 06/22/22 ROV 12/26/22

## 2022-09-29 ENCOUNTER — Encounter (HOSPITAL_BASED_OUTPATIENT_CLINIC_OR_DEPARTMENT_OTHER): Payer: Self-pay | Admitting: Cardiology

## 2022-09-29 ENCOUNTER — Ambulatory Visit (INDEPENDENT_AMBULATORY_CARE_PROVIDER_SITE_OTHER): Payer: PPO | Admitting: Cardiology

## 2022-09-29 VITALS — BP 142/70 | HR 75 | Ht 72.0 in | Wt 179.1 lb

## 2022-09-29 DIAGNOSIS — R600 Localized edema: Secondary | ICD-10-CM

## 2022-09-29 DIAGNOSIS — R5383 Other fatigue: Secondary | ICD-10-CM | POA: Diagnosis not present

## 2022-09-29 DIAGNOSIS — Z5181 Encounter for therapeutic drug level monitoring: Secondary | ICD-10-CM

## 2022-09-29 DIAGNOSIS — I10 Essential (primary) hypertension: Secondary | ICD-10-CM

## 2022-09-29 DIAGNOSIS — I700 Atherosclerosis of aorta: Secondary | ICD-10-CM

## 2022-09-29 DIAGNOSIS — Z7189 Other specified counseling: Secondary | ICD-10-CM | POA: Diagnosis not present

## 2022-09-29 LAB — BASIC METABOLIC PANEL
BUN/Creatinine Ratio: 17 (ref 10–24)
BUN: 17 mg/dL (ref 8–27)
CO2: 28 mmol/L (ref 20–29)
Calcium: 9.5 mg/dL (ref 8.6–10.2)
Chloride: 102 mmol/L (ref 96–106)
Creatinine, Ser: 1.02 mg/dL (ref 0.76–1.27)
Glucose: 72 mg/dL (ref 70–99)
Potassium: 4.4 mmol/L (ref 3.5–5.2)
Sodium: 142 mmol/L (ref 134–144)
eGFR: 73 mL/min/{1.73_m2} (ref >59)

## 2022-09-29 NOTE — Patient Instructions (Signed)
Medication Instructions:  Your physician recommends that you continue on your current medications as directed. Please refer to the Current Medication list given to you today.   *If you need a refill on your cardiac medications before your next appointment, please call your pharmacy*  Lab Work: BMET TODAY   If you have labs (blood work) drawn today and your tests are completely normal, you will receive your results only by: [place] (if you have MyChart) OR A paper copy in the mail If you have any lab test that is abnormal or we need to change your treatment, we will call you to review the results.  Testing/Procedures: Your physician has requested that you have an echocardiogram. Echocardiography is a painless test that uses sound waves to create images of your heart. It provides your doctor with information about the size and shape of your heart and how well your heart's chambers and valves are working. This procedure takes approximately one hour. There are no restrictions for this procedure. Please do NOT wear cologne, perfume, aftershave, or lotions (deodorant is allowed). Please arrive 15 minutes prior to your appointment time.   Follow-Up: At [facility], you and your health needs are our priority.  As part of our continuing mission to provide you with exceptional heart care, we have created designated Provider Care Teams.  These Care Teams include your primary Cardiologist (physician) and Advanced Practice Providers (APPs -  Physician Assistants and Nurse Practitioners) who all work together to provide you with the care you need, when you need it.  We recommend signing up for the patient portal called "MyChart".  Sign up information is provided on this After Visit Summary.  MyChart is used to connect with patients for Virtual Visits (Telemedicine).  Patients are able to view lab/test results, encounter notes, upcoming appointments, etc.  Non-urgent messages can be sent to  your provider as well.   To learn more about what you can do with MyChart, go to [email or web address].    Your next appointment:   TO BE DETERMINED AFTER ECHO

## 2022-10-03 ENCOUNTER — Encounter (HOSPITAL_BASED_OUTPATIENT_CLINIC_OR_DEPARTMENT_OTHER): Payer: Self-pay | Admitting: Cardiology

## 2022-10-03 ENCOUNTER — Telehealth (HOSPITAL_BASED_OUTPATIENT_CLINIC_OR_DEPARTMENT_OTHER): Payer: Self-pay | Admitting: Cardiology

## 2022-10-03 MED ORDER — HYDROCHLOROTHIAZIDE 12.5 MG PO TABS: 12.5000 mg | ORAL_TABLET | Freq: Every day | ORAL | 6 refills | Status: DC

## 2022-10-03 NOTE — Telephone Encounter (Signed)
Patient saw Dr. Harrell Gave last week and was told to continue his Hydrocholothiazide 12.'5mg'$   he needs a refill----[facility] [address]

## 2022-10-03 NOTE — Telephone Encounter (Signed)
Pt notified refill sent to his pharmacy as requested. Georgana Curio MHA RN CCM

## 2022-10-11 DIAGNOSIS — Z961 Presence of intraocular lens: Secondary | ICD-10-CM | POA: Diagnosis not present

## 2022-10-11 DIAGNOSIS — H26492 Other secondary cataract, left eye: Secondary | ICD-10-CM | POA: Diagnosis not present

## 2022-10-11 DIAGNOSIS — H5211 Myopia, right eye: Secondary | ICD-10-CM | POA: Diagnosis not present

## 2022-10-11 DIAGNOSIS — H43813 Vitreous degeneration, bilateral: Secondary | ICD-10-CM | POA: Diagnosis not present

## 2022-10-12 ENCOUNTER — Telehealth: Payer: Self-pay

## 2022-10-12 DIAGNOSIS — K649 Unspecified hemorrhoids: Secondary | ICD-10-CM

## 2022-10-12 NOTE — Telephone Encounter (Signed)
Pt is requesting a refill on: hydrocortisone (ANUSOL-HC) 25 MG suppository   [facility] [id number] [place]   LOV 06/22/22 ROV 12/26/22

## 2022-10-13 MED ORDER — HYDROCORTISONE ACETATE 25 MG RE SUPP: 25.0000 mg | Freq: Two times a day (BID) | RECTAL | 0 refills | Status: DC

## 2022-10-13 NOTE — Telephone Encounter (Signed)
Refill sent.

## 2022-10-18 ENCOUNTER — Ambulatory Visit (HOSPITAL_BASED_OUTPATIENT_CLINIC_OR_DEPARTMENT_OTHER): Payer: PPO

## 2022-10-18 DIAGNOSIS — R600 Localized edema: Secondary | ICD-10-CM

## 2022-10-18 LAB — ECHOCARDIOGRAM COMPLETE
Area-P 1/2: 3.99 cm2
MV M vel: 3.19 m/s
MV Peak grad: 40.7 mmHg
S' Lateral: 2.97 cm

## 2022-10-28 ENCOUNTER — Telehealth: Payer: Self-pay | Admitting: Cardiology

## 2022-10-28 NOTE — Telephone Encounter (Signed)
Pt would like a callback from nurse to discuss Echo results. Please advise

## 2022-11-03 NOTE — Telephone Encounter (Signed)
Echo results available.   TY!

## 2022-11-04 ENCOUNTER — Telehealth (HOSPITAL_BASED_OUTPATIENT_CLINIC_OR_DEPARTMENT_OTHER): Payer: Self-pay

## 2022-11-04 NOTE — Telephone Encounter (Signed)
Results called to patient who verbalizes understanding! Scheduled patient for follow up!          ----- Message from Loel Dubonnet, NP sent at 11/03/2022  9:52 PM EST ----- Echocardiogram with normal heart muscle function. Mild thickening of heart muscle. No significant valvular abnormalities. Aortic root mildly dilated 77m. Recommend repeat echocardiogram in 1 year. No significant abnormalities that would cause lower extremity edema or fatigue. However, given wall motion abnormalities would recommend follow up with Dr. CHarrell Gavewithin the next 4-6 weeks to re-evaluate symptoms and consider if ischemic evaluation needed.

## 2022-11-04 NOTE — Telephone Encounter (Addendum)
Left message for patient to call back     ----- Message from Loel Dubonnet, NP sent at 11/03/2022  9:52 PM EST ----- Echocardiogram with normal heart muscle function. Mild thickening of heart muscle. No significant valvular abnormalities. Aortic root mildly dilated 81m. Recommend repeat echocardiogram in 1 year. No significant abnormalities that would cause lower extremity edema or fatigue. However, given wall motion abnormalities would recommend follow up with Dr. CHarrell Gavewithin the next 4-6 weeks to re-evaluate symptoms and consider if ischemic evaluation needed.

## 2022-11-18 ENCOUNTER — Other Ambulatory Visit: Payer: Self-pay | Admitting: Nurse Practitioner

## 2022-11-18 DIAGNOSIS — N400 Enlarged prostate without lower urinary tract symptoms: Secondary | ICD-10-CM

## 2022-11-28 ENCOUNTER — Ambulatory Visit (HOSPITAL_BASED_OUTPATIENT_CLINIC_OR_DEPARTMENT_OTHER): Payer: PPO | Admitting: Cardiology

## 2022-12-02 NOTE — Progress Notes (Signed)
Cardiology Office Note:    Date:  12/18/2022   ID:  Larry Harris 1940-12-02, MRN [id number]  PCP:  Larry Reid, PA-C (Inactive)  Cardiologist:  Larry Dresser, MD  Referring MD: Larry Reid, PA-C   CC: Follow-up  History of Present Illness:    Larry Harris is a 82 y.o. male with a hx of CAD, hypertension, hypotension, hyperlipidemia, thrombocytopenia, osteoarthritis, BPH, and skin cancer, who is seen for follow-up today. He was initially seen 09/29/2022 as a new consult at the request of Larry Reid, PA-C for the evaluation and management of hypertensive urgency.  Previously seen by Dr. Burt Harris 04/2011. Initially he had developed neck and ear pain in the setting of a fever. EKG performed in the ED demonstrated inferolateral ST segment abnormality worrisome for ischemia. No chest pain or pressure. A nuclear stress test was ordered and was normal.  On 09/07/2022 he was seen in the ED with increasing blood pressures over 1 week (172/68 in ER). Normally around 130-140/60-70, his BP had ranged 0000000 systolic at home. He also complained of new LE edema and fatigue. EKG revealed sinus rhythm at 70 bpm, multiform ventricular premature complexes, probable left atrial enlargement, right bundle branch block, besides PVCs similar to 2020. No signs/symptoms of endorgan damage save for questionable BNP. He was started on a small dose of hydrochlorothiazide for extra diuretic effect for his leg swelling. It was felt he did not need to be put on Lasix emergently. He was referred for outpatient cardiology follow-up and possible echocardiogram.  Cardiovascular risk factors: Prior clinical ASCVD: none Comorbid conditions: Hypertension - He notes that his increasing blood pressure has been gradual over years. One morning he had noticed his BP was almost to [id number] systolic. Lately his blood pressures have been ranging [id number] systolic. His regimen has included doxazosin and  gabapentin for a long time. Aspirin 325 mg started 20+ years ago.  Exercise level: Typically he is an active person. For exercise he walks at least 1/2 a mile every day. He averages walking 2 miles throughout his daily activities. 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 Previously had a mechanical fall with a minor injury to his left shoulder. Current diet: He states that he takes a lot of supplemental vitamins.  At his initial appointment he complained of relatively new BLE edema. This wasn't noticeable as much in the summer time as he wore lower socks. He also felt very fatigued and noted heaviness in his legs with walking. He also had known sciatic issues. We discussed statin recommendations. He preferred to manage without medication. He had an echocardiogram 10/18/2022 revealing LVEF 60-65%, mild concentric LVH, and normal diastolic parameters. Noted aortic dilatation measuring 40 mm.  Today, he is generally feeling well. We reviewed his recent echo results at length.  In clinic his blood pressure is 144/78. He confirms that it is usually higher in the office. At home he endorses readings averaging in the high 120s to upper 130s, over mid 60s to low 70s.  He continues to have some BLE edema, which he states is above his sock lines. No new leg pain. He has known LE pain attributable to spinal stenosis. He has been staying active and usually completes 2-2.5 miles of motion in a day.  He denies any palpitations, chest pain, shortness of breath, lightheadedness, headaches, syncope, orthopnea, or PND.   Past Medical History:  Diagnosis Date   Acute rhinitis    Allergic rhinitis, cause unspecified    Allergy  BPH (benign prostatic hyperplasia)    CAD (coronary artery disease)    Cancer (HCC)    scalp basal cell skin ca   Cataract    bilateral cateracts removed   Clotting disorder (HCC)    Hypercholesteremia    Hypotension    Neuralgia, neuritis, and radiculitis, unspecified     Osteoarthritis    Spinal stenosis of lumbar region 06/04/2014   Thrombocytopenia, unspecified ([place])     Past Surgical History:  Procedure Laterality Date   APPENDECTOMY  1958   bilateral cateracts removed     CHOLECYSTECTOMY N/A    [facility] 12/13/2020   COLONOSCOPY     EYE SURGERY N/A    [facility] 12/13/2020   HERNIA REPAIR  1965   x2    Current Medications: Current Outpatient Medications on File Prior to Visit  Medication Sig   aspirin 325 MG tablet Take 325 mg by mouth daily.   b complex vitamins tablet Take 1 tablet by mouth daily.   calcium carbonate (OS-CAL) 600 MG TABS Take 600 mg by mouth daily.   diphenhydrAMINE (BENADRYL) 25 mg capsule Take 25 mg by mouth as needed (insomnia).   doxazosin (CARDURA) 2 MG tablet TAKE 1 TABLET BY MOUTH EVERYDAY AT BEDTIME   fluticasone (FLONASE) 50 MCG/ACT nasal spray SPRAY 2 SPRAYS INTO EACH NOSTRIL EVERY DAY (Patient taking differently: 2 sprays as needed.)   gabapentin (NEURONTIN) 300 MG capsule TAKE 1 CAPSULE BY MOUTH THREE TIMES A DAY   hydrochlorothiazide (HYDRODIURIL) 12.5 MG tablet Take 1 tablet (12.5 mg total) by mouth daily.   hydrocortisone (ANUSOL-HC) 25 MG suppository Place 1 suppository (25 mg total) rectally 2 (two) times daily.   Multiple Vitamin (MULTIVITAMIN) capsule Take 1 capsule by mouth daily.   vitamin B-12 (CYANOCOBALAMIN) 1000 MCG tablet Take 1,000 mcg by mouth daily.   VITAMIN D, CHOLECALCIFEROL, PO Take 1,000 Units by mouth daily.   No current facility-administered medications on file prior to visit.     Allergies:   Patient has no known allergies.   Social History   Tobacco Use   Smoking status: Former    Packs/day: 1.00    Years: 25.00    Additional pack years: 0.00    Total pack years: 25.00    Types: Cigarettes    Quit date: 07/27/1994    Years since quitting: 28.4   Smokeless tobacco: Never  Vaping Use   Vaping Use: Never used  Substance Use Topics   Alcohol use: Yes    Alcohol/week: 1.0  standard drink of alcohol    Types: 1 Glasses of wine per week    Comment: occasional   Drug use: No    Family History: family history includes Heart failure (age of onset: 14) in his brother; Lung cancer in his sister; Prostate cancer (age of onset: 42) in his father; Stroke (age of onset: 79) in his mother. There is no history of Colon cancer, Esophageal cancer, Rectal cancer, or Stomach cancer.  ROS:   Please see the history of present illness. (+) BLE edema All other systems are reviewed and negative.    EKGs/Labs/Other Studies Reviewed:    The following studies were reviewed today:  Echocardiogram  10/18/2022: Sonographer Comments: Post definity 120/60   IMPRESSIONS   1. Left ventricular ejection fraction, by estimation, is 60 to 65%. The  left ventricle has normal function. The left ventricle demonstrates  regional wall motion abnormalities (see scoring diagram/findings for  description). There is mild concentric left  ventricular hypertrophy. Left  ventricular diastolic parameters were  normal.   2. Right ventricular systolic function is normal. The right ventricular  size is normal. Tricuspid regurgitation signal is inadequate for assessing  PA pressure.   3. Left atrial size was mildly dilated.   4. Right atrial size was mildly dilated.   5. The mitral valve is normal in structure. No evidence of mitral valve  regurgitation. No evidence of mitral stenosis.   6. The aortic valve is tricuspid. Aortic valve regurgitation is not  visualized. No aortic stenosis is present.   7. Aortic dilatation noted. There is mild dilatation of the aortic root,  measuring 40 mm.   8. The inferior vena cava is normal in size with greater than 50%  respiratory variability, suggesting right atrial pressure of 3 mmHg.   RLE Venous Doppler  07/09/2021: IMPRESSION: No evidence of deep venous thrombosis.  CTA Chest/Abd/Pel  09/04/2019: IMPRESSION: 1. No evidence of aortic dissection or  other acute vascular findings. 2. Atherosclerosis and vascular anatomic variants as described without significant stenosis. 3. Enhancing 2.1 x 1.4 cm exophytic right thyroid nodule. Recommend thyroid US.(Ref: J Am Coll Radiol. 2015 Feb;12(2): 143-50). 4. Indeterminate bilateral adrenal nodules, statistically adenomas. Consider noncontrast follow-up abdominal CT in 6-12 months to better characterize. 5. Prostatomegaly with mild diffuse bladder wall thickening. 6. Aortic Atherosclerosis (ICD10-I70.0) and Emphysema (ICD10-J43.9).  Aorta Ultrasound  03/07/2018: FINDINGS: Abdominal aortic measurements as follows:   Proximal:  2.6 x 2.8 cm   Mid:  1.8 x 2.3 cm   Distal:  1.8 x 2.1 cm   No periaortic fluid or adenopathy. No aneurysmal dilatation of either common iliac artery. Peak systolic velocity in the abdominal aorta is 33.8 centimeters/second.  IMPRESSION: Maximum transverse diameter of the aorta is measured at 2.6 x 2.8 cm. Ectatic abdominal aorta at risk for aneurysm development. Recommend followup by ultrasound in 5 years. This recommendation follows ACR consensus guidelines: White Paper of the ACR Incidental Findings Committee II on Vascular Findings. J Am Coll Radiol 2013; 10:789-794.   Study otherwise unremarkable.  Prior nuclear stress 04/2011, AAA ultrasound 12/2014.  EKG:  EKG is personally reviewed.   12/05/2022:  EKG was not ordered. 09/29/2022:  not ordered 09/07/22: SR with PVCs, RBBB, 70 bpm  Recent Labs: 09/07/2022: ALT 13; B Natriuretic Peptide 252.1; Hemoglobin 14.5; Platelets 522 09/29/2022: BUN 17; Creatinine, Ser 1.02; Potassium 4.4; Sodium 142   Recent Lipid Panel    Component Value Date/Time   CHOL 156 12/13/2021 0851   TRIG 79 12/13/2021 0851   HDL 46 12/13/2021 0851   CHOLHDL 3.4 12/13/2021 0851   CHOLHDL 3.4 07/25/2016 0937   VLDL 13 07/25/2016 0937   LDLCALC 95 12/13/2021 0851    Physical Exam:    VS:  BP (!) 144/78   Pulse 76   Ht 6'  (1.829 m)   Wt 175 lb (79.4 kg)   BMI 23.73 kg/m     Wt Readings from Last 3 Encounters:  12/05/22 175 lb (79.4 kg)  09/29/22 179 lb 1.6 oz (81.2 kg)  09/07/22 170 lb (77.1 kg)    GEN: Well nourished, well developed in no acute distress HEENT: Normal, moist mucous membranes NECK: No JVD CARDIAC: regular rhythm, normal S1 and S2, no rubs or gallops. No murmur. VASCULAR: Radial and DP pulses 2+ bilaterally. No carotid bruits RESPIRATORY:  Clear to auscultation without rales, wheezing or rhonchi  ABDOMEN: Soft, non-tender, non-distended MUSCULOSKELETAL:  Ambulates independently SKIN: Warm and dry, trivial BLE edema NEUROLOGIC:  Alert and  oriented x 3. No focal neuro deficits noted. PSYCHIATRIC:  Normal affect    ASSESSMENT:    1. Essential hypertension   2. Bilateral leg edema   3. Aortic atherosclerosis ([place])   4. Encounter to discuss test results     PLAN:    Bilateral LE edema Fatigue -reviewed echo today. Normal EF, mild cLVH, normal diastolic function, normal RAP. Suggests edema is noncardiac  Hypertension -on HCTZ since ER visit, reports BP much better controlled at home  Aortic atherosclerosis -on high dose aspirin for noncardiac reasons, tolerating -discussed statin recommendations. He prefers to manage without medication  Also noted mild aortic dilation at 40 mm on echo  Cardiac risk counseling and prevention recommendations: -recommend heart healthy/Mediterranean diet, with whole grains, fruits, vegetable, fish, lean meats, nuts, and olive oil. Limit salt. -recommend moderate walking, 3-5 times/week for 30-50 minutes each session. Aim for at least 150 minutes.week. Goal should be pace of 3 miles/hours, or walking 1.5 miles in 30 minutes -recommend avoidance of tobacco products. Avoid excess alcohol.  Plan for follow up: 6 months, or sooner as needed.  Larry Dresser, MD, PhD, [facility]    Medication Adjustments/Labs and  Tests Ordered: Current medicines are reviewed at length with the patient today.  Concerns regarding medicines are outlined above.   No orders of the defined types were placed in this encounter.  No orders of the defined types were placed in this encounter.  Patient Instructions  Medication Instructions:  Your physician recommends that you continue on your current medications as directed. Please refer to the Current Medication list given to you today.  *If you need a refill on your cardiac medications before your next appointment, please call your pharmacy*  Lab Work: NONE  Testing/Procedures: NONE  Follow-Up: At [facility], you and your health needs are our priority.  As part of our continuing mission to provide you with exceptional heart care, we have created designated Provider Care Teams.  These Care Teams include your primary Cardiologist (physician) and Advanced Practice Providers (APPs -  Physician Assistants and Nurse Practitioners) who all work together to provide you with the care you need, when you need it.  We recommend signing up for the patient portal called "MyChart".  Sign up information is provided on this After Visit Summary.  MyChart is used to connect with patients for Virtual Visits (Telemedicine).  Patients are able to view lab/test results, encounter notes, upcoming appointments, etc.  Non-urgent messages can be sent to your provider as well.   To learn more about what you can do with MyChart, go to [email or web address].    Your next appointment:   6 month(s)  The format for your next appointment:   In Person  Provider:   Buford Dresser, MD       [facility],acting as a scribe for Larry Dresser, MD.,have documented all relevant documentation on the behalf of Larry Dresser, MD,as directed by  Larry Dresser, MD while in the presence of Larry Dresser, MD.  I, Larry Dresser, MD, have reviewed all  documentation for this visit. The documentation on 12/18/22 for the exam, diagnosis, procedures, and orders are all accurate and complete.   Signed, Larry Dresser, MD PhD 12/18/2022     [place]

## 2022-12-05 ENCOUNTER — Encounter (HOSPITAL_BASED_OUTPATIENT_CLINIC_OR_DEPARTMENT_OTHER): Payer: Self-pay | Admitting: Cardiology

## 2022-12-05 ENCOUNTER — Ambulatory Visit (INDEPENDENT_AMBULATORY_CARE_PROVIDER_SITE_OTHER): Payer: PPO | Admitting: Cardiology

## 2022-12-05 VITALS — BP 144/78 | HR 76 | Ht 72.0 in | Wt 175.0 lb

## 2022-12-05 DIAGNOSIS — I700 Atherosclerosis of aorta: Secondary | ICD-10-CM | POA: Diagnosis not present

## 2022-12-05 DIAGNOSIS — Z712 Person consulting for explanation of examination or test findings: Secondary | ICD-10-CM

## 2022-12-05 DIAGNOSIS — I10 Essential (primary) hypertension: Secondary | ICD-10-CM

## 2022-12-05 DIAGNOSIS — R600 Localized edema: Secondary | ICD-10-CM | POA: Diagnosis not present

## 2022-12-05 NOTE — Patient Instructions (Signed)
Medication Instructions:  Your physician recommends that you continue on your current medications as directed. Please refer to the Current Medication list given to you today.   *If you need a refill on your cardiac medications before your next appointment, please call your pharmacy*  Lab Work: NONE  Testing/Procedures: NONE   Follow-Up: At [facility], you and your health needs are our priority.  As part of our continuing mission to provide you with exceptional heart care, we have created designated Provider Care Teams.  These Care Teams include your primary Cardiologist (physician) and Advanced Practice Providers (APPs -  Physician Assistants and Nurse Practitioners) who all work together to provide you with the care you need, when you need it.  We recommend signing up for the patient portal called "MyChart".  Sign up information is provided on this After Visit Summary.  MyChart is used to connect with patients for Virtual Visits (Telemedicine).  Patients are able to view lab/test results, encounter notes, upcoming appointments, etc.  Non-urgent messages can be sent to your provider as well.   To learn more about what you can do with MyChart, go to [email or web address].    Your next appointment:   6 month(s)  The format for your next appointment:   In Person  Provider:   Bridgette Christopher, MD             

## 2022-12-08 DIAGNOSIS — Z85828 Personal history of other malignant neoplasm of skin: Secondary | ICD-10-CM | POA: Diagnosis not present

## 2022-12-08 DIAGNOSIS — D2262 Melanocytic nevi of left upper limb, including shoulder: Secondary | ICD-10-CM | POA: Diagnosis not present

## 2022-12-08 DIAGNOSIS — Z08 Encounter for follow-up examination after completed treatment for malignant neoplasm: Secondary | ICD-10-CM | POA: Diagnosis not present

## 2022-12-08 DIAGNOSIS — L570 Actinic keratosis: Secondary | ICD-10-CM | POA: Diagnosis not present

## 2022-12-08 DIAGNOSIS — D2261 Melanocytic nevi of right upper limb, including shoulder: Secondary | ICD-10-CM | POA: Diagnosis not present

## 2022-12-08 DIAGNOSIS — D225 Melanocytic nevi of trunk: Secondary | ICD-10-CM | POA: Diagnosis not present

## 2022-12-08 DIAGNOSIS — L821 Other seborrheic keratosis: Secondary | ICD-10-CM | POA: Diagnosis not present

## 2022-12-08 DIAGNOSIS — L578 Other skin changes due to chronic exposure to nonionizing radiation: Secondary | ICD-10-CM | POA: Diagnosis not present

## 2022-12-08 DIAGNOSIS — Z872 Personal history of diseases of the skin and subcutaneous tissue: Secondary | ICD-10-CM | POA: Diagnosis not present

## 2022-12-15 ENCOUNTER — Other Ambulatory Visit: Payer: Self-pay

## 2022-12-15 DIAGNOSIS — Z130 Encounter for screening for diseases of the blood and blood-forming organs and certain disorders involving the immune mechanism: Secondary | ICD-10-CM

## 2022-12-15 DIAGNOSIS — Z0000 Encounter for general adult medical examination without abnormal findings: Secondary | ICD-10-CM

## 2022-12-18 ENCOUNTER — Encounter (HOSPITAL_BASED_OUTPATIENT_CLINIC_OR_DEPARTMENT_OTHER): Payer: Self-pay | Admitting: Cardiology

## 2022-12-19 ENCOUNTER — Other Ambulatory Visit: Payer: PPO

## 2022-12-19 DIAGNOSIS — Z130 Encounter for screening for diseases of the blood and blood-forming organs and certain disorders involving the immune mechanism: Secondary | ICD-10-CM | POA: Diagnosis not present

## 2022-12-19 DIAGNOSIS — Z0000 Encounter for general adult medical examination without abnormal findings: Secondary | ICD-10-CM

## 2022-12-19 DIAGNOSIS — Z1329 Encounter for screening for other suspected endocrine disorder: Secondary | ICD-10-CM | POA: Diagnosis not present

## 2022-12-19 DIAGNOSIS — Z1321 Encounter for screening for nutritional disorder: Secondary | ICD-10-CM | POA: Diagnosis not present

## 2022-12-19 DIAGNOSIS — Z13228 Encounter for screening for other metabolic disorders: Secondary | ICD-10-CM | POA: Diagnosis not present

## 2022-12-20 LAB — COMPREHENSIVE METABOLIC PANEL
ALT: 16 IU/L (ref 0–44)
AST: 18 IU/L (ref 0–40)
Albumin/Globulin Ratio: 2.2 (ref 1.2–2.2)
Albumin: 3.5 g/dL — ABNORMAL LOW (ref 3.7–4.7)
Alkaline Phosphatase: 66 IU/L (ref 44–121)
BUN/Creatinine Ratio: 21 (ref 10–24)
BUN: 19 mg/dL (ref 8–27)
Bilirubin Total: 0.6 mg/dL (ref 0.0–1.2)
CO2: 27 mmol/L (ref 20–29)
Calcium: 9 mg/dL (ref 8.6–10.2)
Chloride: 104 mmol/L (ref 96–106)
Creatinine, Ser: 0.92 mg/dL (ref 0.76–1.27)
Globulin, Total: 1.6 g/dL (ref 1.5–4.5)
Glucose: 88 mg/dL (ref 70–99)
Potassium: 4.6 mmol/L (ref 3.5–5.2)
Sodium: 143 mmol/L (ref 134–144)
Total Protein: 5.1 g/dL — ABNORMAL LOW (ref 6.0–8.5)
eGFR: 83 mL/min/{1.73_m2} (ref >59)

## 2022-12-20 LAB — LIPID PANEL
Chol/HDL Ratio: 3.3 ratio (ref 0.0–5.0)
Cholesterol, Total: 150 mg/dL (ref 100–199)
HDL: 45 mg/dL (ref >39)
LDL Chol Calc (NIH): 87 mg/dL (ref 0–99)
Triglycerides: 96 mg/dL (ref 0–149)
VLDL Cholesterol Cal: 18 mg/dL (ref 5–40)

## 2022-12-20 LAB — CBC WITH DIFFERENTIAL/PLATELET
Basophils Absolute: 0.1 10*3/uL (ref 0.0–0.2)
Basos: 1 %
EOS (ABSOLUTE): 0.3 10*3/uL (ref 0.0–0.4)
Eos: 4 %
Hematocrit: 43.6 % (ref 37.5–51.0)
Hemoglobin: 14.7 g/dL (ref 13.0–17.7)
Immature Grans (Abs): 0 10*3/uL (ref 0.0–0.1)
Immature Granulocytes: 0 %
Lymphocytes Absolute: 1.9 10*3/uL (ref 0.7–3.1)
Lymphs: 21 %
MCH: 32.1 pg (ref 26.6–33.0)
MCHC: 33.7 g/dL (ref 31.5–35.7)
MCV: 95 fL (ref 79–97)
Monocytes Absolute: 0.8 10*3/uL (ref 0.1–0.9)
Monocytes: 9 %
Neutrophils Absolute: 5.8 10*3/uL (ref 1.4–7.0)
Neutrophils: 65 %
Platelets: 632 10*3/uL — ABNORMAL HIGH (ref 150–450)
RBC: 4.58 x10E6/uL (ref 4.14–5.80)
RDW: 14.1 % (ref 11.6–15.4)
WBC: 9 10*3/uL (ref 3.4–10.8)

## 2022-12-20 LAB — HEMOGLOBIN A1C
Est. average glucose Bld gHb Est-mCnc: 117 mg/dL
Hgb A1c MFr Bld: 5.7 % — ABNORMAL HIGH (ref 4.8–5.6)

## 2022-12-20 LAB — TSH: TSH: 2.51 u[IU]/mL (ref 0.450–4.500)

## 2022-12-25 NOTE — Progress Notes (Unsigned)
Complete physical exam   Patient: Larry Harris   DOB: 02-23-1941   82 y.o. Male  MRN: [id number] Visit Date: 12/26/2022    No chief complaint on file.  Subjective    Larry Harris is a 82 y.o. male who presents today for a complete physical exam.  He reports consuming a {diet types:17450} diet. {Exercise:19826} He generally feels {well/fairly well/poorly:18703}. He {does/does not:200015} have additional problems to discuss today.   HPI  Annual physical  -mildly low protein -HgbA1c 5.7 with normal fasting glucose  -high platelet count. ?has this been a problem in  the past?  -good cholesterol.   Past Medical History:  Diagnosis Date   Acute rhinitis    Allergic rhinitis, cause unspecified    Allergy    BPH (benign prostatic hyperplasia)    CAD (coronary artery disease)    Cancer (HCC)    scalp basal cell skin ca   Cataract    bilateral cateracts removed   Clotting disorder ([place])    Hypercholesteremia    Hypotension    Neuralgia, neuritis, and radiculitis, unspecified    Osteoarthritis    Spinal stenosis of lumbar region 06/04/2014   Thrombocytopenia, unspecified ([place])    Past Surgical History:  Procedure Laterality Date   APPENDECTOMY  1958   bilateral cateracts removed     CHOLECYSTECTOMY N/A    [facility] 12/13/2020   COLONOSCOPY     EYE SURGERY N/A    [facility]reesia 12/13/2020   HERNIA REPAIR  1965   x2   Social History   Socioeconomic History   Marital status: Widowed    Spouse name: Not on file   Number of children: 2   Years of education: college 4   Highest education level: Not on file  Occupational History   Occupation: Retired  Tobacco Use   Smoking status: Former    Packs/day: 1.00    Years: 25.00    Additional pack years: 0.00    Total pack years: 25.00    Types: Cigarettes    Quit date: 07/27/1994    Years since quitting: 28.4   Smokeless tobacco: Never  Vaping Use   Vaping Use: Never used  Substance and Sexual Activity   Alcohol  use: Yes    Alcohol/week: 1.0 standard drink of alcohol    Types: 1 Glasses of wine per week    Comment: occasional   Drug use: No   Sexual activity: Not on file  Other Topics Concern   Not on file  Social History Narrative   The patient is widowed. 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 He has no history of alcohol use. He has previously done regular exercise until developing a back problem.      Patient drinks about 3-4 cups of coffee daily.   Patient is right handed.    Social Determinants of Health   Financial Resource Strain: Not on file  Food Insecurity: Not on file  Transportation Needs: Not on file  Physical Activity: Not on file  Stress: Not on file  Social Connections: Not on file  Intimate Partner Violence: Not on file   Family Status  Relation Name Status   Mother  Deceased at age 30   Father  Deceased at age 47   Brother  Alive       chf   Sister  [place]  Deceased   MGF  Deceased   [place]  Deceased   PGF  Deceased  Neg Hx  (Not Specified)   Family History  Problem Relation Age of Onset   Stroke Mother 67   Prostate cancer Father 44   Heart failure Brother 64   Lung cancer Sister    Colon cancer Neg Hx    Esophageal cancer Neg Hx    Rectal cancer Neg Hx    Stomach cancer Neg Hx    No Known Allergies  Patient Care Team: Lorrene Reid, PA-C (Inactive) as PCP - General Buford Dresser, MD as PCP - Cardiology (Cardiology)   Medications: Outpatient Medications Prior to Visit  Medication Sig   aspirin 325 MG tablet Take 325 mg by mouth daily.   b complex vitamins tablet Take 1 tablet by mouth daily.   calcium carbonate (OS-CAL) 600 MG TABS Take 600 mg by mouth daily.   diphenhydrAMINE (BENADRYL) 25 mg capsule Take 25 mg by mouth as needed (insomnia).   doxazosin (CARDURA) 2 MG tablet TAKE 1 TABLET BY MOUTH EVERYDAY AT BEDTIME   fluticasone (FLONASE) 50 MCG/ACT nasal spray SPRAY 2 SPRAYS INTO EACH NOSTRIL EVERY DAY (Patient  taking differently: 2 sprays as needed.)   gabapentin (NEURONTIN) 300 MG capsule TAKE 1 CAPSULE BY MOUTH THREE TIMES A DAY   hydrochlorothiazide (HYDRODIURIL) 12.5 MG tablet Take 1 tablet (12.5 mg total) by mouth daily.   hydrocortisone (ANUSOL-HC) 25 MG suppository Place 1 suppository (25 mg total) rectally 2 (two) times daily.   Multiple Vitamin (MULTIVITAMIN) capsule Take 1 capsule by mouth daily.   vitamin B-12 (CYANOCOBALAMIN) 1000 MCG tablet Take 1,000 mcg by mouth daily.   VITAMIN D, CHOLECALCIFEROL, PO Take 1,000 Units by mouth daily.   No facility-administered medications prior to visit.    Review of Systems  {Labs (Optional):23779}   Objective    There were no vitals filed for this visit. There is no height or weight on file to calculate BMI.  BP Readings from Last 3 Encounters:  12/05/22 (Abnormal) 144/78  09/29/22 (Abnormal) 142/70  09/07/22 (Abnormal) 172/68    Wt Readings from Last 3 Encounters:  12/05/22 175 lb (79.4 kg)  09/29/22 179 lb 1.6 oz (81.2 kg)  09/07/22 170 lb (77.1 kg)     Physical Exam  ***  Last depression screening scores   Row Labels 06/22/2022   10:00 AM 12/20/2021   10:15 AM 07/09/2021   11:12 AM  PHQ 2/9 Scores   Section Header. No data exists in this row.     PHQ - 2 Score   0 0 0  PHQ- 9 Score   2 2 0   Last fall risk screening   Row Labels 06/22/2022   10:00 AM  Fall Risk    Section Header. No data exists in this row.   Falls in the past year?   0  Number falls in past yr:   0  Injury with Fall?   0  Risk for fall due to :   No Fall Risks  Follow up   Falls evaluation completed   Last Audit-C alcohol use screening   No data to display    A score of 3 or more in women, and 4 or more in men indicates increased risk for alcohol abuse, EXCEPT if all of the points are from question 1   No results found for any visits on 12/26/22.  Assessment & Plan    Routine Health Maintenance and Physical Exam  Exercise Activities and  Dietary recommendations  Goals   None  Immunization History  Administered Date(s) Administered   Influenza, High Dose Seasonal PF 06/20/2017, 07/17/2018, 06/10/2019   Influenza-Unspecified 06/27/2015, 07/12/2016, 06/20/2017, 07/17/2018, 06/17/2020   Moderna Covid-19 Vaccine Bivalent Booster 59yrs & up 07/16/2021   Moderna SARS-COV2 Booster Vaccination 07/27/2020, 12/30/2020   Moderna Sars-Covid-2 Vaccination 10/08/2019, 11/05/2019   Pneumococcal Conjugate-13 08/25/2017   Pneumococcal Polysaccharide-23 08/01/2016   Tdap 08/01/2016   Zoster Recombinat (Shingrix) 03/26/2018, 08/02/2018   Zoster, Live 09/27/2011    Health Maintenance  Topic Date Due   Medicare Annual Wellness (AWV)  08/28/2019   INFLUENZA VACCINE  04/26/2022   COVID-19 Vaccine (4 - 2023-24 season) 05/27/2022   DTaP/Tdap/Td (2 - Td or Tdap) 08/01/2026   Pneumonia Vaccine 25+ Years old  Completed   Zoster Vaccines- Shingrix  Completed   HPV VACCINES  Aged Out    Discussed health benefits of physical activity, and encouraged him to engage in regular exercise appropriate for his age and condition.  There are no diagnoses linked to this encounter.  No follow-ups on file.        Ronnell Freshwater, NP  [facility] [phone] (phone) [phone] (fax)  [place]

## 2022-12-26 ENCOUNTER — Encounter: Payer: Self-pay | Admitting: Nurse Practitioner

## 2022-12-26 ENCOUNTER — Ambulatory Visit (INDEPENDENT_AMBULATORY_CARE_PROVIDER_SITE_OTHER): Payer: PPO | Admitting: Nurse Practitioner

## 2022-12-26 VITALS — BP 107/65 | HR 78 | Ht 72.0 in | Wt 171.1 lb

## 2022-12-26 DIAGNOSIS — M48062 Spinal stenosis, lumbar region with neurogenic claudication: Secondary | ICD-10-CM | POA: Diagnosis not present

## 2022-12-26 DIAGNOSIS — N400 Enlarged prostate without lower urinary tract symptoms: Secondary | ICD-10-CM | POA: Diagnosis not present

## 2022-12-26 DIAGNOSIS — I10 Essential (primary) hypertension: Secondary | ICD-10-CM | POA: Diagnosis not present

## 2022-12-26 DIAGNOSIS — Z0001 Encounter for general adult medical examination with abnormal findings: Secondary | ICD-10-CM | POA: Diagnosis not present

## 2022-12-26 DIAGNOSIS — K649 Unspecified hemorrhoids: Secondary | ICD-10-CM | POA: Insufficient documentation

## 2022-12-26 MED ORDER — HYDROCORTISONE ACETATE 25 MG RE SUPP: 25.0000 mg | Freq: Two times a day (BID) | RECTAL | 5 refills | Status: DC

## 2022-12-26 MED ORDER — DOXAZOSIN MESYLATE 2 MG PO TABS: ORAL_TABLET | ORAL | 3 refills | Status: DC

## 2022-12-26 MED ORDER — GABAPENTIN 300 MG PO CAPS: ORAL_CAPSULE | ORAL | 3 refills | Status: DC

## 2022-12-26 MED ORDER — HYDROCHLOROTHIAZIDE 12.5 MG PO TABS: 12.5000 mg | ORAL_TABLET | Freq: Every day | ORAL | 3 refills | Status: DC

## 2022-12-26 NOTE — Assessment & Plan Note (Signed)
Stable. Continue gabapentin dosing as before.

## 2022-12-26 NOTE — Assessment & Plan Note (Signed)
Annual physical exam performed today

## 2022-12-26 NOTE — Assessment & Plan Note (Signed)
Stable. --generally well controlled   

## 2022-12-26 NOTE — Assessment & Plan Note (Signed)
Use hydrocortisone cream/suppositories as needed and as prescribed

## 2022-12-26 NOTE — Assessment & Plan Note (Signed)
Stable.  Continue current medication

## 2023-04-18 DIAGNOSIS — M48062 Spinal stenosis, lumbar region with neurogenic claudication: Secondary | ICD-10-CM | POA: Diagnosis not present

## 2023-06-01 DIAGNOSIS — D225 Melanocytic nevi of trunk: Secondary | ICD-10-CM | POA: Diagnosis not present

## 2023-06-01 DIAGNOSIS — Z872 Personal history of diseases of the skin and subcutaneous tissue: Secondary | ICD-10-CM | POA: Diagnosis not present

## 2023-06-01 DIAGNOSIS — Z85828 Personal history of other malignant neoplasm of skin: Secondary | ICD-10-CM | POA: Diagnosis not present

## 2023-06-01 DIAGNOSIS — Z08 Encounter for follow-up examination after completed treatment for malignant neoplasm: Secondary | ICD-10-CM | POA: Diagnosis not present

## 2023-06-01 DIAGNOSIS — L570 Actinic keratosis: Secondary | ICD-10-CM | POA: Diagnosis not present

## 2023-06-01 DIAGNOSIS — L578 Other skin changes due to chronic exposure to nonionizing radiation: Secondary | ICD-10-CM | POA: Diagnosis not present

## 2023-06-01 DIAGNOSIS — L814 Other melanin hyperpigmentation: Secondary | ICD-10-CM | POA: Diagnosis not present

## 2023-06-01 DIAGNOSIS — L821 Other seborrheic keratosis: Secondary | ICD-10-CM | POA: Diagnosis not present

## 2023-06-07 ENCOUNTER — Encounter (HOSPITAL_BASED_OUTPATIENT_CLINIC_OR_DEPARTMENT_OTHER): Payer: Self-pay | Admitting: Cardiology

## 2023-06-07 ENCOUNTER — Ambulatory Visit (HOSPITAL_BASED_OUTPATIENT_CLINIC_OR_DEPARTMENT_OTHER): Payer: PPO | Admitting: Cardiology

## 2023-06-07 VITALS — BP 138/69 | HR 68 | Ht 72.0 in | Wt 169.2 lb

## 2023-06-07 DIAGNOSIS — R600 Localized edema: Secondary | ICD-10-CM | POA: Diagnosis not present

## 2023-06-07 DIAGNOSIS — I700 Atherosclerosis of aorta: Secondary | ICD-10-CM | POA: Diagnosis not present

## 2023-06-07 DIAGNOSIS — Z7189 Other specified counseling: Secondary | ICD-10-CM | POA: Diagnosis not present

## 2023-06-07 DIAGNOSIS — R5383 Other fatigue: Secondary | ICD-10-CM | POA: Diagnosis not present

## 2023-06-07 DIAGNOSIS — I10 Essential (primary) hypertension: Secondary | ICD-10-CM

## 2023-06-07 NOTE — Patient Instructions (Signed)

## 2023-06-07 NOTE — Progress Notes (Signed)
Cardiology Office Note:  .    Date:  06/07/2023  ID:  Larry Harris, DOB Jul 04, 1941, MRN [id number] PCP: Sandre Kitty, MD  [facility] Providers Cardiologist:  Jodelle Red, MD     History of Present Illness: .    Larry Harris is a 82 y.o. male with a hx of hypertension and intermittent hypotension, hyperlipidemia, who is seen for follow-up today. He was initially seen 09/29/2022 as a new consult at the request of Mayer Masker, PA-C for the evaluation and management of hypertensive urgency.   Previously seen by Dr. Excell Seltzer 04/2011. Initially he had developed neck and ear pain in the setting of a fever. EKG performed in the ED demonstrated inferolateral ST segment abnormality worrisome for ischemia. No chest pain or pressure. A nuclear stress test was ordered and was normal.   On 09/07/2022 he was seen in the ED with increasing blood pressures over 1 week (172/68 in ER). Normally around 130-140/60-70, his BP had ranged 150-180 systolic at home. He also complained of new LE edema and fatigue. EKG revealed sinus rhythm at 70 bpm, multiform ventricular premature complexes, probable left atrial enlargement, right bundle branch block, besides PVCs similar to 2020. No signs/symptoms of endorgan damage save for questionable BNP. He was started on a small dose of hydrochlorothiazide for extra diuretic effect for his leg swelling. It was felt he did not need to be put on Lasix emergently. He was referred for outpatient cardiology follow-up and possible echocardiogram.   Cardiovascular risk factors: Prior clinical ASCVD: none Comorbid conditions: Hypertension - He notes that his increasing blood pressure has been gradual over years. One morning he had noticed his BP was almost to 200 systolic. Lately his blood pressures have been ranging 130s-mid 160s systolic. His regimen has included doxazosin and gabapentin for a long time. Aspirin 325 mg started 20+ years ago.  Exercise  level: Typically he is an active person. For exercise he walks at least 1/2 a mile every day. He averages walking 2 miles throughout his daily activities. 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 Previously had a mechanical fall with a minor injury to his left shoulder. Current diet: He states that he takes a lot of supplemental vitamins.   At his initial appointment he complained of relatively new BLE edema. This wasn't noticeable as much in the summer time as he wore lower socks. He also felt very fatigued and noted heaviness in his legs with walking. He also had known sciatic issues. We discussed statin recommendations. He preferred to manage without medication. He had an echocardiogram 10/18/2022 revealing LVEF 60-65%, mild concentric LVH, and normal diastolic parameters. Noted aortic dilatation measuring 40 mm.   At his visit 11/2022, he was feeling well. We reviewed his echo results at length. In clinic his blood pressure was 144/78; confirmed it is usually higher in the office. At home readings averaged in the high 120s to 130s, over mid 60s to low 70s. He has known LE pain attributable to spinal stenosis, but no new leg pain.  Today, he is feeling alright. His main concern is noticing some variations in his pulse rate at times when checking his blood pressure. Normal resting heart rates in the 70's, but he has seen heart rates in the low 90's. Doesn't feel any palpitations.   His home blood pressures are usually stable, can be as low as 104 or 105 systolic associated with mild lightheadedness. He usually makes sure to be careful when standing to  avoid falls. Typically he is staying hydrated; sometimes drinks too many carbonated drinks, iced tea, coffee.  Every day he is walking about 2.5 to 3 miles daily. No issues with chest pain or heaviness. Mainly has issues with LE swelling when wearing winter socks which are tighter than his other socks.  He denies any shortness of breath,  headaches, syncope, orthopnea, or PND.  ROS:  Please see the history of present illness. ROS otherwise negative except as noted.  (+) Mild lightheadedness (+) Intermittent LE edema  Studies Reviewed: Marland Kitchen         Physical Exam:    VS:  BP 138/69 (BP Location: Left Arm, Patient Position: Sitting, Cuff Size: Normal)   Pulse 68   Ht 6' (1.829 m)   Wt 169 lb 3.2 oz (76.7 kg)   BMI 22.95 kg/m    Wt Readings from Last 3 Encounters:  06/07/23 169 lb 3.2 oz (76.7 kg)  12/26/22 171 lb 1.9 oz (77.6 kg)  12/05/22 175 lb (79.4 kg)    GEN: Well nourished, well developed in no acute distress HEENT: Normal, moist mucous membranes NECK: No JVD CARDIAC: regular rhythm, normal S1 and S2, no rubs or gallops. No murmur. VASCULAR: Radial and DP pulses 2+ bilaterally. No carotid bruits RESPIRATORY:  Clear to auscultation without rales, wheezing or rhonchi  ABDOMEN: Soft, non-tender, non-distended MUSCULOSKELETAL:  Ambulates independently SKIN: Warm and dry, no edema NEUROLOGIC:  Alert and oriented x 3. No focal neuro deficits noted. PSYCHIATRIC:  Normal affect   ASSESSMENT AND PLAN: .    Bilateral LE edema Fatigue -echo with Normal EF, mild cLVH, normal diastolic function, normal RAP. Suggests edema is noncardiac   Hypertension -on HCTZ and doxazosin, reports BP much better controlled at home   Aortic atherosclerosis -on high dose aspirin for noncardiac reasons, tolerating -discussed statin recommendations. He prefers to manage without medication   mild aortic dilation at 40 mm on echo, repeat 1 year   Cardiac risk counseling and prevention recommendations: -recommend heart healthy/Mediterranean diet, with whole grains, fruits, vegetable, fish, lean meats, nuts, and olive oil. Limit salt. -recommend moderate walking, 3-5 times/week for 30-50 minutes each session. Aim for at least 150 minutes.week. Goal should be pace of 3 miles/hours, or walking 1.5 miles in 30 minutes -recommend  avoidance of tobacco products. Avoid excess alcohol.  Dispo: Follow-up in 6 months, or sooner as needed.  I,Mathew Stumpf,acting as a Neurosurgeon for [employer], MD.,have documented all relevant documentation on the behalf of Jodelle Red, MD,as directed by  Jodelle Red, MD while in the presence of Jodelle Red, MD.  I, Jodelle Red, MD, have reviewed all documentation for this visit. The documentation on 07/24/23 for the exam, diagnosis, procedures, and orders are all accurate and complete.   Signed, Jodelle Red, MD

## 2023-06-19 ENCOUNTER — Other Ambulatory Visit: Payer: Self-pay | Admitting: Family Medicine

## 2023-06-19 DIAGNOSIS — R7303 Prediabetes: Secondary | ICD-10-CM

## 2023-06-19 DIAGNOSIS — I10 Essential (primary) hypertension: Secondary | ICD-10-CM

## 2023-06-19 DIAGNOSIS — E785 Hyperlipidemia, unspecified: Secondary | ICD-10-CM

## 2023-06-23 ENCOUNTER — Other Ambulatory Visit: Payer: PPO

## 2023-06-23 DIAGNOSIS — R7303 Prediabetes: Secondary | ICD-10-CM

## 2023-06-23 DIAGNOSIS — E785 Hyperlipidemia, unspecified: Secondary | ICD-10-CM

## 2023-06-23 DIAGNOSIS — I10 Essential (primary) hypertension: Secondary | ICD-10-CM | POA: Diagnosis not present

## 2023-06-24 LAB — HEMOGLOBIN A1C
Est. average glucose Bld gHb Est-mCnc: 123 mg/dL
Hgb A1c MFr Bld: 5.9 % — ABNORMAL HIGH (ref 4.8–5.6)

## 2023-06-24 LAB — CBC WITH DIFFERENTIAL/PLATELET
Basophils Absolute: 0.1 10*3/uL (ref 0.0–0.2)
Basos: 1 %
EOS (ABSOLUTE): 0.2 10*3/uL (ref 0.0–0.4)
Eos: 2 %
Hematocrit: 45.7 % (ref 37.5–51.0)
Hemoglobin: 15 g/dL (ref 13.0–17.7)
Immature Grans (Abs): 0 10*3/uL (ref 0.0–0.1)
Immature Granulocytes: 0 %
Lymphocytes Absolute: 2.3 10*3/uL (ref 0.7–3.1)
Lymphs: 29 %
MCH: 32.8 pg (ref 26.6–33.0)
MCHC: 32.8 g/dL (ref 31.5–35.7)
MCV: 100 fL — ABNORMAL HIGH (ref 79–97)
Monocytes Absolute: 0.6 10*3/uL (ref 0.1–0.9)
Monocytes: 8 %
Neutrophils Absolute: 4.7 10*3/uL (ref 1.4–7.0)
Neutrophils: 60 %
Platelets: 594 10*3/uL — ABNORMAL HIGH (ref 150–450)
RBC: 4.58 x10E6/uL (ref 4.14–5.80)
RDW: 13.7 % (ref 11.6–15.4)
WBC: 7.9 10*3/uL (ref 3.4–10.8)

## 2023-06-24 LAB — COMPREHENSIVE METABOLIC PANEL WITH GFR
ALT: 16 [IU]/L (ref 0–44)
AST: 20 [IU]/L (ref 0–40)
Albumin: 3.5 g/dL — ABNORMAL LOW (ref 3.7–4.7)
Alkaline Phosphatase: 59 [IU]/L (ref 44–121)
BUN/Creatinine Ratio: 11 (ref 10–24)
BUN: 11 mg/dL (ref 8–27)
Bilirubin Total: 0.7 mg/dL (ref 0.0–1.2)
CO2: 27 mmol/L (ref 20–29)
Calcium: 8.9 mg/dL (ref 8.6–10.2)
Chloride: 104 mmol/L (ref 96–106)
Creatinine, Ser: 1.01 mg/dL (ref 0.76–1.27)
Globulin, Total: 1.5 g/dL (ref 1.5–4.5)
Glucose: 80 mg/dL (ref 70–99)
Potassium: 4.4 mmol/L (ref 3.5–5.2)
Sodium: 141 mmol/L (ref 134–144)
Total Protein: 5 g/dL — ABNORMAL LOW (ref 6.0–8.5)
eGFR: 74 mL/min/{1.73_m2}

## 2023-06-27 ENCOUNTER — Encounter: Payer: Self-pay | Admitting: Family Medicine

## 2023-06-27 ENCOUNTER — Ambulatory Visit: Payer: PPO | Admitting: Family Medicine

## 2023-06-27 VITALS — BP 113/62 | HR 73 | Ht 72.0 in | Wt 167.4 lb

## 2023-06-27 DIAGNOSIS — Z23 Encounter for immunization: Secondary | ICD-10-CM

## 2023-06-27 DIAGNOSIS — Z0000 Encounter for general adult medical examination without abnormal findings: Secondary | ICD-10-CM | POA: Diagnosis not present

## 2023-06-27 NOTE — Patient Instructions (Signed)
    Mr. Larry Harris , Thank you for taking time to come for your Medicare Wellness Visit. I appreciate your ongoing commitment to your health goals. Please review the following plan we discussed and let me know if I can assist you in the future.   These are the goals we discussed:  Goals      Prevent falls     Patient would like to maintain weight        This is a list of the screening recommended for you and due dates:  Health Maintenance  Topic Date Due   Flu Shot  04/27/2023   COVID-19 Vaccine (5 - 2023-24 season) 08/14/2023   Medicare Annual Wellness Visit  06/26/2024   DTaP/Tdap/Td vaccine (2 - Td or Tdap) 08/01/2026   Pneumonia Vaccine  Completed   Zoster (Shingles) Vaccine  Completed   HPV Vaccine  Aged Out

## 2023-06-27 NOTE — Progress Notes (Unsigned)
Subjective:   Larry Harris is a 82 y.o. male who presents for Medicare Annual/Subsequent preventive examination.  Visit Complete: In person  Patient Medicare AWV questionnaire was completed by the patient on 09/272024; I have confirmed that all information answered by patient is correct and no changes since this date.  Fell down - tripped on a mat in the kitchen.  No recurrent issues.    Spinal stenosis.  gabapentin  Cardiology and dermatology - 6 months each.    Cardiac Risk Factors include: advanced age (>11men, >64 women);male gender     Objective:    Today's Vitals   06/27/23 1428  BP: 113/62  Pulse: 73  SpO2: 98%  Weight: 167 lb 6.4 oz (75.9 kg)  Height: 6' (1.829 m)   Body mass index is 22.7 kg/m.     06/27/2023    2:52 PM 09/07/2022    9:28 AM 02/15/2016    1:17 PM 06/10/2015   10:44 AM 06/08/2015   10:29 AM  Advanced Directives  Does Patient Have a Medical Advance Directive? Yes No Yes Yes Yes  Type of Advance Directive   Living will Healthcare Power of [place];Living will Living will  Does patient want to make changes to medical advance directive? No - Patient declined  No - Patient declined  No - Patient declined  Copy of Healthcare Power of Attorney in Chart?   No - copy requested  No - copy requested  Would patient like information on creating a medical advance directive?  Yes (ED - Information included in AVS)       Current Medications (verified) Outpatient Encounter Medications as of 06/27/2023  Medication Sig   aspirin 325 MG tablet Take 325 mg by mouth daily.   b complex vitamins tablet Take 1 tablet by mouth daily.   calcium carbonate (OS-CAL) 600 MG TABS Take 600 mg by mouth daily.   diphenhydrAMINE (BENADRYL) 25 mg capsule Take 25 mg by mouth as needed (insomnia).   doxazosin (CARDURA) 2 MG tablet TAKE 1 TABLET BY MOUTH EVERYDAY AT BEDTIME   fluticasone (FLONASE) 50 MCG/ACT nasal spray SPRAY 2 SPRAYS INTO EACH NOSTRIL EVERY DAY (Patient  taking differently: 2 sprays as needed.)   gabapentin (NEURONTIN) 300 MG capsule TAKE 1 CAPSULE BY MOUTH THREE TIMES A DAY   hydrochlorothiazide (HYDRODIURIL) 12.5 MG tablet Take 1 tablet (12.5 mg total) by mouth daily.   hydrocortisone (ANUSOL-HC) 25 MG suppository Place 1 suppository (25 mg total) rectally 2 (two) times daily.   Multiple Vitamin (MULTIVITAMIN) capsule Take 1 capsule by mouth daily.   vitamin B-12 (CYANOCOBALAMIN) 1000 MCG tablet Take 1,000 mcg by mouth daily.   VITAMIN D, CHOLECALCIFEROL, PO Take 1,000 Units by mouth daily.   No facility-administered encounter medications on file as of 06/27/2023.    Allergies (verified) Patient has no known allergies.   History: Past Medical History:  Diagnosis Date   Acute rhinitis    Allergic rhinitis, cause unspecified    Allergy    BPH (benign prostatic hyperplasia)    CAD (coronary artery disease)    Cancer (HCC)    scalp basal cell skin ca   Cataract    bilateral cateracts removed   Clotting disorder (HCC)    Hypercholesteremia    Hypotension    Neuralgia, neuritis, and radiculitis, unspecified    Osteoarthritis    Spinal stenosis of lumbar region 06/04/2014   Thrombocytopenia, unspecified (HCC)    Past Surgical History:  Procedure Laterality Date   APPENDECTOMY  09/26/1956   bilateral cateracts removed     COLONOSCOPY     EYE SURGERY N/A    [employer]reesia 12/13/2020   HERNIA REPAIR  09/27/1963   x2   Family History  Problem Relation Age of Onset   Stroke Mother 44   Prostate cancer Father 6   Heart failure Brother 7   Lung cancer Sister    Colon cancer Neg Hx    Esophageal cancer Neg Hx    Rectal cancer Neg Hx    Stomach cancer Neg Hx    Social History   Socioeconomic History   Marital status: Widowed    Spouse name: Not on file   Number of children: 2   Years of education: college 4   Highest education level: Not on file  Occupational History   Occupation: Retired  Tobacco Use   Smoking status:  Former    Current packs/day: 0.00    Average packs/day: 1 pack/day for 25.0 years (25.0 ttl pk-yrs)    Types: Cigarettes    Start date: 07/27/1969    Quit date: 07/27/1994    Years since quitting: 28.9   Smokeless tobacco: Never  Vaping Use   Vaping status: Never Used  Substance and Sexual Activity   Alcohol use: Yes    Alcohol/week: 1.0 standard drink of alcohol    Types: 1 Glasses of wine per week    Comment: occasional   Drug use: No   Sexual activity: Not on file  Other Topics Concern   Not on file  Social History Narrative   The patient is widowed. He is retired from Airline pilot. He quit smoking in 1995. He has no history of alcohol use. He has previously done regular exercise until developing a back problem.      Patient drinks about 3-4 cups of coffee daily.   Patient is right handed.    Social Determinants of Health   Financial Resource Strain: Low Risk  (06/27/2023)   Overall Financial Resource Strain (CARDIA)    Difficulty of Paying Living Expenses: Not hard at all  Food Insecurity: No Food Insecurity (06/27/2023)   Hunger Vital Sign    Worried About Running Out of Food in the Last Year: Never true    Ran Out of Food in the Last Year: Never true  Transportation Needs: No Transportation Needs (06/27/2023)   PRAPARE - Administrator, Civil Service (Medical): No    Lack of Transportation (Non-Medical): No  Physical Activity: Sufficiently Active (06/27/2023)   Exercise Vital Sign    Days of Exercise per Week: 5 days    Minutes of Exercise per Session: 30 min  Stress: Stress Concern Present (06/27/2023)   [employer] of Occupational Health - Occupational Stress Questionnaire    Feeling of Stress : To some extent  Social Connections: Moderately Isolated (06/27/2023)   Social Connection and Isolation Panel [NHANES]    Frequency of Communication with Friends and Family: More than three times a week    Frequency of Social Gatherings with Friends and Family: Three  times a week    Attends Religious Services: More than 4 times per year    Active Member of Clubs or Organizations: No    Attends Banker Meetings: Never    Marital Status: Widowed    Tobacco Counseling Counseling given: Not Answered   Clinical Intake:  Pre-visit preparation completed: Yes  Pain : No/denies pain     BMI - recorded: 22.7 Nutritional Status: BMI of 19-24  Normal Nutritional Risks: None Diabetes: No  How often do you need to have someone help you when you read instructions, pamphlets, or other written materials from your doctor or pharmacy?: 1 - Never  Interpreter Needed?: No      Activities of Daily Living    06/27/2023    2:37 PM 06/23/2023   12:21 PM  In your present state of health, do you have any difficulty performing the following activities:  Hearing? 0 0  Vision? 1 0  Comment readiers   Difficulty concentrating or making decisions? 0 0  Walking or climbing stairs? 1 0  Dressing or bathing? 0 0  Doing errands, shopping? 0 0  Preparing Food and eating ? N N  Using the Toilet? N N  In the past six months, have you accidently leaked urine? N N  Do you have problems with loss of bowel control? N N  Managing your Medications? N N  Managing your Finances? N N  Housekeeping or managing your Housekeeping? N N    Patient Care Team: Sandre Kitty, MD as PCP - General (Family Medicine) Jodelle Red, MD as PCP - Cardiology (Cardiology)  Indicate any recent Medical Services you may have received from other than [facility] providers in the past year (date may be approximate).     Assessment:   This is a routine wellness examination for Anders.  Hearing/Vision screen No results found.   Goals Addressed             This Visit's Progress    Prevent falls       Patient would like to maintain weight      Depression Screen    06/27/2023    2:56 PM 12/26/2022    8:36 AM 06/22/2022   10:00 AM 12/20/2021   10:15 AM  07/09/2021   11:12 AM 04/27/2021    2:59 PM 12/16/2020    1:37 PM  PHQ 2/9 Scores  PHQ - 2 Score 0 0 0 0 0 0 0  PHQ- 9 Score  0 2 2 0 1 0    Fall Risk    06/27/2023    2:53 PM 06/23/2023   12:21 PM 12/26/2022    8:35 AM 06/22/2022   10:00 AM 12/20/2021   10:14 AM  Fall Risk   Falls in the past year? 1 1 1  0 0  Number falls in past yr: 0 0 0 0 0  Injury with Fall? 1 0 0 0 0  Comment hurt shoulder      Risk for fall due to : Impaired balance/gait   No Fall Risks No Fall Risks  Follow up Falls evaluation completed  Falls evaluation completed Falls evaluation completed Falls evaluation completed    MEDICARE RISK AT HOME: Medicare Risk at Home Any stairs in or around the home?: No If so, are there any without handrails?: No Home free of loose throw rugs in walkways, pet beds, electrical cords, etc?: Yes Adequate lighting in your home to reduce risk of falls?: Yes Life alert?: No Use of a cane, walker or w/c?: No Grab bars in the bathroom?: Yes Shower chair or bench in shower?: Yes Elevated toilet seat or a handicapped toilet?: Yes  TIMED UP AND GO:  Was the test performed?  {[phone]}    Cognitive Function:    08/01/2016    8:45 AM  MMSE - Mini Mental State Exam  Orientation to time 5  Orientation to Place 5  Registration 3  Attention/ Calculation  5  Recall 3  Language- name 2 objects 2  Language- repeat 1  Language- follow 3 step command 3  Language- read & follow direction 1  Write a sentence 1  Copy design 1  Total score 30        06/27/2023    2:56 PM 08/27/2018    8:17 AM 02/14/2018    1:10 PM 08/16/2017    8:31 AM  6CIT Screen  What Year? 0 points 0 points 0 points 0 points  What month? 0 points 0 points 0 points 0 points  What time? 0 points 0 points 0 points 0 points  Count back from 20 0 points 0 points 0 points 0 points  Months in reverse 0 points 0 points 0 points 0 points  Repeat phrase 0 points 0 points 0 points 0 points  Total Score  0 points 0 points 0 points 0 points    Immunizations Immunization History  Administered Date(s) Administered   Influenza, High Dose Seasonal PF 06/20/2017, 07/17/2018, 06/10/2019   Influenza-Unspecified 06/27/2015, 07/12/2016, 06/20/2017, 07/17/2018, 06/17/2020   Moderna Covid-19 Vaccine Bivalent Booster 12yrs & up 07/16/2021, 06/19/2023   Moderna SARS-COV2 Booster Vaccination 07/27/2020, 12/30/2020   Moderna Sars-Covid-2 Vaccination 10/08/2019, 11/05/2019   Pneumococcal Conjugate-13 08/25/2017   Pneumococcal Polysaccharide-23 08/01/2016   Tdap 08/01/2016   Zoster Recombinant(Shingrix) 03/26/2018, 08/02/2018   Zoster, Live 09/27/2011    TDAP status: Up to date  Flu Vaccine status: Up to date  Pneumococcal vaccine status: Up to date  Covid-19 vaccine status: Completed vaccines  Qualifies for Shingles Vaccine? No   Zostavax completed Yes   Shingrix Completed?: Yes  Screening Tests Health Maintenance  Topic Date Due   INFLUENZA VACCINE  04/27/2023   COVID-19 Vaccine (5 - 2023-24 season) 08/14/2023   Medicare Annual Wellness (AWV)  06/26/2024   DTaP/Tdap/Td (2 - Td or Tdap) 08/01/2026   Pneumonia Vaccine 51+ Years old  Completed   Zoster Vaccines- Shingrix  Completed   HPV VACCINES  Aged Out    Health Maintenance  Health Maintenance Due  Topic Date Due   INFLUENZA VACCINE  04/27/2023    Colorectal cancer screening: No longer required.   Lung Cancer Screening: (Low Dose CT Chest recommended if Age 84-80 years, 20 pack-year currently smoking OR have quit w/in 15years.) does not qualify.     Additional Screening:  Hepatitis C Screening: does not qualify; Completed yes  Vision Screening: Recommended annual ophthalmology exams for early detection of glaucoma and other disorders of the eye. Is the patient up to date with their annual eye exam?  Yes    IDental Screening: Recommended annual dental exams for proper oral hygiene  Community Resource Referral / Chronic  Care Management: CRR required this visit?  No   CCM required this visit?  No     Plan:     I have personally reviewed and noted the following in the patient's chart:   Medical and social history Use of alcohol, tobacco or illicit drugs  Current medications and supplements including opioid prescriptions. {Opioid Prescriptions:[phone]} Functional ability and status Nutritional status Physical activity Advanced directives List of other physicians Hospitalizations, surgeries, and ER visits in previous 12 months Vitals Screenings to include cognitive, depression, and falls Referrals and appointments  In addition, I have reviewed and discussed with patient certain preventive protocols, quality metrics, and best practice recommendations. A written personalized care plan for preventive services as well as general preventive health recommendations were provided to patient.  After Visit Summary: {CHL AMB AWV After Visit [phone]}

## 2023-06-28 ENCOUNTER — Ambulatory Visit: Payer: PPO | Admitting: Family Medicine

## 2023-06-28 ENCOUNTER — Encounter: Payer: PPO | Admitting: Family Medicine

## 2023-07-04 ENCOUNTER — Encounter: Payer: PPO | Admitting: Family Medicine

## 2023-07-24 ENCOUNTER — Encounter (HOSPITAL_BASED_OUTPATIENT_CLINIC_OR_DEPARTMENT_OTHER): Payer: Self-pay | Admitting: Cardiology

## 2023-09-18 ENCOUNTER — Encounter: Payer: Self-pay | Admitting: Family Medicine

## 2023-09-18 ENCOUNTER — Ambulatory Visit (INDEPENDENT_AMBULATORY_CARE_PROVIDER_SITE_OTHER): Payer: PPO | Admitting: Family Medicine

## 2023-09-18 VITALS — BP 111/57 | HR 65 | Ht 72.0 in | Wt 171.1 lb

## 2023-09-18 DIAGNOSIS — J988 Other specified respiratory disorders: Secondary | ICD-10-CM | POA: Diagnosis not present

## 2023-09-18 DIAGNOSIS — B9789 Other viral agents as the cause of diseases classified elsewhere: Secondary | ICD-10-CM | POA: Diagnosis not present

## 2023-09-18 LAB — POCT INFLUENZA A/B
Influenza A, POC: NEGATIVE
Influenza B, POC: NEGATIVE

## 2023-09-18 LAB — POC COVID19 BINAXNOW: SARS Coronavirus 2 Ag: NEGATIVE

## 2023-09-18 NOTE — Patient Instructions (Addendum)
It was nice to see you today,  We addressed the following topics today: -We swabbed you for COVID and flu today.  You will be able to see the results in MyChart when they are ready. For your symptoms do the following: - Cough: Look for guaifenesin.  This is in Mucinex and other brands.  You can take 600 mg of guaifenesin twice a day - Nasal congestion: Take nasal saline spray such as that made by [employer], as often as needed.  You can also take Afrin or generic Afrin nasal spray.  This is a decongestant. - Sore throat: Cepacol lozenges or any lozenge that contains lidocaines or menthol. - Headache, fever: You can take Tylenol 1000 mg every 8 hours as needed.  If Tylenol is not helping you can take ibuprofen 400 mg every 8 hours for a few days at a time.  Have a great day,  Frederic Jericho, MD

## 2023-09-18 NOTE — Progress Notes (Signed)
   Acute Office Visit  Subjective:     Patient ID: Larry Harris, male    DOB: [birth year], 82 y.o.   MRN: [id number]  Chief Complaint  Patient presents with   Nasal Congestion    HPI Patient is in today for upper respiratory symptoms.  Patient has had upper respiratory symptoms since Saturday.  Has not been around anybody that has been sick.  Mostly complains of sinus congestion, rhinorrhea, sore throat.  His cough is mild.  No ear complaints or headaches.  No fevers or chills.  Took NyQuil 1 night but made him sleep through church the following morning so he did not take this again.  Took DayQuil a few times since then.  ROS      Objective:    BP (!) 111/57   Pulse 65   Ht 6' (1.829 m)   Wt 171 lb 1.9 oz (77.6 kg)   SpO2 99%   BMI 23.21 kg/m    Physical Exam General: Alert, oriented HEENT: PERRLA, moist mucosa, no oropharyngeal erythema CV: Regular rate and rhythm no murmurs Pulmonary: Lungs equal bilaterally no wheeze or crackles.  No results found for any visits on 09/18/23.      Assessment & Plan:   Viral respiratory infection Assessment & Plan: Testing for COVID and flu.  Symptoms not concerning for bacterial pneumonia.  Discussed when we will treat for bacterial sinusitis.  Discussed symptomatic treatment options.  Advised to return to clinic if symptoms worsen.      Return if symptoms worsen or fail to improve.  Sandre Kitty, MD

## 2023-09-18 NOTE — Assessment & Plan Note (Signed)
Testing for COVID and flu.  Symptoms not concerning for bacterial pneumonia.  Discussed when we will treat for bacterial sinusitis.  Discussed symptomatic treatment options.  Advised to return to clinic if symptoms worsen.

## 2023-09-26 ENCOUNTER — Other Ambulatory Visit: Payer: Self-pay | Admitting: Family Medicine

## 2023-09-26 ENCOUNTER — Ambulatory Visit: Payer: Self-pay | Admitting: Family Medicine

## 2023-09-26 MED ORDER — AMOXICILLIN-POT CLAVULANATE 875-125 MG PO TABS: 1.0000 | ORAL_TABLET | Freq: Two times a day (BID) | ORAL | 0 refills | Status: AC

## 2023-09-26 MED ORDER — AZITHROMYCIN 500 MG PO TABS: 500.0000 mg | ORAL_TABLET | Freq: Every day | ORAL | 0 refills | Status: DC

## 2023-09-26 NOTE — Telephone Encounter (Signed)
 Please call the patient and advise him I sent in two antibiotics for him to take for the next 5 days.

## 2023-09-26 NOTE — Telephone Encounter (Signed)
 Chief Complaint: night sweats Symptoms: congestion, runny nose, night sweats Frequency: 09/16/23 Pertinent Negatives: Patient denies sore throat, SOB, chest pain, earaches  Disposition: [] ED /[] Urgent Care (no appt availability in office) / [x] Appointment(In office/virtual)/ []  [facility] [facility]/ [] Home Care/ [] Refused Recommended Disposition /[] [facility] [place] [facility]/ []  Follow-up with PCP Additional Notes: Pt states he has been using Muccinex, Aleve, nasal spray and sudafed. Pt concerned about night sweats the past 2 nights and his nasal congestion with brown mucus. Pt refused virtual appointment and states he doesn't know how to use it. Pt agreeable to first available appt on Jan 2nd. Pt requesting follow up from office if Dr Chandra will prescribe him something rather than having to come for follow up appointment. Pt verbalizes understanding of home care advice and concerning symptoms to call back for.  Copied from CRM [id number]. Topic: Clinical - Red Word Triage >> Sep 26, 2023 10:03 AM Chase C wrote: Red Word that prompted transfer to Nurse Triage: Patient called in last week 09/18/2023 with a sinus head cold and took some over the counter sudafed for a few days. And the past couple mornings he has woken up and his collar of the shirt was wet as if he was sweating through his sleep and the temperature was not increased for it to overheat him during his sleep, patient believes could have a fever. And he has been feeling very fatigue since last week he usually walks over a mile per day. Doctor provided mucinex and a nasal spray but the medications have not worked. Transferred to Nurse Triage Line. Reason for Disposition  [1] Sinus congestion (pressure, fullness) AND [2] present > 10 days  Answer Assessment - Initial Assessment Questions 1. LOCATION: Where does it hurt?      Denies any pain. 2. ONSET: When did the sinus pain start?  (e.g., hours, days)      Denies.  3. SEVERITY:  How bad is the pain?   (Scale 1-10; mild, moderate or severe)   - MILD (1-3): doesn't interfere with normal activities    - MODERATE (4-7): interferes with normal activities (e.g., work or school) or awakens from sleep   - SEVERE (8-10): excruciating pain and patient unable to do any normal activities        Denies.  4. RECURRENT SYMPTOM: Have you ever had sinus problems before? If Yes, ask: When was the last time? and What happened that time?      Pt was seen on 12/23 and evaluated for symptoms which did not improve his upper congestion with muccinex, nasal spray and sudafed.   5. NASAL CONGESTION: Is the nose blocked? If Yes, ask: Can you open it or must you breathe through your mouth?     Pt states it is mostly congested, not a lot he can blow out.  6. NASAL DISCHARGE: Do you have discharge from your nose? If so ask, What color?     Light brown color this morning.  7. FEVER: Do you have a fever? If Yes, ask: What is it, how was it measured, and when did it start?      Unsure but pt states he has woken up the last 2 nights with his shirt wet (around the collar and the upper chest) from sweat and he thinks he is having fevers at night.  8. OTHER SYMPTOMS: Do you have any other symptoms? (e.g., sore throat, cough, earache, difficulty breathing)     Pt states it started with coughing up  mucus and chest congestion, night sweats, post nasal drip,  Protocols used: Sinus Pain or Congestion-A-AH

## 2023-09-26 NOTE — Telephone Encounter (Signed)
Called pt he is advised of his Rx that was sent to pharmacy  

## 2023-09-28 ENCOUNTER — Ambulatory Visit: Payer: PPO | Admitting: Family Medicine

## 2023-09-28 NOTE — Progress Notes (Deleted)
   Established Patient Office Visit  Subjective   Patient ID: Larry Harris, male    DOB: [birth year]  Age: 83 y.o. MRN: [id number]  No chief complaint on file.   HPI  Augmentin   for sinus infection    The ASCVD Risk score (Arnett DK, et al., 2019) failed to calculate for the following reasons:   The 2019 ASCVD risk score is only valid for ages 22 to 63  Health Maintenance Due  Topic Date Due   COVID-19 Vaccine (5 - 2024-25 season) 08/14/2023      Objective:     There were no vitals taken for this visit. {Vitals History (Optional):23777}  Physical Exam   No results found for any visits on 09/28/23.      Assessment & Plan:   There are no diagnoses linked to this encounter.   No follow-ups on file.    [name], MD

## 2023-11-27 ENCOUNTER — Telehealth: Payer: Self-pay

## 2023-11-27 MED ORDER — FLUTICASONE PROPIONATE 50 MCG/ACT NA SUSP: NASAL | 1 refills | Status: AC

## 2023-11-27 NOTE — Telephone Encounter (Signed)
 Prescription Request  11/27/2023  LOV: Visit date not found  What is the name of the medication or equipment? fluticasone (FLONASE) 50 MCG/ACT nasal spray   Have you contacted your pharmacy to request a refill? Yes   Which pharmacy would you like this sent to?  [id number] [place], [place] - [address] [place] [address] Phone: [phone] Fax: [phone]    Patient notified that their request is being sent to the clinical staff for review and that they should receive a response within 2 business days.   Please advise at Mobile [phone] (mobile)

## 2023-11-27 NOTE — Addendum Note (Signed)
 Addended by: Sandre Kitty on: 11/27/2023 03:59 PM   Modules accepted: Orders

## 2023-11-29 DIAGNOSIS — L821 Other seborrheic keratosis: Secondary | ICD-10-CM | POA: Diagnosis not present

## 2023-11-29 DIAGNOSIS — D485 Neoplasm of uncertain behavior of skin: Secondary | ICD-10-CM | POA: Diagnosis not present

## 2023-11-29 DIAGNOSIS — L578 Other skin changes due to chronic exposure to nonionizing radiation: Secondary | ICD-10-CM | POA: Diagnosis not present

## 2023-11-29 DIAGNOSIS — D0439 Carcinoma in situ of skin of other parts of face: Secondary | ICD-10-CM | POA: Diagnosis not present

## 2023-11-29 DIAGNOSIS — Z08 Encounter for follow-up examination after completed treatment for malignant neoplasm: Secondary | ICD-10-CM | POA: Diagnosis not present

## 2023-11-29 DIAGNOSIS — L814 Other melanin hyperpigmentation: Secondary | ICD-10-CM | POA: Diagnosis not present

## 2023-11-29 DIAGNOSIS — D225 Melanocytic nevi of trunk: Secondary | ICD-10-CM | POA: Diagnosis not present

## 2023-11-29 DIAGNOSIS — L570 Actinic keratosis: Secondary | ICD-10-CM | POA: Diagnosis not present

## 2023-11-29 DIAGNOSIS — Z872 Personal history of diseases of the skin and subcutaneous tissue: Secondary | ICD-10-CM | POA: Diagnosis not present

## 2023-11-29 DIAGNOSIS — Z85828 Personal history of other malignant neoplasm of skin: Secondary | ICD-10-CM | POA: Diagnosis not present

## 2023-11-30 ENCOUNTER — Encounter: Payer: Self-pay | Admitting: Family Medicine

## 2023-11-30 ENCOUNTER — Telehealth (INDEPENDENT_AMBULATORY_CARE_PROVIDER_SITE_OTHER): Admitting: Family Medicine

## 2023-11-30 VITALS — BP 147/75 | HR 89 | Temp 97.3°F | Wt 171.0 lb

## 2023-11-30 DIAGNOSIS — J329 Chronic sinusitis, unspecified: Secondary | ICD-10-CM | POA: Diagnosis not present

## 2023-11-30 MED ORDER — FLUTICASONE PROPIONATE 50 MCG/ACT NA SUSP: 2.0000 | Freq: Every day | NASAL | 6 refills | Status: DC

## 2023-11-30 MED ORDER — AZITHROMYCIN 250 MG PO TABS: ORAL_TABLET | ORAL | 0 refills | Status: AC

## 2023-11-30 MED ORDER — LORATADINE 10 MG PO TBDP: 10.0000 mg | ORAL_TABLET | Freq: Every day | ORAL | 12 refills | Status: AC

## 2023-11-30 NOTE — Assessment & Plan Note (Signed)
 Patient with recurrence of similar symptoms to the end of December.  At that time he was prescribed Augmentin and azithromycin x 5 days after conservative treatment for potential viral sinusitis was ineffective.  There is a possibility he has new seasonal allergies that are causing his symptoms.  Patient agreeable to trying azithromycin for 5 days again and then using second-generation antihistamine and nasal corticosteroid for allergic sinusitis if symptoms do not improve.

## 2023-11-30 NOTE — Progress Notes (Signed)
   Acute Office Visit  Subjective:     Patient ID: Larry Harris, male    DOB: [birth year], 83 y.o.   MRN: [id number]  Chief Complaint  Patient presents with   Nasal Congestion  Patient location: Home Provider location: [facility] primary care Method of visit: MyChart video visit Duration: 20 minutes  HPI Patient is in today for hoarseness.    Patient had similar symptoms at the end of December.  At that time he was prescribed Augmentin and azithromycin.  Also was prescribed Flonase which did take but ran out and is not taking currently.  Symptoms are mainly nasal congestion, sinus drainage, sinus pressure, no fever or cough.  Patient has no history of allergies but we discussed how these can begin later in life.  Agreeable to trying another round of azithromycin which the patient states has helped his sinus infections in the past and if this is unsuccessful he will start taking Claritin and Flonase for at least 2 weeks before letting us know if it is unsuccessful.   ROS      Objective:    BP (!) 147/75   Pulse 89   Temp (!) 97.3 F (36.3 C)   Wt 171 lb (77.6 kg)   BMI 23.19 kg/m    Physical Exam General: Alert, oriented. Pulmonary: No respiratory stress.  Able to speak full sentences without difficulty.  No results found for any visits on 11/30/23.      Assessment & Plan:   Sinusitis, unspecified chronicity, unspecified location Assessment & Plan: Patient with recurrence of similar symptoms to the end of December.  At that time he was prescribed Augmentin and azithromycin x 5 days after conservative treatment for potential viral sinusitis was ineffective.  There is a possibility he has new seasonal allergies that are causing his symptoms.  Patient agreeable to trying azithromycin for 5 days again and then using second-generation antihistamine and nasal corticosteroid for allergic sinusitis if symptoms do not improve.   Other orders -     Azithromycin; Take  2 tablets on day 1, then 1 tablet daily on days 2 through 5  Dispense: 6 tablet; Refill: 0 -     Loratadine; Take 1 tablet (10 mg total) by mouth daily. As needed for allergy symptoms  Dispense: 31 tablet; Refill: 12 -     Fluticasone Propionate; Place 2 sprays into both nostrils daily.  Dispense: 16 g; Refill: 6     No follow-ups on file.  Sandre Kitty, MD

## 2023-12-10 ENCOUNTER — Other Ambulatory Visit: Payer: Self-pay | Admitting: Nurse Practitioner

## 2023-12-10 DIAGNOSIS — I10 Essential (primary) hypertension: Secondary | ICD-10-CM

## 2023-12-10 DIAGNOSIS — M48062 Spinal stenosis, lumbar region with neurogenic claudication: Secondary | ICD-10-CM

## 2023-12-11 ENCOUNTER — Other Ambulatory Visit: Payer: Self-pay | Admitting: Family Medicine

## 2023-12-11 DIAGNOSIS — M48062 Spinal stenosis, lumbar region with neurogenic claudication: Secondary | ICD-10-CM

## 2023-12-11 DIAGNOSIS — I10 Essential (primary) hypertension: Secondary | ICD-10-CM

## 2023-12-11 MED ORDER — HYDROCHLOROTHIAZIDE 12.5 MG PO TABS: 12.5000 mg | ORAL_TABLET | Freq: Every day | ORAL | 3 refills | Status: AC

## 2023-12-11 MED ORDER — GABAPENTIN 300 MG PO CAPS: ORAL_CAPSULE | ORAL | 3 refills | Status: AC

## 2023-12-11 NOTE — Telephone Encounter (Signed)
 Copied from CRM [id number]. Topic: Clinical - Medication Refill >> Dec 11, 2023  9:41 AM Nada Libman H wrote: Most Recent Primary Care Visit:  Provider: Sandre Kitty  Department: [facility]  Visit Type: OFFICE VISIT  Date: 11/30/2023  Medication: gabapentin (NEURONTIN) 300 MG capsule [[id number]] hydrochlorothiazide (HYDRODIURIL) 12.5 MG tablet [[id number]]  Has the patient contacted their pharmacy? No (Agent: If no, request that the patient contact the pharmacy for the refill. If patient does not wish to contact the pharmacy document the reason why and proceed with request.) (Agent: If yes, when and what did the pharmacy advise?)  Is this the correct pharmacy for this prescription? Yes If no, delete pharmacy and type the correct one.  This is the patient's preferred pharmacy:  [facility] [address] [place] - [address] [place] [address] Phone: [phone] Fax: [phone]   Has the prescription been filled recently? No  Is the patient out of the medication? Yes  Has the patient been seen for an appointment in the last year OR does the patient have an upcoming appointment? Yes  Can we respond through MyChart? No  Agent: Please be advised that Rx refills may take up to 3 business days. We ask that you follow-up with your pharmacy.

## 2023-12-13 ENCOUNTER — Other Ambulatory Visit: Payer: PPO

## 2023-12-13 ENCOUNTER — Other Ambulatory Visit: Payer: Self-pay | Admitting: *Deleted

## 2023-12-13 DIAGNOSIS — R7303 Prediabetes: Secondary | ICD-10-CM

## 2023-12-13 DIAGNOSIS — I10 Essential (primary) hypertension: Secondary | ICD-10-CM

## 2023-12-13 DIAGNOSIS — E785 Hyperlipidemia, unspecified: Secondary | ICD-10-CM

## 2023-12-14 ENCOUNTER — Other Ambulatory Visit

## 2023-12-14 DIAGNOSIS — R7303 Prediabetes: Secondary | ICD-10-CM

## 2023-12-14 DIAGNOSIS — E785 Hyperlipidemia, unspecified: Secondary | ICD-10-CM | POA: Diagnosis not present

## 2023-12-14 DIAGNOSIS — I10 Essential (primary) hypertension: Secondary | ICD-10-CM

## 2023-12-15 LAB — COMPREHENSIVE METABOLIC PANEL
ALT: 18 IU/L (ref 0–44)
AST: 19 IU/L (ref 0–40)
Albumin: 3.6 g/dL — ABNORMAL LOW (ref 3.7–4.7)
Alkaline Phosphatase: 69 IU/L (ref 44–121)
BUN/Creatinine Ratio: 19 (ref 10–24)
BUN: 19 mg/dL (ref 8–27)
Bilirubin Total: 0.6 mg/dL (ref 0.0–1.2)
CO2: 26 mmol/L (ref 20–29)
Calcium: 9.2 mg/dL (ref 8.6–10.2)
Chloride: 102 mmol/L (ref 96–106)
Creatinine, Ser: 0.98 mg/dL (ref 0.76–1.27)
Globulin, Total: 1.6 g/dL (ref 1.5–4.5)
Glucose: 92 mg/dL (ref 70–99)
Potassium: 4.9 mmol/L (ref 3.5–5.2)
Sodium: 142 mmol/L (ref 134–144)
Total Protein: 5.2 g/dL — ABNORMAL LOW (ref 6.0–8.5)
eGFR: 76 mL/min/{1.73_m2} (ref >59)

## 2023-12-15 LAB — HEMOGLOBIN A1C
Est. average glucose Bld gHb Est-mCnc: 117 mg/dL
Hgb A1c MFr Bld: 5.7 % — ABNORMAL HIGH (ref 4.8–5.6)

## 2023-12-15 LAB — CBC WITH DIFFERENTIAL/PLATELET
Basophils Absolute: 0.2 10*3/uL (ref 0.0–0.2)
Basos: 2 %
EOS (ABSOLUTE): 0.4 10*3/uL (ref 0.0–0.4)
Eos: 4 %
Hematocrit: 43.2 % (ref 37.5–51.0)
Hemoglobin: 14.6 g/dL (ref 13.0–17.7)
Immature Grans (Abs): 0 10*3/uL (ref 0.0–0.1)
Immature Granulocytes: 0 %
Lymphocytes Absolute: 2.2 10*3/uL (ref 0.7–3.1)
Lymphs: 22 %
MCH: 32.1 pg (ref 26.6–33.0)
MCHC: 33.8 g/dL (ref 31.5–35.7)
MCV: 95 fL (ref 79–97)
Monocytes Absolute: 0.7 10*3/uL (ref 0.1–0.9)
Monocytes: 7 %
Neutrophils Absolute: 6.4 10*3/uL (ref 1.4–7.0)
Neutrophils: 65 %
Platelets: 819 10*3/uL (ref 150–450)
RBC: 4.55 x10E6/uL (ref 4.14–5.80)
RDW: 13.6 % (ref 11.6–15.4)
WBC: 9.8 10*3/uL (ref 3.4–10.8)

## 2023-12-15 LAB — LIPID PANEL
Chol/HDL Ratio: 3.1 ratio (ref 0.0–5.0)
Cholesterol, Total: 148 mg/dL (ref 100–199)
HDL: 47 mg/dL (ref >39)
LDL Chol Calc (NIH): 85 mg/dL (ref 0–99)
Triglycerides: 82 mg/dL (ref 0–149)
VLDL Cholesterol Cal: 16 mg/dL (ref 5–40)

## 2023-12-15 NOTE — Telephone Encounter (Signed)
 Patient called back concerning the refill medication . Relayed to patient that they confirmed the prescription at [facility] on the 17th . Patient say he will give them a call and he have any problems he will call us back

## 2023-12-18 ENCOUNTER — Other Ambulatory Visit: Payer: Self-pay | Admitting: Family Medicine

## 2023-12-18 DIAGNOSIS — M48062 Spinal stenosis, lumbar region with neurogenic claudication: Secondary | ICD-10-CM

## 2023-12-21 ENCOUNTER — Encounter: Payer: Self-pay | Admitting: Family Medicine

## 2023-12-21 ENCOUNTER — Ambulatory Visit (INDEPENDENT_AMBULATORY_CARE_PROVIDER_SITE_OTHER): Payer: PPO | Admitting: Family Medicine

## 2023-12-21 VITALS — BP 109/55 | HR 68 | Ht 72.0 in | Wt 166.8 lb

## 2023-12-21 DIAGNOSIS — D75839 Thrombocytosis, unspecified: Secondary | ICD-10-CM | POA: Diagnosis not present

## 2023-12-21 DIAGNOSIS — R770 Abnormality of albumin: Secondary | ICD-10-CM | POA: Diagnosis not present

## 2023-12-21 NOTE — Patient Instructions (Signed)
 It was nice to see you today,  We addressed the following topics today: I would like a hematologist to evaluate your platelet levels.  They have been gradually increasing in appearance over time.  I would also like to test your iron levels to rule out iron deficiency as a cause of your elevated platelets. - For low albumin I would recommend trying to do more strength training exercise and eating more protein.  Try for a goal of 70 g of protein a day in your diet.  To help with this you can get a whey protein powder and mix it with water or what ever else you would like to mix it with. - Continue taking your other medications. - Follow-up in 6 months.  Have a great day,  Frederic Jericho, MD

## 2023-12-21 NOTE — Assessment & Plan Note (Signed)
 Several years ago patient saw hematology for thrombocytopenia.  It was considered benign and his platelets were monitored yearly but no other treatment required.  Since that time, he has gradually had an increase in his platelets.  The last few have been elevated and today's was significantly elevated over 800,000.  Will get ferritin levels and PT/INR, PTT and refer to hematology for continued workup.

## 2023-12-21 NOTE — Progress Notes (Signed)
   Established Patient Office Visit  Subjective   Patient ID: Larry Harris, male    DOB: 11/27/1940  Age: 83 y.o. MRN: [id number]  Chief Complaint  Patient presents with   Annual Exam    HPI  Patient states that his sinus infection has improved.  Now has some slight sinus complaints attributed to seasonal allergies for which she takes Claritin and Flonase.  We discussed the patient's lab testing including the elevated platelets.  He has previously seen a oncologist in [place] for low platelets several years ago but this was found to be benign.  Attributed to clumping of the platelets.  He has since had a gradual increase and now was recently had over 800,000.  He is agreeable to referral.  We talked about the patient's albumin level which was slightly low but stable over the past several years.  Encourage patient to do more exercise and strength training and increase his protein intake.  Recommended supplementing with protein shakes if needed.   The ASCVD Risk score (Arnett DK, et al., 2019) failed to calculate for the following reasons:   The 2019 ASCVD risk score is only valid for ages 75 to 44  Health Maintenance Due  Topic Date Due   COVID-19 Vaccine (5 - 2024-25 season) 08/14/2023      Objective:     BP (!) 109/55   Pulse 68   Ht 6' (1.829 m)   Wt 166 lb 12.8 oz (75.7 kg)   SpO2 99%   BMI 22.62 kg/m    Physical Exam General: Alert, oriented Pulmonary: No respiratory distress Psych: Pleasant affect.   No results found for any visits on 12/21/23.      Assessment & Plan:   Thrombocytosis Assessment & Plan: Several years ago patient saw hematology for thrombocytopenia.  It was considered benign and his platelets were monitored yearly but no other treatment required.  Since that time, he has gradually had an increase in his platelets.  The last few have been elevated and today's was significantly elevated over 800,000.  Will get ferritin levels and  PT/INR, PTT and refer to hematology for continued workup.  Orders: -     Ambulatory referral to Hematology / Oncology -     Iron, TIBC and Ferritin Panel; Future -     Protime-INR; Future -     APTT; Future  Low serum albumin Assessment & Plan: Discussed with patient that his serum albumin has been stable but mildly low for the past several years.  Encouraged increased dietary protein intake.Marland Kitchen      Return in about 6 months (around 06/22/2024) for HTN, platelets.    Sandre Kitty, MD

## 2023-12-21 NOTE — Assessment & Plan Note (Signed)
 Discussed with patient that his serum albumin has been stable but mildly low for the past several years.  Encouraged increased dietary protein intake.Marland Kitchen

## 2023-12-25 ENCOUNTER — Other Ambulatory Visit

## 2023-12-25 DIAGNOSIS — D75839 Thrombocytosis, unspecified: Secondary | ICD-10-CM | POA: Diagnosis not present

## 2023-12-26 LAB — IRON,TIBC AND FERRITIN PANEL
Ferritin: 170 ng/mL (ref 30–400)
Iron Saturation: 42 % (ref 15–55)
Iron: 116 ug/dL (ref 38–169)
Total Iron Binding Capacity: 275 ug/dL (ref 250–450)
UIBC: 159 ug/dL (ref 111–343)

## 2023-12-26 LAB — PROTIME-INR
INR: 1.2 (ref 0.9–1.2)
Prothrombin Time: 13 s — ABNORMAL HIGH (ref 9.1–12.0)

## 2023-12-26 LAB — APTT: aPTT: 28 s (ref 24–33)

## 2023-12-28 ENCOUNTER — Inpatient Hospital Stay: Admitting: Hematology and Oncology

## 2023-12-28 ENCOUNTER — Encounter: Admitting: Hematology and Oncology

## 2023-12-28 ENCOUNTER — Inpatient Hospital Stay: Attending: Hematology and Oncology

## 2023-12-28 ENCOUNTER — Encounter: Payer: Self-pay | Admitting: Hematology and Oncology

## 2023-12-28 VITALS — BP 138/62 | HR 68 | Temp 97.6°F | Resp 16 | Ht 72.0 in | Wt 168.5 lb

## 2023-12-28 DIAGNOSIS — Z87891 Personal history of nicotine dependence: Secondary | ICD-10-CM

## 2023-12-28 DIAGNOSIS — Z8042 Family history of malignant neoplasm of prostate: Secondary | ICD-10-CM

## 2023-12-28 DIAGNOSIS — Z801 Family history of malignant neoplasm of trachea, bronchus and lung: Secondary | ICD-10-CM | POA: Insufficient documentation

## 2023-12-28 DIAGNOSIS — D75839 Thrombocytosis, unspecified: Secondary | ICD-10-CM | POA: Diagnosis not present

## 2023-12-28 DIAGNOSIS — Z148 Genetic carrier of other disease: Secondary | ICD-10-CM | POA: Insufficient documentation

## 2023-12-28 DIAGNOSIS — D473 Essential (hemorrhagic) thrombocythemia: Secondary | ICD-10-CM | POA: Insufficient documentation

## 2023-12-28 DIAGNOSIS — D471 Chronic myeloproliferative disease: Secondary | ICD-10-CM

## 2023-12-28 DIAGNOSIS — I10 Essential (primary) hypertension: Secondary | ICD-10-CM | POA: Diagnosis not present

## 2023-12-28 LAB — CMP (CANCER CENTER ONLY)
ALT: 18 U/L (ref 0–44)
AST: 18 U/L (ref 15–41)
Albumin: 3.5 g/dL (ref 3.5–5.0)
Alkaline Phosphatase: 54 U/L (ref 38–126)
Anion gap: 3 — ABNORMAL LOW (ref 5–15)
BUN: 17 mg/dL (ref 8–23)
CO2: 36 mmol/L — ABNORMAL HIGH (ref 22–32)
Calcium: 9 mg/dL (ref 8.9–10.3)
Chloride: 102 mmol/L (ref 98–111)
Creatinine: 0.98 mg/dL (ref 0.61–1.24)
GFR, Estimated: >60 mL/min (ref >60)
Glucose, Bld: 97 mg/dL (ref 70–99)
Potassium: 4.5 mmol/L (ref 3.5–5.1)
Sodium: 141 mmol/L (ref 135–145)
Total Bilirubin: 0.6 mg/dL (ref 0.0–1.2)
Total Protein: 5.3 g/dL — ABNORMAL LOW (ref 6.5–8.1)

## 2023-12-28 LAB — LACTATE DEHYDROGENASE: LDH: 214 U/L — ABNORMAL HIGH (ref 98–192)

## 2023-12-28 LAB — CBC WITH DIFFERENTIAL (CANCER CENTER ONLY)
Abs Immature Granulocytes: 0.03 10*3/uL (ref 0.00–0.07)
Basophils Absolute: 0.1 10*3/uL (ref 0.0–0.1)
Basophils Relative: 1 %
Eosinophils Absolute: 0.4 10*3/uL (ref 0.0–0.5)
Eosinophils Relative: 5 %
HCT: 42.9 % (ref 39.0–52.0)
Hemoglobin: 14.3 g/dL (ref 13.0–17.0)
Immature Granulocytes: 0 %
Lymphocytes Relative: 24 %
Lymphs Abs: 2.3 10*3/uL (ref 0.7–4.0)
MCH: 32.3 pg (ref 26.0–34.0)
MCHC: 33.3 g/dL (ref 30.0–36.0)
MCV: 96.8 fL (ref 80.0–100.0)
Monocytes Absolute: 0.7 10*3/uL (ref 0.1–1.0)
Monocytes Relative: 7 %
Neutro Abs: 6.2 10*3/uL (ref 1.7–7.7)
Neutrophils Relative %: 63 %
Platelet Count: 760 10*3/uL — ABNORMAL HIGH (ref 150–400)
RBC: 4.43 MIL/uL (ref 4.22–5.81)
RDW: 13.8 % (ref 11.5–15.5)
WBC Count: 9.8 10*3/uL (ref 4.0–10.5)
nRBC: 0 % (ref 0.0–0.2)

## 2023-12-28 LAB — URIC ACID: Uric Acid, Serum: 6 mg/dL (ref 3.7–8.6)

## 2023-12-28 NOTE — Assessment & Plan Note (Signed)
 I have reviewed his electronic records in great detail I am concerned that the severe thrombocytosis seen is likely due to undiagnosed myeloproliferative disorder such as essential thrombocytosis I recommend additional blood work today I recommend the patient to continue aspirin therapy to reduce risk of thrombosis We discussed importance of drinking adequate amount of fluid, minimum 80 ounces per day I will see him back in a few weeks with test results

## 2023-12-28 NOTE — Progress Notes (Signed)
 [facility] CONSULT NOTE  Patient Care Team: Sandre Kitty, MD as PCP - General (Family Medicine) Jodelle Red, MD as PCP - Cardiology (Cardiology)   ASSESSMENT & PLAN  Myeloproliferative neoplasm [facility]) I have reviewed his electronic records in great detail I am concerned that the severe thrombocytosis seen is likely due to undiagnosed myeloproliferative disorder such as essential thrombocytosis I recommend additional blood work today I recommend the patient to continue aspirin therapy to reduce risk of thrombosis We discussed importance of drinking adequate amount of fluid, minimum 80 ounces per day I will see him back in a few weeks with test results  Orders Placed This Encounter  Procedures   CMP (Cancer Center only)    Standing Status:   Future    Number of Occurrences:   1    Expiration Date:   12/27/2024   CBC with Differential (Cancer Center Only)    Standing Status:   Future    Number of Occurrences:   1    Expiration Date:   12/27/2024   Uric acid    Standing Status:   Future    Number of Occurrences:   1    Expiration Date:   12/27/2024   Lactate dehydrogenase    Standing Status:   Future    Number of Occurrences:   1    Expiration Date:   12/27/2024   NGS JAK2 V617F/CALR/MPL    Standing Status:   Future    Number of Occurrences:   1    Expiration Date:   12/27/2024   Myeloproliferative Neoplasms/ CML, FISH    Standing Status:   Future    Number of Occurrences:   1    Expiration Date:   12/27/2024   Miscellaneous [facility] test (send-out)    Standing Status:   Future    Number of Occurrences:   1    Expected Date:   12/28/2023    Expiration Date:   12/27/2024    Test name / description::   for NGS JAK2/CALR/MPL    All questions were answered. The patient knows to call the clinic with any problems, questions or concerns. I spent 60 minutes counseling the patient face to face, counseling and review of plan of care.   Artis Delay, MD 12/28/2023 2:54  PM   CHIEF COMPLAINTS/PURPOSE OF CONSULTATION:  High platelet count  HISTORY OF PRESENTING ILLNESS:  Larry Harris 83 y.o. male is here because of elevated platelet count.  He was found to have abnormal CBC from recent routine blood count monitoring I have the opportunity to review his CBC dated back to 2017 On July 25, 2016, platelet count was 88 On August 09, 2017, platelet count was 127 On August 16, 2018, platelet count was 108 On August 26, 2019, platelet count was 141 On February 26, 2020, platelet count was 166 On October 05, 2020, platelet count was 261 On December 13, 2021, platelet count was 441 On September 07, 2022, platelet count was 522 On December 19, 2022, platelet count was 632 On June 23, 2023, platelet count was 594 On December 14, 2023, platelet count was 819  Approximately 30 years ago, he was noted to have platelet clumping and low platelet count He was seen by a hematologist elsewhere with aggressive workup that came back normal He was recommended to take aspirin therapy at 325 mg daily and he has been doing that for the last 30 years  He denies recent infection. The last prescription  antibiotics was more than 3 months ago He did not have prior splenectomy There is not reported symptoms of sinus congestion, cough, urinary frequency/urgency or dysuria, diarrhea, joint swelling/pain or abnormal skin rash.  He had no prior history or diagnosis of cancer except for basal cell carcinoma removed from his scalp. His age appropriate screening programs are up-to-date. Patient has never been diagnosed with thrombotic events Of note, he only drink approximately 6 ounces of water per day  MEDICAL HISTORY:  Past Medical History:  Diagnosis Date   Acute rhinitis    Allergic rhinitis, cause unspecified    Allergy    BPH (benign prostatic hyperplasia)    CAD (coronary artery disease)    Cancer (HCC)    scalp basal cell skin ca   Cataract    bilateral cateracts  removed   Clotting disorder (HCC)    Hypercholesteremia    Hypertension    Hypotension    Neuralgia, neuritis, and radiculitis, unspecified    Osteoarthritis    Spinal stenosis of lumbar region 06/04/2014   Thrombocytopenia, unspecified (HCC)     SURGICAL HISTORY: Past Surgical History:  Procedure Laterality Date   APPENDECTOMY  1958   bilateral cateracts removed     COLONOSCOPY     EYE SURGERY N/A 2010  & 2014   [employer] 12/13/2020   HERNIA REPAIR  1965   x2    SOCIAL HISTORY: Social History   Socioeconomic History   Marital status: Widowed    Spouse name: Not on file   Number of children: 2   Years of education: college 4   Highest education level: Bachelor's degree (e.g., BA, AB, BS)  Occupational History   Occupation: Retired  Tobacco Use   Smoking status: Former    Current packs/day: 0.00    Average packs/day: 1 pack/day for 25.0 years (25.0 ttl pk-yrs)    Types: Cigarettes    Start date: 07/27/1969    Quit date: 07/27/1994    Years since quitting: 29.4   Smokeless tobacco: Never  Vaping Use   Vaping status: Never Used  Substance and Sexual Activity   Alcohol use: Yes    Alcohol/week: 1.0 standard drink of alcohol    Types: 1 Glasses of wine per week    Comment: occasional   Drug use: No   Sexual activity: Not Currently  Other Topics Concern   Not on file  Social History Narrative   The patient is widowed. He is retired from Airline pilot. He quit smoking in 1995. He has no history of alcohol use. He has previously done regular exercise until developing a back problem.      Patient drinks about 3-4 cups of coffee daily.   Patient is right handed.    Social Drivers of Corporate investment banker Strain: Low Risk  (09/27/2023)   Overall Financial Resource Strain (CARDIA)    Difficulty of Paying Living Expenses: Not hard at all  Food Insecurity: No Food Insecurity (09/27/2023)   Hunger Vital Sign    Worried About Running Out of Food in the Last Year: Never true     Ran Out of Food in the Last Year: Never true  Transportation Needs: No Transportation Needs (09/27/2023)   PRAPARE - Administrator, Civil Service (Medical): No    Lack of Transportation (Non-Medical): No  Physical Activity: Sufficiently Active (09/27/2023)   Exercise Vital Sign    Days of Exercise per Week: 6 days    Minutes of Exercise per Session:  30 min  Stress: No Stress Concern Present (09/27/2023)   [employer] of Occupational Health - Occupational Stress Questionnaire    Feeling of Stress : Not at all  Social Connections: Moderately Integrated (09/27/2023)   Social Connection and Isolation Panel [NHANES]    Frequency of Communication with Friends and Family: More than three times a week    Frequency of Social Gatherings with Friends and Family: Twice a week    Attends Religious Services: 1 to 4 times per year    Active Member of [employer] or Organizations: Yes    Attends Banker Meetings: More than 4 times per year    Marital Status: Widowed  Intimate Partner Violence: Not At Risk (06/27/2023)   Humiliation, Afraid, Rape, and Kick questionnaire    Fear of Current or Ex-Partner: No    Emotionally Abused: No    Physically Abused: No    Sexually Abused: No    FAMILY HISTORY: Family History  Problem Relation Age of Onset   Stroke Mother 60   Prostate cancer Father 68   Heart failure Brother 63   Lung cancer Sister    Colon cancer Neg Hx    Esophageal cancer Neg Hx    Rectal cancer Neg Hx    Stomach cancer Neg Hx     ALLERGIES:  has no known allergies.  MEDICATIONS:  Current Outpatient Medications  Medication Sig Dispense Refill   aspirin 325 MG tablet Take 325 mg by mouth daily.     b complex vitamins tablet Take 1 tablet by mouth daily.     calcium carbonate (OS-CAL) 600 MG TABS Take 600 mg by mouth daily.     diphenhydrAMINE (BENADRYL) 25 mg capsule Take 25 mg by mouth as needed (insomnia).     doxazosin (CARDURA) 2 MG tablet TAKE 1 TABLET  BY MOUTH EVERYDAY AT BEDTIME 90 tablet 3   fluticasone (FLONASE) 50 MCG/ACT nasal spray SPRAY 2 SPRAYS INTO EACH NOSTRIL EVERY DAY 48 mL 1   fluticasone (FLONASE) 50 MCG/ACT nasal spray Place 2 sprays into both nostrils daily. 16 g 6   gabapentin (NEURONTIN) 300 MG capsule TAKE 1 CAPSULE BY MOUTH THREE TIMES A DAY 270 capsule 3   hydrochlorothiazide (HYDRODIURIL) 12.5 MG tablet Take 1 tablet (12.5 mg total) by mouth daily. 90 tablet 3   hydrocortisone (ANUSOL-HC) 25 MG suppository Place 1 suppository (25 mg total) rectally 2 (two) times daily. 12 suppository 5   loratadine (CLARITIN REDITABS) 10 MG dissolvable tablet Take 1 tablet (10 mg total) by mouth daily. As needed for allergy symptoms 31 tablet 12   Multiple Vitamin (MULTIVITAMIN) capsule Take 1 capsule by mouth daily.     vitamin B-12 (CYANOCOBALAMIN) 1000 MCG tablet Take 1,000 mcg by mouth daily.     VITAMIN D, CHOLECALCIFEROL, PO Take 1,000 Units by mouth daily.     No current facility-administered medications for this visit.    REVIEW OF SYSTEMS:   Constitutional: Denies fevers, chills or abnormal night sweats Eyes: Denies blurriness of vision, double vision or watery eyes Ears, nose, mouth, throat, and face: Denies mucositis or sore throat Respiratory: Denies cough, dyspnea or wheezes Cardiovascular: Denies palpitation, chest discomfort or lower extremity swelling Gastrointestinal:  Denies nausea, heartburn or change in bowel habits Skin: Denies abnormal skin rashes Lymphatics: Denies new lymphadenopathy or easy bruising Neurological:Denies numbness, tingling or new weaknesses Behavioral/Psych: Mood is stable, no new changes  All other systems were reviewed with the patient and are negative.  PHYSICAL EXAMINATION:  ECOG PERFORMANCE STATUS: 0 - Asymptomatic  Vitals:   12/28/23 1305  BP: 138/62  Pulse: 68  Resp: 16  Temp: 97.6 F (36.4 C)  SpO2: 100%   Filed Weights   12/28/23 1305  Weight: 168 lb 8 oz (76.4 kg)     GENERAL:alert, no distress and comfortable SKIN: skin color, texture, turgor are normal, no rashes or significant lesions EYES: normal, conjunctiva are pink and non-injected, sclera clear OROPHARYNX:no exudate, no erythema and lips, buccal mucosa, and tongue normal  NECK: supple, thyroid normal size, non-tender, without nodularity LYMPH:  no palpable lymphadenopathy in the cervical, axillary or inguinal LUNGS: clear to auscultation and percussion with normal breathing effort HEART: regular rate & rhythm and no murmurs and no lower extremity edema ABDOMEN:abdomen soft, non-tender and normal bowel sounds Musculoskeletal:no cyanosis of digits and no clubbing  PSYCH: alert & oriented x 3 with fluent speech NEURO: no focal motor/sensory deficits  LABORATORY DATA:  I have reviewed the data as listed Recent Results (from the past 2160 hours)  Lipid Panel     Status: None   Collection Time: 12/14/23  9:50 AM  Result Value Ref Range   Cholesterol, Total 148 100 - 199 mg/dL   Triglycerides 82 0 - 149 mg/dL   HDL 47 >16 mg/dL   VLDL Cholesterol Cal 16 5 - 40 mg/dL   LDL Chol Calc (NIH) 85 0 - 99 mg/dL   Chol/HDL Ratio 3.1 0.0 - 5.0 ratio    Comment:                                   T. Chol/HDL Ratio                                             Men  Women                               1/2 Avg.Risk  3.4    3.3                                   Avg.Risk  5.0    4.4                                2X Avg.Risk  9.6    7.1                                3X Avg.Risk 23.4   11.0   HgB A1c     Status: Abnormal   Collection Time: 12/14/23  9:50 AM  Result Value Ref Range   Hgb A1c MFr Bld 5.7 (H) 4.8 - 5.6 %    Comment:          Prediabetes: 5.7 - 6.4          Diabetes: >6.4          Glycemic control for adults with diabetes: <7.0    Est. average glucose Bld gHb Est-mCnc 117 mg/dL  CBC with Differential/Platelet     Status: Abnormal   Collection Time: 12/14/23  9:50 AM  Result Value Ref  Range   WBC 9.8 3.4 - 10.8 x10E3/uL   RBC 4.55 4.14 - 5.80 x10E6/uL   Hemoglobin 14.6 13.0 - 17.7 g/dL   Hematocrit 40.9 81.1 - 51.0 %   MCV 95 79 - 97 fL   MCH 32.1 26.6 - 33.0 pg   MCHC 33.8 31.5 - 35.7 g/dL   RDW 91.4 78.2 - 95.6 %   Platelets 819 (HH) 150 - 450 x10E3/uL   Neutrophils 65 Not Estab. %   Lymphs 22 Not Estab. %   Monocytes 7 Not Estab. %   Eos 4 Not Estab. %   Basos 2 Not Estab. %   Neutrophils Absolute 6.4 1.4 - 7.0 x10E3/uL   Lymphocytes Absolute 2.2 0.7 - 3.1 x10E3/uL   Monocytes Absolute 0.7 0.1 - 0.9 x10E3/uL   EOS (ABSOLUTE) 0.4 0.0 - 0.4 x10E3/uL   Basophils Absolute 0.2 0.0 - 0.2 x10E3/uL   Immature Granulocytes 0 Not Estab. %   Immature Grans (Abs) 0.0 0.0 - 0.1 x10E3/uL  Comp Met (CMET)     Status: Abnormal   Collection Time: 12/14/23  9:50 AM  Result Value Ref Range   Glucose 92 70 - 99 mg/dL   BUN 19 8 - 27 mg/dL   Creatinine, Ser 2.13 0.76 - 1.27 mg/dL   eGFR 76 >08 MV/HQI/6.96   BUN/Creatinine Ratio 19 10 - 24   Sodium 142 134 - 144 mmol/L   Potassium 4.9 3.5 - 5.2 mmol/L   Chloride 102 96 - 106 mmol/L   CO2 26 20 - 29 mmol/L   Calcium 9.2 8.6 - 10.2 mg/dL   Total Protein 5.2 (L) 6.0 - 8.5 g/dL   Albumin 3.6 (L) 3.7 - 4.7 g/dL   Globulin, Total 1.6 1.5 - 4.5 g/dL   Bilirubin Total 0.6 0.0 - 1.2 mg/dL   Alkaline Phosphatase 69 44 - 121 IU/L   AST 19 0 - 40 IU/L   ALT 18 0 - 44 IU/L  APTT     Status: None   Collection Time: 12/25/23  9:45 AM  Result Value Ref Range   aPTT 28 24 - 33 sec    Comment: This test has not been validated for monitoring unfractionated heparin therapy. aPTT-based therapeutic ranges for unfractionated heparin therapy have not been established. For general guidelines on Heparin monitoring, refer to the [employer].   Iron, TIBC and Ferritin Panel     Status: None   Collection Time: 12/25/23  9:45 AM  Result Value Ref Range   Total Iron Binding Capacity 275 250 - 450 ug/dL   UIBC 295 284 - 132  ug/dL   Iron 440 38 - 102 ug/dL   Iron Saturation 42 15 - 55 %   Ferritin 170 30 - 400 ng/mL  Protime-INR     Status: Abnormal   Collection Time: 12/25/23  9:45 AM  Result Value Ref Range   INR 1.2 0.9 - 1.2    Comment: Reference interval is for non-anticoagulated patients. Suggested INR therapeutic range for Vitamin K antagonist therapy:    Standard Dose (moderate intensity                   therapeutic range):       2.0 - 3.0    Higher intensity therapeutic range       2.5 - 3.5    Prothrombin Time 13.0 (H) 9.1 - 12.0 sec  Lactate dehydrogenase     Status:  Abnormal   Collection Time: 12/28/23  1:35 PM  Result Value Ref Range   LDH 214 (H) 98 - 192 U/L    Comment: Performed at [facility], [address], [place], [place] [address]  Uric acid     Status: None   Collection Time: 12/28/23  1:35 PM  Result Value Ref Range   Uric Acid, Serum 6.0 3.7 - 8.6 mg/dL    Comment: Performed at [facility], [address], [place], [place] [address]  CBC with Differential (Cancer Center Only)     Status: Abnormal   Collection Time: 12/28/23  1:35 PM  Result Value Ref Range   WBC Count 9.8 4.0 - 10.5 K/uL   RBC 4.43 4.22 - 5.81 MIL/uL   Hemoglobin 14.3 13.0 - 17.0 g/dL   HCT 09.8 11.9 - 14.7 %   MCV 96.8 80.0 - 100.0 fL   MCH 32.3 26.0 - 34.0 pg   MCHC 33.3 30.0 - 36.0 g/dL   RDW 82.9 56.2 - 13.0 %   Platelet Count 760 (H) 150 - 400 K/uL   nRBC 0.0 0.0 - 0.2 %   Neutrophils Relative % 63 %   Neutro Abs 6.2 1.7 - 7.7 K/uL   Lymphocytes Relative 24 %   Lymphs Abs 2.3 0.7 - 4.0 K/uL   Monocytes Relative 7 %   Monocytes Absolute 0.7 0.1 - 1.0 K/uL   Eosinophils Relative 5 %   Eosinophils Absolute 0.4 0.0 - 0.5 K/uL   Basophils Relative 1 %   Basophils Absolute 0.1 0.0 - 0.1 K/uL   Immature Granulocytes 0 %   Abs Immature Granulocytes 0.03 0.00 - 0.07 K/uL    Comment: Performed at [facility], [address], [place], [place] [address]  CMP (Cancer Center only)     Status: Abnormal   Collection Time: 12/28/23  1:35 PM  Result Value Ref Range   Sodium 141 135 - 145 mmol/L   Potassium 4.5 3.5 - 5.1 mmol/L   Chloride 102 98 - 111 mmol/L   CO2 36 (H) 22 - 32 mmol/L   Glucose, Bld 97 70 - 99 mg/dL    Comment: Glucose reference range applies only to samples taken after fasting for at least 8 hours.   BUN 17 8 - 23 mg/dL   Creatinine 4.69 6.29 - 1.24 mg/dL   Calcium 9.0 8.9 - 52.8 mg/dL   Total Protein 5.3 (L) 6.5 - 8.1 g/dL   Albumin 3.5 3.5 - 5.0 g/dL   AST 18 15 - 41 U/L   ALT 18 0 - 44 U/L   Alkaline Phosphatase 54 38 - 126 U/L   Total Bilirubin 0.6 0.0 - 1.2 mg/dL   GFR, Estimated >41 >32 mL/min    Comment: (NOTE) Calculated using the CKD-EPI Creatinine Equation (2021)    Anion gap 3 (L) 5 - 15    Comment: Performed at [facility], [address], [place], [place] [address]

## 2024-01-02 LAB — MPN/CML FISH

## 2024-01-04 ENCOUNTER — Telehealth: Payer: Self-pay

## 2024-01-04 LAB — MISC LABCORP TEST (SEND OUT): Labcorp test code: 489615

## 2024-01-04 NOTE — Telephone Encounter (Signed)
 Called and offered appt at 1200 today with Dr. Bertis Ruddy. He will keep appt as scheduled and declined an earlier appt.

## 2024-01-18 NOTE — Assessment & Plan Note (Signed)
 The patient was noted to have progressive elevated platelet count since 2023 In April 2025, peripheral blood for JAK2 mutation was positive.  MPL, CAL are and test for CML was negative Overall, his diagnosis is most consistent with essential thrombocytosis We discussed natural history of essential thrombocytosis I recommend starting him on hydroxyurea today, on 01/19/2024 with 500 mg daily He will continue aspirin therapy Plan to see him next week for further follow-up

## 2024-01-19 ENCOUNTER — Inpatient Hospital Stay: Admitting: Hematology and Oncology

## 2024-01-19 ENCOUNTER — Encounter: Payer: Self-pay | Admitting: Hematology and Oncology

## 2024-01-19 VITALS — BP 175/71 | HR 66 | Temp 97.4°F | Resp 18 | Ht 72.0 in | Wt 170.4 lb

## 2024-01-19 DIAGNOSIS — I10 Essential (primary) hypertension: Secondary | ICD-10-CM | POA: Diagnosis not present

## 2024-01-19 DIAGNOSIS — D471 Chronic myeloproliferative disease: Secondary | ICD-10-CM

## 2024-01-19 DIAGNOSIS — R7989 Other specified abnormal findings of blood chemistry: Secondary | ICD-10-CM | POA: Diagnosis not present

## 2024-01-19 MED ORDER — HYDROXYUREA 500 MG PO CAPS: 500.0000 mg | ORAL_CAPSULE | Freq: Every day | ORAL | 3 refills | Status: DC

## 2024-01-19 NOTE — Assessment & Plan Note (Addendum)
 His blood pressure is higher than usual likely due to anxiety He will continue close monitoring of his blood pressure at home

## 2024-01-19 NOTE — Progress Notes (Signed)
 [facility] OFFICE PROGRESS NOTE  Patient Care Team: Laneta Pintos, MD as PCP - General (Family Medicine) Sheryle Donning, MD as PCP - Cardiology (Cardiology)  Assessment & Plan Myeloproliferative neoplasm [facility]) The patient was noted to have progressive elevated platelet count since 2023 In April 2025, peripheral blood for JAK2 mutation was positive.  MPL, CAL are and test for CML was negative Overall, his diagnosis is most consistent with essential thrombocytosis We discussed natural history of essential thrombocytosis I recommend starting him on hydroxyurea today, on 01/19/2024 with 500 mg daily He will continue aspirin therapy Plan to see him next week for further follow-up Low serum total protein level He has 30 pounds of intentional weight loss However, his serum protein is low Recommend high-protein diet Essential hypertension His blood pressure is higher than usual likely due to anxiety He will continue close monitoring of his blood pressure at home  Orders Placed This Encounter  Procedures   CBC with Differential/Platelet    Standing Status:   Standing    Number of Occurrences:   22    Expiration Date:   01/18/2025     Almeda Jacobs, MD  INTERVAL HISTORY: he returns for surveillance follow-up for myeloproliferative disorder/neoplasm He is here accompanied by his daughter, Trevor Fudge I gave him multiple copies of test results He has diagnosis of essential thrombocytosis We discussed risk and benefits of hydroxyurea Patient denies recent bleeding such as epistaxis, hematuria or hematochezia No recent infection We reviewed medication list and discussed medication changes We discussed test results and future plan of care as outlined above  PHYSICAL EXAMINATION: ECOG PERFORMANCE STATUS: 0 - Asymptomatic  Vitals:   01/19/24 1132  BP: (!) 175/71  Pulse: 66  Resp: 18  Temp: (!) 97.4 F (36.3 C)  SpO2: 100%   Lab Results  Component Value Date    WBC 9.8 12/28/2023   HGB 14.3 12/28/2023   HCT 42.9 12/28/2023   MCV 96.8 12/28/2023   PLT 760 (H) 12/28/2023

## 2024-01-19 NOTE — Assessment & Plan Note (Addendum)
 He has 30 pounds of intentional weight loss However, his serum protein is low Recommend high-protein diet

## 2024-01-26 ENCOUNTER — Inpatient Hospital Stay: Attending: Hematology and Oncology

## 2024-01-26 ENCOUNTER — Inpatient Hospital Stay (HOSPITAL_BASED_OUTPATIENT_CLINIC_OR_DEPARTMENT_OTHER): Admitting: Hematology and Oncology

## 2024-01-26 ENCOUNTER — Encounter: Payer: Self-pay | Admitting: Hematology and Oncology

## 2024-01-26 VITALS — BP 156/59 | HR 77 | Temp 98.6°F | Resp 18 | Ht 72.0 in | Wt 169.8 lb

## 2024-01-26 DIAGNOSIS — D471 Chronic myeloproliferative disease: Secondary | ICD-10-CM

## 2024-01-26 LAB — CBC WITH DIFFERENTIAL/PLATELET
Abs Immature Granulocytes: 0.03 10*3/uL (ref 0.00–0.07)
Basophils Absolute: 0.2 10*3/uL — ABNORMAL HIGH (ref 0.0–0.1)
Basophils Relative: 1 %
Eosinophils Absolute: 0.4 10*3/uL (ref 0.0–0.5)
Eosinophils Relative: 4 %
HCT: 39.6 % (ref 39.0–52.0)
Hemoglobin: 13.5 g/dL (ref 13.0–17.0)
Immature Granulocytes: 0 %
Lymphocytes Relative: 21 %
Lymphs Abs: 2.2 10*3/uL (ref 0.7–4.0)
MCH: 32.1 pg (ref 26.0–34.0)
MCHC: 34.1 g/dL (ref 30.0–36.0)
MCV: 94.1 fL (ref 80.0–100.0)
Monocytes Absolute: 0.6 10*3/uL (ref 0.1–1.0)
Monocytes Relative: 6 %
Neutro Abs: 6.9 10*3/uL (ref 1.7–7.7)
Neutrophils Relative %: 68 %
Platelets: 807 10*3/uL — ABNORMAL HIGH (ref 150–400)
RBC: 4.21 MIL/uL — ABNORMAL LOW (ref 4.22–5.81)
RDW: 13.9 % (ref 11.5–15.5)
WBC: 10.4 10*3/uL (ref 4.0–10.5)
nRBC: 0 % (ref 0.0–0.2)

## 2024-01-26 MED ORDER — HYDROXYUREA 500 MG PO CAPS: 1000.0000 mg | ORAL_CAPSULE | Freq: Every day | ORAL | Status: DC

## 2024-01-26 NOTE — Assessment & Plan Note (Addendum)
 The patient was noted to have progressive elevated platelet count since 2023 In April 2025, peripheral blood for JAK2 mutation was positive.  MPL, CAL are and test for CML was negative Overall, his diagnosis is most consistent with essential thrombocytosis He started taking hydroxyurea  500 mg daily on 01/19/2024 He has no side effects CBC today show persistent elevated platelet count over 800 I recommend changing hydroxyurea  to 1000 mg daily and return in 2 weeks for further assessment He will continue aspirin therapy The patient has a cancer policy.  He should be eligible to claim for benefits.  I recommend the patient to reach out to his insurance company for further details

## 2024-01-26 NOTE — Progress Notes (Signed)
 [facility] OFFICE PROGRESS NOTE  Patient Care Team: Laneta Pintos, MD as PCP - General (Family Medicine) Sheryle Donning, MD as PCP - Cardiology (Cardiology)  Assessment & Plan Myeloproliferative neoplasm [facility]) The patient was noted to have progressive elevated platelet count since 2023 In April 2025, peripheral blood for JAK2 mutation was positive.  MPL, CAL are and test for CML was negative Overall, his diagnosis is most consistent with essential thrombocytosis He started taking hydroxyurea  500 mg daily on 01/19/2024 He has no side effects CBC today show persistent elevated platelet count over 800 I recommend changing hydroxyurea  to 1000 mg daily and return in 2 weeks for further assessment He will continue aspirin therapy The patient has a cancer policy.  He should be eligible to claim for benefits.  I recommend the patient to reach out to his insurance company for further details  No orders of the defined types were placed in this encounter.    Almeda Jacobs, MD  INTERVAL HISTORY: he returns for surveillance follow-up for myeloproliferative disorder/neoplasm Patient denies recent bleeding such as epistaxis, hematuria or hematochezia No recent infection We reviewed medication list and discussed medication changes We discussed test results and future plan of care as outlined above  PHYSICAL EXAMINATION: ECOG PERFORMANCE STATUS: 0 - Asymptomatic  Vitals:   01/26/24 1317  BP: (!) 156/59  Pulse: 77  Resp: 18  Temp: 98.6 F (37 C)  SpO2: 99%   Lab Results  Component Value Date   WBC 10.4 01/26/2024   HGB 13.5 01/26/2024   HCT 39.6 01/26/2024   MCV 94.1 01/26/2024   PLT 807 (H) 01/26/2024

## 2024-02-02 ENCOUNTER — Other Ambulatory Visit: Payer: Self-pay | Admitting: Nurse Practitioner

## 2024-02-02 DIAGNOSIS — N400 Enlarged prostate without lower urinary tract symptoms: Secondary | ICD-10-CM

## 2024-02-02 DIAGNOSIS — I10 Essential (primary) hypertension: Secondary | ICD-10-CM

## 2024-02-07 ENCOUNTER — Other Ambulatory Visit: Payer: Self-pay | Admitting: Nurse Practitioner

## 2024-02-07 DIAGNOSIS — N400 Enlarged prostate without lower urinary tract symptoms: Secondary | ICD-10-CM

## 2024-02-07 DIAGNOSIS — I10 Essential (primary) hypertension: Secondary | ICD-10-CM

## 2024-02-09 ENCOUNTER — Inpatient Hospital Stay: Admitting: Hematology and Oncology

## 2024-02-09 ENCOUNTER — Encounter: Payer: Self-pay | Admitting: Hematology and Oncology

## 2024-02-09 ENCOUNTER — Inpatient Hospital Stay

## 2024-02-09 VITALS — BP 131/73 | HR 72 | Temp 99.0°F | Resp 17 | Wt 164.0 lb

## 2024-02-09 DIAGNOSIS — D471 Chronic myeloproliferative disease: Secondary | ICD-10-CM

## 2024-02-09 LAB — CBC WITH DIFFERENTIAL/PLATELET
Abs Immature Granulocytes: 0.02 10*3/uL (ref 0.00–0.07)
Basophils Absolute: 0.1 10*3/uL (ref 0.0–0.1)
Basophils Relative: 2 %
Eosinophils Absolute: 0.2 10*3/uL (ref 0.0–0.5)
Eosinophils Relative: 3 %
HCT: 40.4 % (ref 39.0–52.0)
Hemoglobin: 13.8 g/dL (ref 13.0–17.0)
Immature Granulocytes: 0 %
Lymphocytes Relative: 19 %
Lymphs Abs: 1.3 10*3/uL (ref 0.7–4.0)
MCH: 32.5 pg (ref 26.0–34.0)
MCHC: 34.2 g/dL (ref 30.0–36.0)
MCV: 95.3 fL (ref 80.0–100.0)
Monocytes Absolute: 0.6 10*3/uL (ref 0.1–1.0)
Monocytes Relative: 9 %
Neutro Abs: 4.6 10*3/uL (ref 1.7–7.7)
Neutrophils Relative %: 67 %
Platelets: 378 10*3/uL (ref 150–400)
RBC: 4.24 MIL/uL (ref 4.22–5.81)
RDW: 15.4 % (ref 11.5–15.5)
WBC: 6.8 10*3/uL (ref 4.0–10.5)
nRBC: 0 % (ref 0.0–0.2)

## 2024-02-09 MED ORDER — HYDROXYUREA 500 MG PO CAPS: 1000.0000 mg | ORAL_CAPSULE | Freq: Every day | ORAL | 1 refills | Status: DC

## 2024-02-09 NOTE — Progress Notes (Signed)
 [facility] OFFICE PROGRESS NOTE  Patient Care Team: Laneta Pintos, MD as PCP - General (Family Medicine) Sheryle Donning, MD as PCP - Cardiology (Cardiology)  Assessment & Plan Myeloproliferative neoplasm [facility]) The patient was noted to have progressive elevated platelet count since 2023 In April 2025, peripheral blood for JAK2 mutation was positive.  MPL, CAL are and test for CML was negative. His diagnosis is most consistent with essential thrombocytosis He started taking hydroxyurea  500 mg daily on 01/19/2024, dose increased on Jan 26 2024 to 1000 mg daily  His CBC today showed normal platelet count He tolerated hydroxyurea  very well without side effects He will continue the same and I plan to see him back in a month for further follow-up He will continue aspirin therapy to prevent risk of thrombosis  No orders of the defined types were placed in this encounter.    Almeda Jacobs, MD  INTERVAL HISTORY: he returns for surveillance follow-up for myeloproliferative disorder/neoplasm Patient denies recent bleeding such as epistaxis, hematuria or hematochezia No recent infection We reviewed medication list and discussed medication changes We discussed test results and future plan of care as outlined above  PHYSICAL EXAMINATION: ECOG PERFORMANCE STATUS: 0 - Asymptomatic  Vitals:   02/09/24 1327  BP: 131/73  Pulse: 72  Resp: 17  Temp: 99 F (37.2 C)  SpO2: 98%   Lab Results  Component Value Date   WBC 6.8 02/09/2024   HGB 13.8 02/09/2024   HCT 40.4 02/09/2024   MCV 95.3 02/09/2024   PLT 378 02/09/2024

## 2024-02-09 NOTE — Assessment & Plan Note (Addendum)
 The patient was noted to have progressive elevated platelet count since 2023 In April 2025, peripheral blood for JAK2 mutation was positive.  MPL, CAL are and test for CML was negative. His diagnosis is most consistent with essential thrombocytosis He started taking hydroxyurea  500 mg daily on 01/19/2024, dose increased on Jan 26 2024 to 1000 mg daily  His CBC today showed normal platelet count He tolerated hydroxyurea  very well without side effects He will continue the same and I plan to see him back in a month for further follow-up He will continue aspirin therapy to prevent risk of thrombosis

## 2024-02-13 ENCOUNTER — Telehealth: Payer: Self-pay

## 2024-02-13 NOTE — Telephone Encounter (Signed)
 Contacted pt to inquire about what is going on.  He said it started Sunday night and he just feels weak and tired and running a fever.  He took tylenol  yesterday and thought he had beaten it and then fever came back. Stated he woke up and bed was soaked.  Denies diarrhea, cough. This AM his fever was 100.6.  Informed him that we currently did not have any openings and that I would send to provider for recommendations.

## 2024-02-13 NOTE — Telephone Encounter (Signed)
 Ask him if he can come in for a nurse visit and we can check him for covid/flu/rsv and we can get his vitals

## 2024-02-13 NOTE — Telephone Encounter (Signed)
 Appt scheduled

## 2024-02-13 NOTE — Telephone Encounter (Signed)
 Copied from CRM [id number]. Topic: Appointments - Appointment Scheduling >> Feb 13, 2024  9:59 AM Jyl Or S wrote: Patient/patient representative is calling to schedule an appointment. Refer to attachments for appointment information.   Patient is needing appt for acute visit patient has fever and weakness please follow up with patient

## 2024-02-14 ENCOUNTER — Ambulatory Visit (INDEPENDENT_AMBULATORY_CARE_PROVIDER_SITE_OTHER): Admitting: Family Medicine

## 2024-02-14 VITALS — BP 121/69 | HR 77 | Temp 98.0°F | Ht 72.0 in

## 2024-02-14 DIAGNOSIS — R509 Fever, unspecified: Secondary | ICD-10-CM

## 2024-02-14 NOTE — Progress Notes (Signed)
 I spoke with the patient and he has weakness, fatigue accompanying his fever.  No respiratory symptoms.  No gastrointestinal symptoms.  No urinary symptoms.  Only medication change in the past several months has been addition of hydroxyurea .  Recently went to visit family and has been to the emergency department to accompany a family member, so he may have picked up a viral infection from either of these places.  Advised him that if his symptoms are not improving and the testing is negative to let us  know over the next several days.  On exam his lungs were clear, normal heart rate.  Did not look acutely ill.

## 2024-02-14 NOTE — Progress Notes (Signed)
   Acute Office Visit  Subjective:     Patient ID: Larry Harris, male    DOB: Dec 24, 1940, 83 y.o.   MRN: [id number]  Chief Complaint  Patient presents with   Fever    HPI Patient is in today for Fever  ROS      Objective:    BP 121/69   Pulse 77   Temp 98 F (36.7 C) (Oral)   Ht 6' (1.829 m)   SpO2 97%   BMI 22.24 kg/m    Physical Exam  No results found for any visits on 02/14/24.      Assessment & Plan:   Problem List Items Addressed This Visit   None Visit Diagnoses       Fever, unspecified fever cause    -  Primary   Relevant Orders   COVID-19, Flu A+B and RSV       No orders of the defined types were placed in this encounter.   No follow-ups on file.  April Zimmerman Rumple, CMA

## 2024-02-15 ENCOUNTER — Emergency Department (HOSPITAL_BASED_OUTPATIENT_CLINIC_OR_DEPARTMENT_OTHER)
Admission: EM | Admit: 2024-02-15 | Discharge: 2024-02-15 | Disposition: A | Discharge status: Home / Self Care | Attending: Emergency Medicine | Admitting: Emergency Medicine

## 2024-02-15 ENCOUNTER — Ambulatory Visit: Payer: Self-pay

## 2024-02-15 ENCOUNTER — Encounter (HOSPITAL_BASED_OUTPATIENT_CLINIC_OR_DEPARTMENT_OTHER): Payer: Self-pay

## 2024-02-15 ENCOUNTER — Emergency Department (HOSPITAL_BASED_OUTPATIENT_CLINIC_OR_DEPARTMENT_OTHER): Admitting: Radiology

## 2024-02-15 ENCOUNTER — Other Ambulatory Visit: Payer: Self-pay

## 2024-02-15 ENCOUNTER — Ambulatory Visit: Payer: Self-pay | Admitting: Family Medicine

## 2024-02-15 DIAGNOSIS — I2510 Atherosclerotic heart disease of native coronary artery without angina pectoris: Secondary | ICD-10-CM | POA: Insufficient documentation

## 2024-02-15 DIAGNOSIS — D696 Thrombocytopenia, unspecified: Secondary | ICD-10-CM | POA: Diagnosis not present

## 2024-02-15 DIAGNOSIS — R5383 Other fatigue: Secondary | ICD-10-CM | POA: Diagnosis not present

## 2024-02-15 DIAGNOSIS — Z79899 Other long term (current) drug therapy: Secondary | ICD-10-CM | POA: Insufficient documentation

## 2024-02-15 DIAGNOSIS — E860 Dehydration: Secondary | ICD-10-CM | POA: Diagnosis not present

## 2024-02-15 DIAGNOSIS — R509 Fever, unspecified: Secondary | ICD-10-CM | POA: Insufficient documentation

## 2024-02-15 DIAGNOSIS — Z7982 Long term (current) use of aspirin: Secondary | ICD-10-CM | POA: Insufficient documentation

## 2024-02-15 DIAGNOSIS — I10 Essential (primary) hypertension: Secondary | ICD-10-CM | POA: Diagnosis not present

## 2024-02-15 DIAGNOSIS — D649 Anemia, unspecified: Secondary | ICD-10-CM | POA: Diagnosis not present

## 2024-02-15 LAB — CBC WITH DIFFERENTIAL/PLATELET
Abs Immature Granulocytes: 0.03 10*3/uL (ref 0.00–0.07)
Basophils Absolute: 0.1 10*3/uL (ref 0.0–0.1)
Basophils Relative: 1 %
Eosinophils Absolute: 0 10*3/uL (ref 0.0–0.5)
Eosinophils Relative: 1 %
HCT: 37.5 % — ABNORMAL LOW (ref 39.0–52.0)
Hemoglobin: 12.8 g/dL — ABNORMAL LOW (ref 13.0–17.0)
Immature Granulocytes: 1 %
Lymphocytes Relative: 19 %
Lymphs Abs: 1 10*3/uL (ref 0.7–4.0)
MCH: 32.7 pg (ref 26.0–34.0)
MCHC: 34.1 g/dL (ref 30.0–36.0)
MCV: 95.9 fL (ref 80.0–100.0)
Monocytes Absolute: 0.5 10*3/uL (ref 0.1–1.0)
Monocytes Relative: 10 %
Neutro Abs: 3.6 10*3/uL (ref 1.7–7.7)
Neutrophils Relative %: 68 %
Platelets: 250 10*3/uL (ref 150–400)
RBC: 3.91 MIL/uL — ABNORMAL LOW (ref 4.22–5.81)
RDW: 16.8 % — ABNORMAL HIGH (ref 11.5–15.5)
WBC: 5.2 10*3/uL (ref 4.0–10.5)
nRBC: 0 % (ref 0.0–0.2)

## 2024-02-15 LAB — URINALYSIS, ROUTINE W REFLEX MICROSCOPIC
Bacteria, UA: NONE SEEN
Bilirubin Urine: NEGATIVE
Glucose, UA: NEGATIVE mg/dL
Nitrite: NEGATIVE
Specific Gravity, Urine: 1.022 (ref 1.005–1.030)
pH: 6 (ref 5.0–8.0)

## 2024-02-15 LAB — BASIC METABOLIC PANEL WITH GFR
Anion gap: 10 (ref 5–15)
BUN: 26 mg/dL — ABNORMAL HIGH (ref 8–23)
CO2: 26 mmol/L (ref 22–32)
Calcium: 9.2 mg/dL (ref 8.9–10.3)
Chloride: 102 mmol/L (ref 98–111)
Creatinine, Ser: 1.06 mg/dL (ref 0.61–1.24)
GFR, Estimated: >60 mL/min (ref >60)
Glucose, Bld: 101 mg/dL — ABNORMAL HIGH (ref 70–99)
Potassium: 3.9 mmol/L (ref 3.5–5.1)
Sodium: 139 mmol/L (ref 135–145)

## 2024-02-15 LAB — RESP PANEL BY RT-PCR (RSV, FLU A&B, COVID)  RVPGX2
Influenza A by PCR: NEGATIVE
Influenza B by PCR: NEGATIVE
Resp Syncytial Virus by PCR: NEGATIVE
SARS Coronavirus 2 by RT PCR: NEGATIVE

## 2024-02-15 NOTE — ED Provider Notes (Signed)
 [place] EMERGENCY DEPARTMENT AT [facility] Provider Note   CSN: [id number] Arrival date & time: 02/15/24  1219     History  Chief Complaint  Patient presents with   Fatigue   Fever    Larry Harris is a 83 y.o. male.   Fever  Patient is an 83 year old male who presents the ED complaining of a 4-day history of fever, fatigue.  Noted to have a Tmax of 101.69F 2 days ago.  Notes that he previously seen his PCP and had a viral panel done then which was negative.  Noted that he has not had a fever today.  Not taking medication today.  Feeling overall better.  But lives alone and wishing to be evaluated as he had a fever last night.  Denies headache, vision changes, chest pain, shortness of breath, abdominal pain, flank pain, nausea, vomiting, diarrhea, dysuria, lower leg swelling.    Home Medications Prior to Admission medications   Medication Sig Start Date End Date Taking? Authorizing Provider  doxazosin  (CARDURA ) 2 MG tablet TAKE 1 TABLET BY MOUTH EVERYDAY AT BEDTIME 02/07/24  Yes Laneta Pintos, MD  gabapentin  (NEURONTIN ) 300 MG capsule TAKE 1 CAPSULE BY MOUTH THREE TIMES A DAY 12/11/23  Yes Laneta Pintos, MD  hydrochlorothiazide  (HYDRODIURIL ) 12.5 MG tablet Take 1 tablet (12.5 mg total) by mouth daily. 12/11/23  Yes Laneta Pintos, MD  hydroxyurea  (HYDREA ) 500 MG capsule Take 2 capsules (1,000 mg total) by mouth daily. May take with food to minimize GI side effects. 02/09/24  Yes Almeda Jacobs, MD  aspirin 325 MG tablet Take 325 mg by mouth daily.    [provider]  b complex vitamins tablet Take 1 tablet by mouth daily.    [provider]  calcium  carbonate (OS-CAL) 600 MG TABS Take 600 mg by mouth daily.    [provider]  diphenhydrAMINE (BENADRYL) 25 mg capsule Take 25 mg by mouth as needed (insomnia).    [provider]  fluticasone  (FLONASE ) 50 MCG/ACT nasal spray SPRAY 2 SPRAYS INTO EACH NOSTRIL EVERY DAY 11/27/23   Laneta Pintos, MD  loratadine  (CLARITIN  REDITABS) 10 MG dissolvable tablet Take 1 tablet (10 mg total) by mouth daily. As needed for allergy symptoms 11/30/23   Laneta Pintos, MD  Multiple Vitamin (MULTIVITAMIN) capsule Take 1 capsule by mouth daily.    [provider]  vitamin B-12 (CYANOCOBALAMIN) 1000 MCG tablet Take 1,000 mcg by mouth daily.    [provider]  VITAMIN D , CHOLECALCIFEROL, PO Take 1,000 Units by mouth daily.    [provider]      Allergies    Patient has no known allergies.    Review of Systems   Review of Systems  Constitutional:  Positive for fever.  All other systems reviewed and are negative.   Physical Exam Updated Vital Signs BP (!) 105/91   Pulse 70   Temp 98.2 F (36.8 C) (Oral)   Resp 16   Ht 6' (1.829 m)   Wt 74.4 kg   SpO2 97%   BMI 22.24 kg/m  Physical Exam Vitals and nursing note reviewed.  Constitutional:      General: He is not in acute distress.    Appearance: Normal appearance. He is not ill-appearing or diaphoretic.  HENT:     Head: Normocephalic and atraumatic.     Right Ear: Tympanic membrane, ear canal and external ear normal.     Left Ear: Tympanic membrane, ear  canal and external ear normal.     Nose: Nose normal. No congestion.     Mouth/Throat:     Mouth: Mucous membranes are moist.     Pharynx: Oropharynx is clear. No oropharyngeal exudate or posterior oropharyngeal erythema.  Eyes:     General: No scleral icterus.       Right eye: No discharge.        Left eye: No discharge.     Extraocular Movements: Extraocular movements intact.     Conjunctiva/sclera: Conjunctivae normal.  Cardiovascular:     Rate and Rhythm: Normal rate and regular rhythm.     Pulses: Normal pulses.     Heart sounds: Normal heart sounds. No murmur heard.    No friction rub. No gallop.  Pulmonary:     Effort: Pulmonary effort is normal. No respiratory distress.     Breath sounds: Normal breath sounds. No stridor. No  wheezing, rhonchi or rales.  Abdominal:     General: Abdomen is flat. There is no distension.     Palpations: Abdomen is soft.     Tenderness: There is no abdominal tenderness. There is no right CVA tenderness, left CVA tenderness or guarding.  Musculoskeletal:        General: No tenderness or signs of injury.     Cervical back: Normal range of motion and neck supple. No rigidity or tenderness.     Right lower leg: No edema.     Left lower leg: No edema.  Skin:    General: Skin is warm and dry.     Findings: No bruising, erythema, lesion or rash.  Neurological:     General: No focal deficit present.     Mental Status: He is alert and oriented to person, place, and time. Mental status is at baseline.     Sensory: No sensory deficit.     Motor: No weakness.     Coordination: Coordination normal.     Gait: Gait normal.  Psychiatric:        Mood and Affect: Mood normal.     ED Results / Procedures / Treatments   Labs (all labs ordered are listed, but only abnormal results are displayed) Labs Reviewed  CBC WITH DIFFERENTIAL/PLATELET - Abnormal; Notable for the following components:      Result Value   RBC 3.91 (*)    Hemoglobin 12.8 (*)    HCT 37.5 (*)    RDW 16.8 (*)    All other components within normal limits  BASIC METABOLIC PANEL WITH GFR - Abnormal; Notable for the following components:   Glucose, Bld 101 (*)    BUN 26 (*)    All other components within normal limits  URINALYSIS, ROUTINE W REFLEX MICROSCOPIC - Abnormal; Notable for the following components:   Hgb urine dipstick SMALL (*)    Ketones, ur TRACE (*)    Protein, ur TRACE (*)    Leukocytes,Ua TRACE (*)    All other components within normal limits  RESP PANEL BY RT-PCR (RSV, FLU A&B, COVID)  [id number]    EKG None  Radiology DG Chest 2 View Result Date: 02/15/2024 CLINICAL DATA:  Fever.  Fatigue. EXAM: CHEST - 2 VIEW COMPARISON:  09/07/2022 FINDINGS: The cardiomediastinal contours are normal. The lungs are  clear. Pulmonary vasculature is normal. No consolidation, pleural effusion, or pneumothorax. Bone island versus granuloma projecting over the right lung, both of which are benign entities. No acute osseous abnormalities are seen. IMPRESSION: No active cardiopulmonary disease. Electronically Signed  By: Chadwick Colonel M.D.   On: 02/15/2024 15:27    Procedures Procedures    Medications Ordered in ED Medications - No data to display  ED Course/ Medical Decision Making/ A&P                                 Medical Decision Making Amount and/or Complexity of Data Reviewed Labs: ordered. Radiology: ordered.   This patient is a 83 year old male who presents to the ED for concern of fever and fatigue x 4 days.  Noted that he has not had a fever today.  On physical exam, patient is in no acute distress, afebrile, alert and orient x 4, speaking in full sentences, nontachypneic, nontachycardic.  LCTAB, no murmur, abdomen is nontender, ambulatory independently without difficulty, resting comfortably.  Unremarkable physical exam.  Patient requesting labs and imaging today.  With patient being febrile and living alone and elderly, we will obtain baseline labs and imaging to evaluate for insidious causes of infection.  Suspect viral as he seems to be improved today.  Labs note mild dehydration and  anemia but otherwise unremarkable.  Notes that he is only ingested a couple coffee today without any other fluids or food.  However has been tolerating food and fluids without difficulty.  Will recommend that he continues to hydrate and follow-up with PCP for persistent symptoms and return to ED for any new or worsening symptoms.  Patient vital signs have remained stable throughout the course of patient's time in the ED. Low suspicion for any other emergent pathology at this time. I believe this patient is safe to be discharged. Provided strict return to ER precautions. Patient expressed agreement and  understanding of plan. All questions were answered.  Differential diagnoses prior to evaluation: The emergent differential diagnosis includes, but is not limited to, URI, pneumonia, bronchitis, UTI, pyelonephritis, gastritis. This is not an exhaustive differential.   Past Medical History / Co-morbidities / Social History: HLD, CAD, thrombocytopenia, myeloproliferative neoplasm, HTN, osteoarthritis  Additional history: Chart reviewed. Pertinent results include: Seen by PCP yesterday noting to have weakness and fatigue with fever.  But no respiratory symptoms, GI, urinary symptoms and only medication change was addition of hydroxychloroquine several months ago.  Last seen by hematology oncology on 02/09/2024 noted to be treated with essential thrombocytosis starting 500 mg of hydroxyurea  daily on 01/19/24.  Continued on ASA.  Lab Tests/Imaging studies: I personally interpreted labs/imaging and the pertinent results include:    CBC notes a mild anemia with hemoglobin 12.8 , decreased from previous 13.8  BMP shows an elevated BUN likely secondary to dehydration  UA shows trace ketones also likely secondary to dehydration Respiratory panel negative Chest  x-ray unremarkable  I agree with the radiologist interpretation.    Medications:  I have reviewed the patients home medicines and have made adjustments as needed.  Social Determinants of Health:  good follow-up with PCP  Disposition: After consideration of the diagnostic results and the patients response to treatment, I feel that the patient would benefit from discharge and treatment as above.   emergency department workup does not suggest an emergent condition requiring admission or immediate intervention beyond what has been performed at this time. The plan is: Follow-up PCP, return for new or worsening symptoms, hydrate. The patient is safe for discharge and has been instructed to return immediately for worsening symptoms, change in  symptoms or any other concerns.   Final  Clinical Impression(s) / ED Diagnoses Final diagnoses:  Fever, unspecified fever cause    Rx / DC Orders ED Discharge Orders     None         Nohelia Valenza S, PA-C 02/15/24 1541    Deatra Face, MD 02/16/24 1456

## 2024-02-15 NOTE — ED Notes (Signed)
 Patient transported to X-ray

## 2024-02-15 NOTE — Telephone Encounter (Signed)
 Patient called in to report he was in the office yesterday feeling very bad, and was swabbed for a covid test but no results yet. Patient states he is considering calling EMS or going to the ED because he is having trouble with increased weakness and caring for himself, stating he has cut his arms all up trying to ambulate and go to the bathroom. Patient denies numbness/weakness on one side of the body. Patient states he will hang up and call his daughter to take him to ED now. Advised to follow up with PCP after.   Copied from CRM [id number]. Topic: Clinical - Red Word Triage >> Feb 15, 2024 11:00 AM Rennis Case wrote: Red Word that prompted transfer to Nurse Triage:  Thinking about calling ems, getting weaker and having fatigue and harder to take care of himself. Reason for Disposition  Caller requesting routine or non-urgent lab result    Patient going to ED  Answer Assessment - Initial Assessment Questions 1. REASON FOR CALL or QUESTION: "What is your reason for calling today?" or "How can I best help you?" or "What question do you have that I can help answer?"     Please see notes 2. CALLER: Document the source of call. (e.g., laboratory, patient).     Patient  Protocols used: PCP Call - No Triage-A-AH

## 2024-02-15 NOTE — ED Triage Notes (Addendum)
 Patient arrives POV with complaints of fever and fatigue x4 days. Patient reports a high fever of 101F and fatigue causing him to fall. Had a Covid/flu test, but never received results.

## 2024-02-15 NOTE — Discharge Instructions (Addendum)
 He was seen today for intermittent fever, with last 1 being last night.  With symptoms today being fatigued but no other symptoms and labs, imaging, physical exam being reassuring I have low suspicion for any emergent cause of your symptom today.  However recommend you keep a close monitor on your symptoms and if worse, return as more in-depth evaluation will need to be done at that time.  If your symptoms persist, recommend you continue to follow-up with your PCP as noted by your PCP.  If you begin to develop fever with vomiting, abdominal pain, chest pain, shortness of breath, painful urination, confusion, significant weakness return to the ED for further evaluation

## 2024-02-15 NOTE — ED Notes (Signed)
Patient returned from XRay

## 2024-02-15 NOTE — Telephone Encounter (Signed)
    Copied from CRM [id number]. Topic: Clinical - Red Word Triage >> Feb 15, 2024 11:00 AM Rennis Case wrote: Red Word that prompted transfer to Nurse Triage:  Thinking about calling ems, getting weaker and having fatigue and harder to take care of himself.

## 2024-02-16 LAB — COVID-19, FLU A+B AND RSV
Influenza A, NAA: NOT DETECTED
Influenza B, NAA: NOT DETECTED
RSV, NAA: NOT DETECTED
SARS-CoV-2, NAA: NOT DETECTED

## 2024-02-17 ENCOUNTER — Encounter: Payer: Self-pay | Admitting: Hematology and Oncology

## 2024-02-20 ENCOUNTER — Telehealth: Payer: Self-pay

## 2024-02-20 NOTE — Telephone Encounter (Signed)
 Returned his call and told him [facility] will refill Hydrea  on 5/30. He verbalized understanding. On 5/22 he went to ER for fever and fatigue. He is asking if Dr. Marton Sleeper can review ER note.  He is still working on getting his strength back and no fever in 1 day. He is feeling somewhat better. He is able to drink and eat with no problems.

## 2024-02-20 NOTE — Telephone Encounter (Signed)
 Based on his latest CBC, I recommend reducing Hydrea  dose 1000 mg daily except Saturday and Sunday 500 mg Please make changes to his medication

## 2024-02-20 NOTE — Telephone Encounter (Signed)
Called and given below message. He verbalized understanding. 

## 2024-03-15 ENCOUNTER — Inpatient Hospital Stay: Attending: Hematology and Oncology

## 2024-03-15 ENCOUNTER — Inpatient Hospital Stay: Admitting: Hematology and Oncology

## 2024-03-15 ENCOUNTER — Encounter: Payer: Self-pay | Admitting: Hematology and Oncology

## 2024-03-15 VITALS — BP 135/56 | HR 63 | Temp 97.4°F | Resp 18 | Ht 72.0 in | Wt 165.8 lb

## 2024-03-15 DIAGNOSIS — D61818 Other pancytopenia: Secondary | ICD-10-CM | POA: Insufficient documentation

## 2024-03-15 DIAGNOSIS — D471 Chronic myeloproliferative disease: Secondary | ICD-10-CM | POA: Insufficient documentation

## 2024-03-15 LAB — CBC WITH DIFFERENTIAL/PLATELET
Abs Immature Granulocytes: 0.01 10*3/uL (ref 0.00–0.07)
Basophils Absolute: 0 10*3/uL (ref 0.0–0.1)
Basophils Relative: 1 %
Eosinophils Absolute: 0.1 10*3/uL (ref 0.0–0.5)
Eosinophils Relative: 3 %
HCT: 37.1 % — ABNORMAL LOW (ref 39.0–52.0)
Hemoglobin: 12.7 g/dL — ABNORMAL LOW (ref 13.0–17.0)
Immature Granulocytes: 0 %
Lymphocytes Relative: 33 %
Lymphs Abs: 1.3 10*3/uL (ref 0.7–4.0)
MCH: 34.6 pg — ABNORMAL HIGH (ref 26.0–34.0)
MCHC: 34.2 g/dL (ref 30.0–36.0)
MCV: 101.1 fL — ABNORMAL HIGH (ref 80.0–100.0)
Monocytes Absolute: 0.4 10*3/uL (ref 0.1–1.0)
Monocytes Relative: 9 %
Neutro Abs: 2.1 10*3/uL (ref 1.7–7.7)
Neutrophils Relative %: 54 %
Platelets: 184 10*3/uL (ref 150–400)
RBC: 3.67 MIL/uL — ABNORMAL LOW (ref 4.22–5.81)
RDW: 19.5 % — ABNORMAL HIGH (ref 11.5–15.5)
WBC: 3.9 10*3/uL — ABNORMAL LOW (ref 4.0–10.5)
nRBC: 0 % (ref 0.0–0.2)

## 2024-03-15 MED ORDER — HYDROXYUREA 500 MG PO CAPS: ORAL_CAPSULE | ORAL | 1 refills | Status: DC

## 2024-03-15 NOTE — Assessment & Plan Note (Addendum)
 Due to recent treatment He is not symptomatic and we will observe only I anticipate improvement with dose reduction of hydroxyurea 

## 2024-03-15 NOTE — Assessment & Plan Note (Addendum)
 The patient was noted to have progressive elevated platelet count since 2023 In April 2025, peripheral blood for JAK2 mutation was positive.  MPL, CAL are and test for CML was negative. His diagnosis is most consistent with essential thrombocytosis He started taking hydroxyurea  500 mg daily on 01/19/2024, dose increased on Jan 26 2024 to 1000 mg daily  End of May 2025, the dose was reduced further to 1000 mg daily except on Saturdays and Sundays, he will only take 500 mg I reviewed his CBC today He is developing slightly more pancytopenia due to treatment I recommend further dose reduction He will take hydroxyurea  at 500 mg daily except on Mondays, Wednesdays and Fridays, he will take 1000 mg New prescription is sent to his pharmacy He will continue aspirin therapy Per patient request, I spent some time completing his insurance form to apply for benefits from his cancer policy

## 2024-03-15 NOTE — Progress Notes (Signed)
 [facility] OFFICE PROGRESS NOTE  Patient Care Team: Laneta Pintos, MD as PCP - General (Family Medicine) Sheryle Donning, MD as PCP - Cardiology (Cardiology)  Assessment & Plan Myeloproliferative neoplasm [facility]) The patient was noted to have progressive elevated platelet count since 2023 In April 2025, peripheral blood for JAK2 mutation was positive.  MPL, CAL are and test for CML was negative. His diagnosis is most consistent with essential thrombocytosis He started taking hydroxyurea  500 mg daily on 01/19/2024, dose increased on Jan 26 2024 to 1000 mg daily  End of May 2025, the dose was reduced further to 1000 mg daily except on Saturdays and Sundays, he will only take 500 mg I reviewed his CBC today He is developing slightly more pancytopenia due to treatment I recommend further dose reduction He will take hydroxyurea  at 500 mg daily except on Mondays, Wednesdays and Fridays, he will take 1000 mg New prescription is sent to his pharmacy He will continue aspirin therapy Per patient request, I spent some time completing his insurance form to apply for benefits from his cancer policy Acquired pancytopenia (HCC) Due to recent treatment He is not symptomatic and we will observe only I anticipate improvement with dose reduction of hydroxyurea   No orders of the defined types were placed in this encounter.    Almeda Jacobs, MD  INTERVAL HISTORY: he returns for surveillance follow-up for myeloproliferative disorder/neoplasm Patient denies recent bleeding such as epistaxis, hematuria or hematochezia No recent infection We reviewed medication list and discussed medication changes We discussed test results and future plan of care as outlined above  PHYSICAL EXAMINATION: ECOG PERFORMANCE STATUS: 0 - Asymptomatic  Vitals:   03/15/24 1342  BP: (!) 135/56  Pulse: 63  Resp: 18  Temp: (!) 97.4 F (36.3 C)  SpO2: 100%   Lab Results  Component Value Date   WBC  3.9 (L) 03/15/2024   HGB 12.7 (L) 03/15/2024   HCT 37.1 (L) 03/15/2024   MCV 101.1 (H) 03/15/2024   PLT 184 03/15/2024   Total time spent today 30 minutes

## 2024-03-31 ENCOUNTER — Other Ambulatory Visit: Payer: Self-pay | Admitting: Nurse Practitioner

## 2024-03-31 DIAGNOSIS — K649 Unspecified hemorrhoids: Secondary | ICD-10-CM

## 2024-04-01 ENCOUNTER — Other Ambulatory Visit: Payer: Self-pay | Admitting: Family Medicine

## 2024-04-01 DIAGNOSIS — K649 Unspecified hemorrhoids: Secondary | ICD-10-CM

## 2024-04-13 ENCOUNTER — Other Ambulatory Visit: Payer: Self-pay | Admitting: Hematology and Oncology

## 2024-04-16 ENCOUNTER — Inpatient Hospital Stay (HOSPITAL_BASED_OUTPATIENT_CLINIC_OR_DEPARTMENT_OTHER): Admitting: Hematology and Oncology

## 2024-04-16 ENCOUNTER — Encounter: Payer: Self-pay | Admitting: Hematology and Oncology

## 2024-04-16 ENCOUNTER — Inpatient Hospital Stay: Attending: Hematology and Oncology

## 2024-04-16 VITALS — BP 137/67 | HR 71 | Temp 97.6°F | Resp 18 | Ht 72.0 in | Wt 168.2 lb

## 2024-04-16 DIAGNOSIS — Z7982 Long term (current) use of aspirin: Secondary | ICD-10-CM | POA: Diagnosis not present

## 2024-04-16 DIAGNOSIS — D471 Chronic myeloproliferative disease: Secondary | ICD-10-CM | POA: Diagnosis not present

## 2024-04-16 DIAGNOSIS — D61818 Other pancytopenia: Secondary | ICD-10-CM

## 2024-04-16 LAB — CBC WITH DIFFERENTIAL/PLATELET
Abs Immature Granulocytes: 0.01 K/uL (ref 0.00–0.07)
Basophils Absolute: 0 K/uL (ref 0.0–0.1)
Basophils Relative: 1 %
Eosinophils Absolute: 0.1 K/uL (ref 0.0–0.5)
Eosinophils Relative: 2 %
HCT: 37.4 % — ABNORMAL LOW (ref 39.0–52.0)
Hemoglobin: 12.8 g/dL — ABNORMAL LOW (ref 13.0–17.0)
Immature Granulocytes: 0 %
Lymphocytes Relative: 37 %
Lymphs Abs: 1.3 K/uL (ref 0.7–4.0)
MCH: 36.7 pg — ABNORMAL HIGH (ref 26.0–34.0)
MCHC: 34.2 g/dL (ref 30.0–36.0)
MCV: 107.2 fL — ABNORMAL HIGH (ref 80.0–100.0)
Monocytes Absolute: 0.3 K/uL (ref 0.1–1.0)
Monocytes Relative: 9 %
Neutro Abs: 1.8 K/uL (ref 1.7–7.7)
Neutrophils Relative %: 51 %
Platelets: 160 K/uL (ref 150–400)
RBC: 3.49 MIL/uL — ABNORMAL LOW (ref 4.22–5.81)
RDW: 18.9 % — ABNORMAL HIGH (ref 11.5–15.5)
WBC: 3.5 K/uL — ABNORMAL LOW (ref 4.0–10.5)
nRBC: 0 % (ref 0.0–0.2)

## 2024-04-16 MED ORDER — HYDROXYUREA 500 MG PO CAPS: ORAL_CAPSULE | ORAL | Status: DC

## 2024-04-16 NOTE — Assessment & Plan Note (Addendum)
 The patient was noted to have progressive elevated platelet count since 2023 In April 2025, peripheral blood for JAK2 mutation was positive.  MPL, CAL are and test for CML was negative. His diagnosis is most consistent with essential thrombocytosis He started taking hydroxyurea  500 mg daily on 01/19/2024, dose increased on Jan 26 2024 to 1000 mg daily  End of May 2025, the dose was reduced further to 1000 mg daily except on Saturdays and Sundays, he will only take 500 mg I reviewed his CBC today He is developing slightly more pancytopenia due to treatment I recommend further dose reduction He will take hydroxyurea  at 500 mg daily except on Saturdays and Sundays, he will take 1000 mg He will continue aspirin therapy

## 2024-04-16 NOTE — Progress Notes (Signed)
 [facility] OFFICE PROGRESS NOTE  Patient Care Team: Chandra Toribio POUR, MD as PCP - General (Family Medicine) Lonni Slain, MD as PCP - Cardiology (Cardiology)  Assessment & Plan Myeloproliferative neoplasm [facility]) The patient was noted to have progressive elevated platelet count since 2023 In April 2025, peripheral blood for JAK2 mutation was positive.  MPL, CAL are and test for CML was negative. His diagnosis is most consistent with essential thrombocytosis He started taking hydroxyurea  500 mg daily on 01/19/2024, dose increased on Jan 26 2024 to 1000 mg daily  End of May 2025, the dose was reduced further to 1000 mg daily except on Saturdays and Sundays, he will only take 500 mg I reviewed his CBC today He is developing slightly more pancytopenia due to treatment I recommend further dose reduction He will take hydroxyurea  at 500 mg daily except on Saturdays and Sundays, he will take 1000 mg He will continue aspirin therapy Acquired pancytopenia (HCC) Due to recent treatment He is not symptomatic and we will observe only I anticipate improvement with dose reduction of hydroxyurea   No orders of the defined types were placed in this encounter.    Almarie Bedford, MD  INTERVAL HISTORY: he returns for surveillance follow-up for myeloproliferative disorder/neoplasm Patient denies recent bleeding such as epistaxis, hematuria or hematochezia No recent infection We reviewed medication list and discussed medication changes We discussed test results and future plan of care as outlined above  PHYSICAL EXAMINATION: ECOG PERFORMANCE STATUS: 0 - Asymptomatic  Vitals:   04/16/24 1114  BP: 137/67  Pulse: 71  Resp: 18  Temp: 97.6 F (36.4 C)  SpO2: 100%   Lab Results  Component Value Date   WBC 3.5 (L) 04/16/2024   HGB 12.8 (L) 04/16/2024   HCT 37.4 (L) 04/16/2024   MCV 107.2 (H) 04/16/2024   PLT 160 04/16/2024

## 2024-04-16 NOTE — Assessment & Plan Note (Addendum)
 Due to recent treatment He is not symptomatic and we will observe only I anticipate improvement with dose reduction of hydroxyurea 

## 2024-05-02 ENCOUNTER — Ambulatory Visit (INDEPENDENT_AMBULATORY_CARE_PROVIDER_SITE_OTHER)

## 2024-05-02 VITALS — BP 128/71 | HR 64 | Temp 97.6°F | Ht 72.0 in | Wt 165.1 lb

## 2024-05-02 DIAGNOSIS — L0591 Pilonidal cyst without abscess: Secondary | ICD-10-CM | POA: Insufficient documentation

## 2024-05-02 DIAGNOSIS — M533 Sacrococcygeal disorders, not elsewhere classified: Secondary | ICD-10-CM | POA: Insufficient documentation

## 2024-05-02 MED ORDER — DOXYCYCLINE HYCLATE 100 MG PO TABS: 100.0000 mg | ORAL_TABLET | Freq: Two times a day (BID) | ORAL | 0 refills | Status: AC

## 2024-05-02 NOTE — Patient Instructions (Addendum)
 It was nice to see you today!  As we discussed in clinic:  -I have sent in Doxycycline  100 mg to be taken twice per day for 7 days for the cyst on your coccyx. This is likely the beginning of a cyst or something called a pilonidal cyst -Be sure to take this antibiotic with food to avoid upset stomach -For pain control, I would recommend alternating tylenol  and ibuprofen as needed for pain.  -I have also placed a referral to general surgery to have the cyst evaluated and likely lanced, like the one you had previously.  -They will call you to schedule this. If they have not called to schedule the appointment by Wednesday, you can call them and ask for an appointment. Their number is [phone].  If you have any problems before your next visit feel free to message me via MyChart (minor issues or questions) or call the office, otherwise you may reach out to schedule an office visit.  Thank you! Saddie Sacks, PA-C

## 2024-05-02 NOTE — Progress Notes (Signed)
 Acute Office Visit  Subjective:     Patient ID: Larry Harris, male    DOB: 16-May-1941, 83 y.o.   MRN: [id number]  Chief Complaint  Patient presents with   Tailbone Pain    Onset:07/22 Meds: None    HPI  Larry Harris is a 83 y.o. male who presents with a 3 week history of tailbone pain. Reports that he saw his hematologist on 04/16/24 and at that appointment, patient requested to be evaluated for some minor tailbone pain. He reports that at that time, his hematologist looked at it and said there was a small pimple at the gluteal cleft and advised him to monitor it if it didn't go away. Patient reports that since then, the pain and discomfort have worsened and is only exacerbated by sitting. Pain is localized to gluteal cleft. Denies fevers, body aches, chills, blood, or discharge. Does report that he has had a gluteal cleft cyst (likely pilonidal?) several years ago and he had it lanced in office by general surgery. He reports that this pain feels similar and in the same region where his last one was.    ROS Per HPI     Objective:    BP 128/71   Pulse 64   Temp 97.6 F (36.4 C) (Oral)   Ht 6' (1.829 m)   Wt 165 lb 1.9 oz (74.9 kg)   SpO2 100%   BMI 22.39 kg/m    Physical Exam Constitutional:      General: He is not in acute distress.    Appearance: Normal appearance.  Cardiovascular:     Rate and Rhythm: Normal rate and regular rhythm.     Heart sounds: Normal heart sounds. No murmur heard.    No friction rub. No gallop.  Pulmonary:     Effort: Pulmonary effort is normal. No respiratory distress.     Breath sounds: Normal breath sounds.  Musculoskeletal:        General: No swelling.  Skin:    General: Skin is warm and dry.     Comments: Approx dime-sized, singular lesion that can be palpated under the skin at gluteal cleft. Minimal overlying redness and warmth. Area is without swelling, discharge, blood, or sinus tracts. Tender to palpation.   Neurological:     General: No focal deficit present.     Mental Status: He is alert.  Psychiatric:        Mood and Affect: Mood normal.        Behavior: Behavior normal.        Thought Content: Thought content normal.    No results found for any visits on 05/02/24.      Assessment & Plan:   Cyst near coccyx Assessment & Plan: Suspect new formation of gluteal cyst give his prior history of these. Unclear whether pilonidal in nature due to lack of sinus tracts on exam today. Given warmth and tenderness, will start prophylactic doxycycline  100 mg BID x 7 days and referral to general surgery for further evaluation and decision to lance. Control pain with OTC alternating tylenol  and ibuprofen. Patient advised to follow up sooner if area begins to bleed or produce discharge. Patient verbalized understanding and was in agreement with the plan.  Orders: -     Doxycycline  Hyclate; Take 1 tablet (100 mg total) by mouth 2 (two) times daily for 7 days.  Dispense: 14 tablet; Refill: 0 -     Ambulatory referral to General Surgery     Return  if symptoms worsen or fail to improve.  Saddie JULIANNA Sacks, PA-C

## 2024-05-02 NOTE — Assessment & Plan Note (Signed)
 Suspect new formation of gluteal cyst give his prior history of these. Unclear whether pilonidal in nature due to lack of sinus tracts on exam today. Given warmth and tenderness, will start prophylactic doxycycline  100 mg BID x 7 days and referral to general surgery for further evaluation and decision to lance. Control pain with OTC alternating tylenol  and ibuprofen. Patient advised to follow up sooner if area begins to bleed or produce discharge. Patient verbalized understanding and was in agreement with the plan.

## 2024-05-07 ENCOUNTER — Ambulatory Visit
Admission: RE | Admit: 2024-05-07 | Discharge: 2024-05-07 | Disposition: A | Discharge status: Home / Self Care | Source: Ambulatory Visit | Attending: General Surgery | Admitting: General Surgery

## 2024-05-07 ENCOUNTER — Encounter: Payer: Self-pay | Admitting: General Surgery

## 2024-05-07 ENCOUNTER — Ambulatory Visit: Admitting: General Surgery

## 2024-05-07 VITALS — BP 131/69 | HR 70 | Ht 72.0 in | Wt 162.0 lb

## 2024-05-07 DIAGNOSIS — M533 Sacrococcygeal disorders, not elsewhere classified: Secondary | ICD-10-CM

## 2024-05-07 NOTE — Patient Instructions (Signed)
 We have ordered an x-ray to look at your tailbone. You may have this done anytime at [facility] Just enter in through the [facility] entrance and go to Radiology.   We will call you with your results and any next steps.     Follow-up with our office as needed.  Please call and ask to speak with a nurse if you develop questions or concerns.

## 2024-05-13 ENCOUNTER — Telehealth: Payer: Self-pay | Admitting: General Surgery

## 2024-05-13 NOTE — Telephone Encounter (Signed)
 Pt said that he has a xray last week and is asking if we have the results back yet. Pt # is [phone]

## 2024-05-15 NOTE — Progress Notes (Signed)
 Patient ID: BERN FARE, male   DOB: November 30, 1940, 83 y.o.   MRN: [id number] CC: Coccyx Pain History of Present Illness LEOBARDO GRANLUND is a 83 y.o. male with past medical history significant for myeloproliferative disorder presents in consultation for coccyx pain.  The patient reports that in the middle of July he started to develop tailbone pain.  He reports that at that time he noticed a small pimple at the gluteal cleft and brought it attention to his hematologist.  The recommendation that time was to monitor it and if it progresses to follow-up with PCP.  The patient reports that he has continued to have tailbone pain.  He denies any drainage from the area or bleeding.  He does report that he has a history of a gluteal cleft cyst that was I&D several years ago.  He denies any fevers or chills.  Past Medical History Past Medical History:  Diagnosis Date   Acute rhinitis    Allergic rhinitis, cause unspecified    Allergy    BPH (benign prostatic hyperplasia)    CAD (coronary artery disease)    Cancer (HCC)    scalp basal cell skin ca   Cataract    bilateral cateracts removed   Clotting disorder (HCC)    Hypercholesteremia    Hypertension    Hypotension    Neuralgia, neuritis, and radiculitis, unspecified    Osteoarthritis    Spinal stenosis of lumbar region 06/04/2014   Thrombocytopenia, unspecified (HCC)        Past Surgical History:  Procedure Laterality Date   APPENDECTOMY  1958   bilateral cateracts removed     COLONOSCOPY     EYE SURGERY N/A 2010  & 2014   [employer] 12/13/2020   HERNIA REPAIR  1965   x2    No Known Allergies  Current Outpatient Medications  Medication Sig Dispense Refill   aspirin 325 MG tablet Take 325 mg by mouth daily.     b complex vitamins tablet Take 1 tablet by mouth daily.     calcium  carbonate (OS-CAL) 600 MG TABS Take 600 mg by mouth daily.     diphenhydrAMINE (BENADRYL) 25 mg capsule Take 25 mg by mouth as needed (insomnia).      doxazosin  (CARDURA ) 2 MG tablet TAKE 1 TABLET BY MOUTH EVERYDAY AT BEDTIME 90 tablet 3   fluticasone  (FLONASE ) 50 MCG/ACT nasal spray SPRAY 2 SPRAYS INTO EACH NOSTRIL EVERY DAY 48 mL 1   gabapentin  (NEURONTIN ) 300 MG capsule TAKE 1 CAPSULE BY MOUTH THREE TIMES A DAY 270 capsule 3   hydrochlorothiazide  (HYDRODIURIL ) 12.5 MG tablet Take 1 tablet (12.5 mg total) by mouth daily. 90 tablet 3   hydrocortisone  (ANUSOL -HC) 25 MG suppository PLACE 1 SUPPOSITORY RECTALLY 2 TIMES DAILY.**DRUG NOT COVERED** 12 suppository 5   hydroxyurea  (HYDREA ) 500 MG capsule TAKE 1 CAPSULE (500 MG TOTAL) BY MOUTH DAILY except on Saturdays and Sundays take 2 capsules (1000 mg)     loratadine  (CLARITIN  REDITABS) 10 MG dissolvable tablet Take 1 tablet (10 mg total) by mouth daily. As needed for allergy symptoms 31 tablet 12   Multiple Vitamin (MULTIVITAMIN) capsule Take 1 capsule by mouth daily.     vitamin B-12 (CYANOCOBALAMIN) 1000 MCG tablet Take 1,000 mcg by mouth daily.     VITAMIN D , CHOLECALCIFEROL, PO Take 1,000 Units by mouth daily.     No current facility-administered medications for this visit.    Family History Family History  Problem Relation Age of Onset  Stroke Mother 42   Prostate cancer Father 47   Heart failure Brother 109   Lung cancer Sister    Colon cancer Neg Hx    Esophageal cancer Neg Hx    Rectal cancer Neg Hx    Stomach cancer Neg Hx        Social History Social History   Tobacco Use   Smoking status: Former    Current packs/day: 0.00    Average packs/day: 1 pack/day for 25.0 years (25.0 ttl pk-yrs)    Types: Cigarettes    Start date: 07/27/1969    Quit date: 07/27/1994    Years since quitting: 29.8    Passive exposure: Past   Smokeless tobacco: Never  Vaping Use   Vaping status: Never Used  Substance Use Topics   Alcohol use: Yes    Alcohol/week: 1.0 standard drink of alcohol    Types: 1 Glasses of wine per week    Comment: occasional   Drug use: No        ROS Full  ROS of systems performed and is otherwise negative there than what is stated in the HPI  Physical Exam Blood pressure 131/69, pulse 70, height 6' (1.829 m), weight 162 lb (73.5 kg), SpO2 100%.  Alert and oriented x 3, normal work of breathing on room air, regular rate and rhythm, abdomen is soft, nontender nondistended, gluteal cleft exam performed in the presence of a chaperone.  At the median of the posterior line in the gluteal cleft there is evidence of prior incision and drainage.  I do not see any fluctuance or recurrent cyst.  There is no pitting in the gluteal cleft.  There is a prominence to the coccyx but I do not feel any masses or lesions. Data Reviewed Labs from July reviewed and notable for a leukopenia, anemia but normal platelets.  I have personally reviewed the patient's imaging and medical records.    Assessment    Patient with coccygeal pain but no evidence of gluteal cleft or pilonidal cyst.  He does have a bony prominence although this may just be his coccyx.  He reports that he has lost concern this is the reason he is having little bit of pain in the area.  We will get a x-ray of the coccyx to ensure that there is no bony abnormality. A total of 45 minutes was spent reviewing the patient's chart, performing history and physical and discussing treatment options with the patient.   Jayson MALVA Endow

## 2024-05-17 ENCOUNTER — Inpatient Hospital Stay: Attending: Hematology and Oncology

## 2024-05-17 ENCOUNTER — Ambulatory Visit: Admitting: Hematology and Oncology

## 2024-05-17 ENCOUNTER — Encounter: Payer: Self-pay | Admitting: Hematology and Oncology

## 2024-05-17 VITALS — BP 135/62 | HR 83 | Temp 97.9°F | Resp 18 | Ht 72.0 in | Wt 167.7 lb

## 2024-05-17 DIAGNOSIS — D471 Chronic myeloproliferative disease: Secondary | ICD-10-CM

## 2024-05-17 LAB — CBC WITH DIFFERENTIAL/PLATELET
Abs Immature Granulocytes: 0.01 K/uL (ref 0.00–0.07)
Basophils Absolute: 0.1 K/uL (ref 0.0–0.1)
Basophils Relative: 1 %
Eosinophils Absolute: 0.1 K/uL (ref 0.0–0.5)
Eosinophils Relative: 2 %
HCT: 37.8 % — ABNORMAL LOW (ref 39.0–52.0)
Hemoglobin: 13.1 g/dL (ref 13.0–17.0)
Immature Granulocytes: 0 %
Lymphocytes Relative: 38 %
Lymphs Abs: 1.6 K/uL (ref 0.7–4.0)
MCH: 39.2 pg — ABNORMAL HIGH (ref 26.0–34.0)
MCHC: 34.7 g/dL (ref 30.0–36.0)
MCV: 113.2 fL — ABNORMAL HIGH (ref 80.0–100.0)
Monocytes Absolute: 0.3 K/uL (ref 0.1–1.0)
Monocytes Relative: 8 %
Neutro Abs: 2.2 K/uL (ref 1.7–7.7)
Neutrophils Relative %: 51 %
Platelets: 182 K/uL (ref 150–400)
RBC: 3.34 MIL/uL — ABNORMAL LOW (ref 4.22–5.81)
RDW: 14.8 % (ref 11.5–15.5)
WBC: 4.2 K/uL (ref 4.0–10.5)
nRBC: 0 % (ref 0.0–0.2)

## 2024-05-17 NOTE — Assessment & Plan Note (Addendum)
 The patient was noted to have progressive elevated platelet count since 2023 In April 2025, peripheral blood for JAK2 mutation was positive.  MPL, CAL are and test for CML was negative. His diagnosis is most consistent with essential thrombocytosis He started taking hydroxyurea  500 mg daily on 01/19/2024, dose increased on Jan 26 2024 to 1000 mg daily  End of May 2025, the dose was reduced further to 1000 mg daily except on Saturdays and Sundays, he will only take 500 mg In July 2025, I reduced hydroxyurea  to 500 mg daily except on Saturdays and Sundays, he will take 1000 mg CBC today show resolution of pancytopenia Overall, he tolerated current dose well and he will continue the same He will continue aspirin therapy I will see him in 2 months for further follow-up

## 2024-05-17 NOTE — Progress Notes (Signed)
 [facility] OFFICE PROGRESS NOTE  Patient Care Team: Chandra Toribio POUR, MD as PCP - General (Family Medicine) Lonni Slain, MD as PCP - Cardiology (Cardiology)  Assessment & Plan Myeloproliferative neoplasm [facility]) The patient was noted to have progressive elevated platelet count since 2023 In April 2025, peripheral blood for JAK2 mutation was positive.  MPL, CAL are and test for CML was negative. His diagnosis is most consistent with essential thrombocytosis He started taking hydroxyurea  500 mg daily on 01/19/2024, dose increased on Jan 26 2024 to 1000 mg daily  End of May 2025, the dose was reduced further to 1000 mg daily except on Saturdays and Sundays, he will only take 500 mg In July 2025, I reduced hydroxyurea  to 500 mg daily except on Saturdays and Sundays, he will take 1000 mg CBC today show resolution of pancytopenia Overall, he tolerated current dose well and he will continue the same He will continue aspirin therapy I will see him in 2 months for further follow-up  No orders of the defined types were placed in this encounter.    Almarie Bedford, MD  INTERVAL HISTORY: he returns for surveillance follow-up for myeloproliferative disorder/neoplasm Patient denies recent bleeding such as epistaxis, hematuria or hematochezia No recent infection We reviewed medication list and discussed medication changes We discussed test results and future plan of care as outlined above He is undergoing evaluation for the cyst detected in the sacrum area  PHYSICAL EXAMINATION: ECOG PERFORMANCE STATUS: 0 - Asymptomatic  Vitals:   05/17/24 1106  BP: 135/62  Pulse: 83  Resp: 18  Temp: 97.9 F (36.6 C)  SpO2: 100%   Lab Results  Component Value Date   WBC 4.2 05/17/2024   HGB 13.1 05/17/2024   HCT 37.8 (L) 05/17/2024   MCV 113.2 (H) 05/17/2024   PLT 182 05/17/2024

## 2024-05-30 DIAGNOSIS — Z85828 Personal history of other malignant neoplasm of skin: Secondary | ICD-10-CM | POA: Diagnosis not present

## 2024-05-30 DIAGNOSIS — L570 Actinic keratosis: Secondary | ICD-10-CM | POA: Diagnosis not present

## 2024-05-30 DIAGNOSIS — Z872 Personal history of diseases of the skin and subcutaneous tissue: Secondary | ICD-10-CM | POA: Diagnosis not present

## 2024-05-30 DIAGNOSIS — L814 Other melanin hyperpigmentation: Secondary | ICD-10-CM | POA: Diagnosis not present

## 2024-05-30 DIAGNOSIS — L821 Other seborrheic keratosis: Secondary | ICD-10-CM | POA: Diagnosis not present

## 2024-05-30 DIAGNOSIS — D225 Melanocytic nevi of trunk: Secondary | ICD-10-CM | POA: Diagnosis not present

## 2024-05-30 DIAGNOSIS — Z08 Encounter for follow-up examination after completed treatment for malignant neoplasm: Secondary | ICD-10-CM | POA: Diagnosis not present

## 2024-05-30 DIAGNOSIS — L578 Other skin changes due to chronic exposure to nonionizing radiation: Secondary | ICD-10-CM | POA: Diagnosis not present

## 2024-06-24 ENCOUNTER — Ambulatory Visit: Admitting: Family Medicine

## 2024-06-24 ENCOUNTER — Encounter: Payer: Self-pay | Admitting: Family Medicine

## 2024-06-24 VITALS — BP 100/64 | HR 91 | Ht 72.0 in | Wt 164.4 lb

## 2024-06-24 DIAGNOSIS — D471 Chronic myeloproliferative disease: Secondary | ICD-10-CM

## 2024-06-24 DIAGNOSIS — R770 Abnormality of albumin: Secondary | ICD-10-CM

## 2024-06-24 DIAGNOSIS — M533 Sacrococcygeal disorders, not elsewhere classified: Secondary | ICD-10-CM

## 2024-06-24 DIAGNOSIS — K5903 Drug induced constipation: Secondary | ICD-10-CM | POA: Diagnosis not present

## 2024-06-24 DIAGNOSIS — K5900 Constipation, unspecified: Secondary | ICD-10-CM | POA: Insufficient documentation

## 2024-06-24 DIAGNOSIS — I10 Essential (primary) hypertension: Secondary | ICD-10-CM | POA: Diagnosis not present

## 2024-06-24 DIAGNOSIS — Z23 Encounter for immunization: Secondary | ICD-10-CM | POA: Diagnosis not present

## 2024-06-24 DIAGNOSIS — M48062 Spinal stenosis, lumbar region with neurogenic claudication: Secondary | ICD-10-CM | POA: Diagnosis not present

## 2024-06-24 DIAGNOSIS — E785 Hyperlipidemia, unspecified: Secondary | ICD-10-CM | POA: Diagnosis not present

## 2024-06-24 NOTE — Progress Notes (Unsigned)
 Established Patient Office Visit  Subjective   Patient ID: Larry Harris, male    DOB: 1941-05-27  Age: 83 y.o. MRN: [id number]  Chief Complaint  Patient presents with   Medical Management of Chronic Issues    HPI  Subjective - Coccydynia: Reports ongoing pain in the coccyx. States a cushion helps relieve pain when sitting. Pain is worse with sitting and absent when moving, standing, or walking. No issues with sleep. X-rays from [place] were negative. Not interested in surgery.  - Spinal Stenosis: Reports history of spinal stenosis. Experiences pain radiating down the leg with yard labor, consistent with sciatic nerve pain. Sees a pain specialist, Dr. Darlis, for epidural steroid injections which provide temporary relief. Feels he is due for another injection.  - COVID Vaccine: Inquired about needing a prescription for the COVID vaccine.  - Essential thrombocytosis: Followed by hematology. Platelet count has decreased from 800s to 160s-180s. Feels the treatment is working. Inquired about side effects of hydroxyurea .  - Bleeding/Bruising: Notes increased bruising, which he attributes to aspirin and hydroxyurea .  - Constipation: Reports recent onset of constipation with some straining, leading to blood on the tissue but not in the stool. Denies black stools or difficulty stopping bleeding.  Medications: Hydroxyurea  500mg  capsules, currently taking 9 capsules per week (down from 14 per week). Full-dose aspirin daily.  PMH, PSH, FH, Social Hx: PMHx: Coccydynia, spinal stenosis, essential thrombocythemia. PSH: No surgeries mentioned. FH: Not discussed. Social Hx: Lives in a townhouse.  ROS: Constitutional: Denies fever, chills. GI: Positive for constipation and straining with bowel movements, with some blood on tissue. Denies melena. Heme: Positive for easy bruising. Denies other bleeding issues or nosebleeds. MSK: Positive for coccyx pain with sitting and radiating leg  pain with physical labor.    The ASCVD Risk score (Arnett DK, et al., 2019) failed to calculate for the following reasons:   The 2019 ASCVD risk score is only valid for ages 19 to 58  Health Maintenance Due  Topic Date Due   COVID-19 Vaccine (5 - 2025-26 season) 05/27/2024   Medicare Annual Wellness (AWV)  06/26/2024      Objective:     BP 100/64   Pulse 91   Ht 6' (1.829 m)   Wt 164 lb 6.4 oz (74.6 kg)   SpO2 99%   BMI 22.30 kg/m    Physical Exam Gen: alert, oriented Pulm: no respiratory distress Psych: pleasant affect   No results found for any visits on 06/24/24.      Assessment & Plan:   Coccydynia Assessment & Plan: Pain in the coccyx, consistent with coccydynia. X-rays were unremarkable. Pain is positional, worse with sitting and relieved by a cushion and by standing/walking. Patient is not interested in surgical intervention. Sees Dr. Eichman for pain management for a separate issue. - Will contact Dr. Darlis (pain management) to discuss treatment options, such as local injections or a nerve block. - Will follow up with us  if a referral is needed.   Immunization due -     Flu vaccine HIGH DOSE PF(Fluzone Trivalent)  Spinal stenosis of lumbar region with neurogenic claudication Assessment & Plan:  History of spinal stenosis managed by Dr. Darlis with periodic epidural steroid injections. Currently experiencing increased pain radiating down the leg with activity. - Will schedule an appointment with Dr. Darlis for evaluation and likely repeat epidural steroid injection.   Myeloproliferative neoplasm [facility]) Assessment & Plan: Under care of hematology. Taking hydroxyurea  with a good response, as evidenced  by a reduction in platelet count. - Continue current management with hydroxyurea  as directed by hematology. - Advised that primary side effect is a risk of lowering platelets too much, leading to bleeding. - Continue full-dose aspirin as per hematology  recommendation, given the long-standing use and absence of significant bleeding events beyond bruising.   Drug-induced constipation Assessment & Plan: New onset constipation and straining, with some bright red blood on toilet paper. This is a possible side effect of hydroxyurea . - Recommended starting Miralax once daily. - May increase to twice daily if constipation persists.      Return in about 6 months (around 12/22/2024) for physical.    [name], MD

## 2024-06-24 NOTE — Assessment & Plan Note (Signed)
 Under care of hematology. Taking hydroxyurea  with a good response, as evidenced by a reduction in platelet count. - Continue current management with hydroxyurea  as directed by hematology. - Advised that primary side effect is a risk of lowering platelets too much, leading to bleeding. - Continue full-dose aspirin as per hematology recommendation, given the long-standing use and absence of significant bleeding events beyond bruising.

## 2024-06-24 NOTE — Assessment & Plan Note (Signed)
 Pain in the coccyx, consistent with coccydynia. X-rays were unremarkable. Pain is positional, worse with sitting and relieved by a cushion and by standing/walking. Patient is not interested in surgical intervention. Sees Dr. Eichman for pain management for a separate issue. - Will contact Dr. Darlis (pain management) to discuss treatment options, such as local injections or a nerve block. - Will follow up with us  if a referral is needed.

## 2024-06-24 NOTE — Patient Instructions (Addendum)
 It was nice to see you today,  We addressed the following topics today: -For constipation you can take MiraLAX daily.  If you need to you can advise to twice a day.  It is safe to take. - For your tailbone pain I would recommend talking to your pain management doctor about the possibility of local injection into the tailbone or other treatment options. - Continue using the cushions in the meantime. - you should not need a prescription for your COVID-vaccine but if they tell you need 1, let me know which location and which brand of COVID-vaccine you are requesting.    Have a great day,  Rolan Slain, MD

## 2024-06-24 NOTE — Assessment & Plan Note (Signed)
 New onset constipation and straining, with some bright red blood on toilet paper. This is a possible side effect of hydroxyurea . - Recommended starting Miralax once daily. - May increase to twice daily if constipation persists.

## 2024-06-24 NOTE — Assessment & Plan Note (Signed)
 History of spinal stenosis managed by Dr. Darlis with periodic epidural steroid injections. Currently experiencing increased pain radiating down the leg with activity. - Will schedule an appointment with Dr. Darlis for evaluation and likely repeat epidural steroid injection.

## 2024-06-27 ENCOUNTER — Other Ambulatory Visit: Payer: Self-pay | Admitting: Hematology and Oncology

## 2024-07-09 DIAGNOSIS — M48062 Spinal stenosis, lumbar region with neurogenic claudication: Secondary | ICD-10-CM | POA: Diagnosis not present

## 2024-07-19 ENCOUNTER — Inpatient Hospital Stay (HOSPITAL_BASED_OUTPATIENT_CLINIC_OR_DEPARTMENT_OTHER): Admitting: Hematology and Oncology

## 2024-07-19 ENCOUNTER — Encounter: Payer: Self-pay | Admitting: Hematology and Oncology

## 2024-07-19 ENCOUNTER — Inpatient Hospital Stay: Attending: Hematology and Oncology

## 2024-07-19 VITALS — BP 119/69 | HR 70 | Temp 98.5°F | Resp 18 | Ht 72.0 in | Wt 164.2 lb

## 2024-07-19 DIAGNOSIS — D471 Chronic myeloproliferative disease: Secondary | ICD-10-CM

## 2024-07-19 LAB — CBC WITH DIFFERENTIAL/PLATELET
Abs Immature Granulocytes: 0.01 K/uL (ref 0.00–0.07)
Basophils Absolute: 0 K/uL (ref 0.0–0.1)
Basophils Relative: 1 %
Eosinophils Absolute: 0.1 K/uL (ref 0.0–0.5)
Eosinophils Relative: 1 %
HCT: 39 % (ref 39.0–52.0)
Hemoglobin: 13.6 g/dL (ref 13.0–17.0)
Immature Granulocytes: 0 %
Lymphocytes Relative: 25 %
Lymphs Abs: 1.7 K/uL (ref 0.7–4.0)
MCH: 40.2 pg — ABNORMAL HIGH (ref 26.0–34.0)
MCHC: 34.9 g/dL (ref 30.0–36.0)
MCV: 115.4 fL — ABNORMAL HIGH (ref 80.0–100.0)
Monocytes Absolute: 0.7 K/uL (ref 0.1–1.0)
Monocytes Relative: 10 %
Neutro Abs: 4.1 K/uL (ref 1.7–7.7)
Neutrophils Relative %: 63 %
Platelets: 232 K/uL (ref 150–400)
RBC: 3.38 MIL/uL — ABNORMAL LOW (ref 4.22–5.81)
RDW: 13.1 % (ref 11.5–15.5)
WBC: 6.6 K/uL (ref 4.0–10.5)
nRBC: 0 % (ref 0.0–0.2)

## 2024-07-19 NOTE — Progress Notes (Signed)
 [facility] OFFICE PROGRESS NOTE  Patient Care Team: Chandra Toribio POUR, MD as PCP - General (Family Medicine) Lonni Slain, MD as PCP - Cardiology (Cardiology)  Assessment & Plan Myeloproliferative neoplasm [facility]) The patient was noted to have progressive elevated platelet count since 2023 In April 2025, peripheral blood for JAK2 mutation was positive.  MPL, CAL are and test for CML was negative. His diagnosis is most consistent with essential thrombocytosis He started taking hydroxyurea  500 mg daily on 01/19/2024, dose increased on Jan 26 2024 to 1000 mg daily  End of May 2025, the dose was reduced further to 1000 mg daily except on Saturdays and Sundays, he will only take 500 mg In July 2025, I reduced hydroxyurea  to 500 mg daily except on Saturdays and Sundays, he will take 1000 mg CBC today showed normal CBC  Overall, he tolerated current dose well and he will continue the same He will continue aspirin therapy I will see him in 3 months for further follow-up  No orders of the defined types were placed in this encounter.    Almarie Bedford, MD  INTERVAL HISTORY: he returns for surveillance follow-up for myeloproliferative disorder/neoplasm Patient denies recent bleeding such as epistaxis, hematuria or hematochezia No recent infection We reviewed medication list and discussed medication changes We discussed test results and future plan of care as outlined above  PHYSICAL EXAMINATION: ECOG PERFORMANCE STATUS: 0 - Asymptomatic  Vitals:   07/19/24 1113  BP: 119/69  Pulse: 70  Resp: 18  Temp: 98.5 F (36.9 C)  SpO2: 100%   Lab Results  Component Value Date   WBC 6.6 07/19/2024   HGB 13.6 07/19/2024   HCT 39.0 07/19/2024   MCV 115.4 (H) 07/19/2024   PLT 232 07/19/2024

## 2024-07-19 NOTE — Assessment & Plan Note (Addendum)
 The patient was noted to have progressive elevated platelet count since 2023 In April 2025, peripheral blood for JAK2 mutation was positive.  MPL, CAL are and test for CML was negative. His diagnosis is most consistent with essential thrombocytosis He started taking hydroxyurea  500 mg daily on 01/19/2024, dose increased on Jan 26 2024 to 1000 mg daily  End of May 2025, the dose was reduced further to 1000 mg daily except on Saturdays and Sundays, he will only take 500 mg In July 2025, I reduced hydroxyurea  to 500 mg daily except on Saturdays and Sundays, he will take 1000 mg CBC today showed normal CBC  Overall, he tolerated current dose well and he will continue the same He will continue aspirin therapy I will see him in 3 months for further follow-up

## 2024-08-13 ENCOUNTER — Encounter: Payer: Self-pay | Admitting: Family Medicine

## 2024-08-13 ENCOUNTER — Ambulatory Visit (INDEPENDENT_AMBULATORY_CARE_PROVIDER_SITE_OTHER): Admitting: Family Medicine

## 2024-08-13 VITALS — BP 117/67 | HR 75 | Ht 72.0 in | Wt 169.4 lb

## 2024-08-13 DIAGNOSIS — W57XXXA Bitten or stung by nonvenomous insect and other nonvenomous arthropods, initial encounter: Secondary | ICD-10-CM | POA: Insufficient documentation

## 2024-08-13 DIAGNOSIS — S60562A Insect bite (nonvenomous) of left hand, initial encounter: Secondary | ICD-10-CM

## 2024-08-13 DIAGNOSIS — S0001XA Abrasion of scalp, initial encounter: Secondary | ICD-10-CM | POA: Insufficient documentation

## 2024-08-13 MED ORDER — PREDNISONE 20 MG PO TABS: 40.0000 mg | ORAL_TABLET | Freq: Every day | ORAL | 0 refills | Status: AC

## 2024-08-13 NOTE — Progress Notes (Signed)
   Acute Office Visit  Subjective:     Patient ID: Larry Harris, male    DOB: 26-Apr-1941, 83 y.o.   MRN: [id number]  Chief Complaint  Patient presents with   Insect Bite    HPI Patient is in today for   Subjective - Reports a sudden, sharp stinging sensation in the palm of the right hand yesterday while in the kitchen. Unsure of the cause, but suspects an insect or spider bite. Did not see what caused the sensation. After the event, wiped the area but found nothing. Initially, there was a small red mark. Today, the palm is swollen. The swelling is significant enough to obscure the knuckles when making a fist. Reports mild pain on movement, which is less than yesterday. Denies itching or burning. No fevers, chills, or other systemic symptoms. - Reports falling last night after stepping off a curb. Struck the head. Reports an abrasion but denies headache, confusion, vision changes, slurred speech, or unilateral weakness.  Medications Takes Benadryl at night for sleep and took an extra dose yesterday afternoon. Has some Claritin  at home from a prior viral infection. Takes aspirin.  PMH, PSH, FH, Social Hx PMHx: Thrombosis, treated at the cancer center. No history of allergies to insect bites. Social Hx: Denies ants or spiders in the kitchen, though has seen small spiders in the bathroom. Attended a basketball game last night.  ROS Integumentary: Reports swelling and mild tenderness in the right palm. Reports an abrasion on the head. Denies itching or burning at the site of the bite. Neurologic: Denies headache, confusion, double vision, vision changes, slurred speech, or focal weakness after a fall.   ROS      Objective:    BP 117/67   Pulse 75   Ht 6' (1.829 m)   Wt 169 lb 6.4 oz (76.8 kg)   SpO2 99%   BMI 22.97 kg/m    Physical Exam General: No acute distress. HEENT: Abrasion noted on the head, no significant swelling observed. Extremities: Right hand palm  appears swollen, hiding the knuckles on flexion. Not tender to palpation. Full range of motion of fingers. No swelling of fingers. No redness, warmth, or drainage observed. Superficial bruising on left frontal scalp without significant swelling.  No results found for any visits on 08/13/24.      Assessment & Plan:   Insect bite of left hand, initial encounter Assessment & Plan: Suspected insect bite/allergic reaction, right hand: Presents with a sudden stinging sensation in the right hand yesterday, now with significant localized swelling but minimal pain and no signs of infection like redness, warmth, or purulence. - Prednisone  prescription sent to [facility] on [address] for a 5-day course. - Continue Benadryl as needed for swelling or itching, especially at night. - May use over-the-counter Claritin  (loratadine ), one tablet once or twice daily, as an alternative to Benadryl to reduce drowsiness.   Abrasion of scalp, initial encounter Assessment & Plan: Minor head trauma: [place] last night and hit head, resulting in a superficial abrasion. No concerning symptoms such as severe headache, confusion, vision changes, or focal neurologic deficits. Not on anticoagulants. - Continue to monitor for symptoms. - No imaging necessary at this time.   Other orders -     predniSONE ; Take 2 tablets (40 mg total) by mouth daily with breakfast for 5 days.  Dispense: 10 tablet; Refill: 0     Return if symptoms worsen or fail to improve.  [name], MD

## 2024-08-13 NOTE — Assessment & Plan Note (Signed)
 Minor head trauma: [place] last night and hit head, resulting in a superficial abrasion. No concerning symptoms such as severe headache, confusion, vision changes, or focal neurologic deficits. Not on anticoagulants. - Continue to monitor for symptoms. - No imaging necessary at this time.

## 2024-08-13 NOTE — Assessment & Plan Note (Addendum)
 Suspected insect bite/allergic reaction, right hand: Presents with a sudden stinging sensation in the right hand yesterday, now with significant localized swelling but minimal pain and no signs of infection like redness, warmth, or purulence. - Prednisone  prescription sent to [facility] on [address] for a 5-day course. - Continue Benadryl as needed for swelling or itching, especially at night. - May use over-the-counter Claritin  (loratadine ), one tablet once or twice daily, as an alternative to Benadryl to reduce drowsiness.

## 2024-08-13 NOTE — Patient Instructions (Signed)
 It was nice to see you today,  We addressed the following topics today: - I have prescribed a 5-day course of prednisone  to help with the swelling. - You can take over-the-counter Claritin  once or twice a day for the swelling. This may cause less drowsiness than Benadryl. If you would like, you can take benadryl at night instead of the second dose of claritin  - If the hand becomes more red, swollen, painful, or starts to ooze fluid, please let us  know. - Regarding the fall, you should watch for any new symptoms like severe headaches, confusion, vision changes, difficulty with speech, or weakness on one side of your body. If any of these develop, seek medical attention.  Have a great day,  Rolan Slain, MD

## 2024-10-08 ENCOUNTER — Other Ambulatory Visit: Payer: Self-pay

## 2024-10-08 ENCOUNTER — Emergency Department (HOSPITAL_BASED_OUTPATIENT_CLINIC_OR_DEPARTMENT_OTHER)
Admission: EM | Admit: 2024-10-08 | Discharge: 2024-10-08 | Disposition: A | Discharge status: Home / Self Care | Attending: Emergency Medicine | Admitting: Emergency Medicine

## 2024-10-08 ENCOUNTER — Emergency Department (HOSPITAL_BASED_OUTPATIENT_CLINIC_OR_DEPARTMENT_OTHER): Admitting: Radiology

## 2024-10-08 DIAGNOSIS — Z7982 Long term (current) use of aspirin: Secondary | ICD-10-CM | POA: Insufficient documentation

## 2024-10-08 DIAGNOSIS — Z85828 Personal history of other malignant neoplasm of skin: Secondary | ICD-10-CM | POA: Diagnosis not present

## 2024-10-08 DIAGNOSIS — J069 Acute upper respiratory infection, unspecified: Secondary | ICD-10-CM | POA: Insufficient documentation

## 2024-10-08 DIAGNOSIS — I2510 Atherosclerotic heart disease of native coronary artery without angina pectoris: Secondary | ICD-10-CM | POA: Insufficient documentation

## 2024-10-08 DIAGNOSIS — I10 Essential (primary) hypertension: Secondary | ICD-10-CM | POA: Insufficient documentation

## 2024-10-08 DIAGNOSIS — R059 Cough, unspecified: Secondary | ICD-10-CM | POA: Diagnosis present

## 2024-10-08 DIAGNOSIS — Z79899 Other long term (current) drug therapy: Secondary | ICD-10-CM | POA: Diagnosis not present

## 2024-10-08 DIAGNOSIS — R051 Acute cough: Secondary | ICD-10-CM

## 2024-10-08 LAB — RESP PANEL BY RT-PCR (RSV, FLU A&B, COVID)  RVPGX2
Influenza A by PCR: NEGATIVE
Influenza B by PCR: NEGATIVE
Resp Syncytial Virus by PCR: NEGATIVE
SARS Coronavirus 2 by RT PCR: NEGATIVE

## 2024-10-08 LAB — CBC WITH DIFFERENTIAL/PLATELET
Abs Immature Granulocytes: 0.02 K/uL (ref 0.00–0.07)
Basophils Absolute: 0 K/uL (ref 0.0–0.1)
Basophils Relative: 0 %
Eosinophils Absolute: 0 K/uL (ref 0.0–0.5)
Eosinophils Relative: 0 %
HCT: 37.5 % — ABNORMAL LOW (ref 39.0–52.0)
Hemoglobin: 12.9 g/dL — ABNORMAL LOW (ref 13.0–17.0)
Immature Granulocytes: 0 %
Lymphocytes Relative: 9 %
Lymphs Abs: 0.6 K/uL — ABNORMAL LOW (ref 0.7–4.0)
MCH: 39.9 pg — ABNORMAL HIGH (ref 26.0–34.0)
MCHC: 34.4 g/dL (ref 30.0–36.0)
MCV: 116.1 fL — ABNORMAL HIGH (ref 80.0–100.0)
Monocytes Absolute: 0.5 K/uL (ref 0.1–1.0)
Monocytes Relative: 8 %
Neutro Abs: 5.1 K/uL (ref 1.7–7.7)
Neutrophils Relative %: 83 %
Platelets: 157 K/uL (ref 150–400)
RBC: 3.23 MIL/uL — ABNORMAL LOW (ref 4.22–5.81)
RDW: 12.8 % (ref 11.5–15.5)
WBC: 6.3 K/uL (ref 4.0–10.5)
nRBC: 0 % (ref 0.0–0.2)

## 2024-10-08 LAB — COMPREHENSIVE METABOLIC PANEL WITH GFR
ALT: 13 U/L (ref 0–44)
AST: 16 U/L (ref 15–41)
Albumin: 3.6 g/dL (ref 3.5–5.0)
Alkaline Phosphatase: 63 U/L (ref 38–126)
Anion gap: 8 (ref 5–15)
BUN: 16 mg/dL (ref 8–23)
CO2: 31 mmol/L (ref 22–32)
Calcium: 9.1 mg/dL (ref 8.9–10.3)
Chloride: 100 mmol/L (ref 98–111)
Creatinine, Ser: 0.94 mg/dL (ref 0.61–1.24)
GFR, Estimated: >60 mL/min
Glucose, Bld: 142 mg/dL — ABNORMAL HIGH (ref 70–99)
Potassium: 4.2 mmol/L (ref 3.5–5.1)
Sodium: 139 mmol/L (ref 135–145)
Total Bilirubin: 0.8 mg/dL (ref 0.0–1.2)
Total Protein: 5.4 g/dL — ABNORMAL LOW (ref 6.5–8.1)

## 2024-10-08 LAB — GROUP A STREP BY PCR: Group A Strep by PCR: NOT DETECTED

## 2024-10-08 MED ORDER — BENZONATATE 100 MG PO CAPS: 100.0000 mg | ORAL_CAPSULE | Freq: Three times a day (TID) | ORAL | 0 refills | Status: AC

## 2024-10-08 NOTE — ED Triage Notes (Addendum)
 Reports headache, congestion, and yellow sputum x 2 days, Denies CP or SHOB   Hypotensive of 90/60s over the last two days.

## 2024-10-08 NOTE — ED Provider Notes (Signed)
 " [place] EMERGENCY DEPARTMENT AT [facility] Provider Note   CSN: [id number] Arrival date & time: 10/08/24  1112     Patient presents with: Nasal Congestion   Larry Harris is a 84 y.o. male.   HPI    84yo male with history of hypertension, hypercholesterolemia, CAD, myeloproliferative neoplasm suspected essential thrombocytosis who presents with 3 days of cough, congestion, sore throat.    Saturday night started feeling badly, didn't feel good enough to go to church Sunday Throat feels raw, not a lot of coughing but some of the stuff coughing up is yellow Felt like coming down with something on Saturdya, then Sunday and Monday worse  Coughing, sore throat, coughing up yellow No known fevers No dyspnea or chest pain Energy level has been low  This AM was dizzy, lightheaded Takes bp medication, started on Usually is 120 at home, but the last few days has been 100s at home for last few days, (not 90s as the triage note says) Did have lightheadedness this AM but is improved now, Eating ok No urinary symptoms or abdominal pain Urinarting ok, not more than usual  No hx of lung disease, no copd or asthma  No leg swelling, has foot pain from spinal stenosis   Past Medical History:  Diagnosis Date   Acute rhinitis    Allergic rhinitis, cause unspecified    Allergy    BPH (benign prostatic hyperplasia)    CAD (coronary artery disease)    Cancer (HCC)    scalp basal cell skin ca   Cataract    bilateral cateracts removed   Clotting disorder    Hypercholesteremia    Hypertension    Hypotension    Neuralgia, neuritis, and radiculitis, unspecified    Osteoarthritis    Spinal stenosis of lumbar region 06/04/2014   Thrombocytopenia, unspecified      Prior to Admission medications  Medication Sig Start Date End Date Taking? Authorizing Provider  benzonatate  (TESSALON ) 100 MG capsule Take 1 capsule (100 mg total) by mouth every 8 (eight) hours. 10/08/24  Yes  Dreama Longs, MD  aspirin 325 MG tablet Take 325 mg by mouth daily.    [provider]  b complex vitamins tablet Take 1 tablet by mouth daily.    [provider]  calcium  carbonate (OS-CAL) 600 MG TABS Take 600 mg by mouth daily.    [provider]  diphenhydrAMINE (BENADRYL) 25 mg capsule Take 25 mg by mouth as needed (insomnia).    [provider]  doxazosin  (CARDURA ) 2 MG tablet TAKE 1 TABLET BY MOUTH EVERYDAY AT BEDTIME 02/07/24   Chandra Toribio POUR, MD  fluticasone  (FLONASE ) 50 MCG/ACT nasal spray SPRAY 2 SPRAYS INTO EACH NOSTRIL EVERY DAY 11/27/23   Chandra Toribio POUR, MD  gabapentin  (NEURONTIN ) 300 MG capsule TAKE 1 CAPSULE BY MOUTH THREE TIMES A DAY 12/11/23   Chandra Toribio POUR, MD  hydrochlorothiazide  (HYDRODIURIL ) 12.5 MG tablet Take 1 tablet (12.5 mg total) by mouth daily. 12/11/23   Chandra Toribio POUR, MD  hydrocortisone  (ANUSOL -HC) 25 MG suppository PLACE 1 SUPPOSITORY RECTALLY 2 TIMES DAILY.**DRUG NOT COVERED** 04/01/24   Chandra Toribio POUR, MD  hydroxyurea  (HYDREA ) 500 MG capsule TAKE 2 CAPSULES (1,000 MG TOTAL) BY MOUTH DAILY. MAY TAKE WITH FOOD TO MINIMIZE GI SIDE EFFECTS. 06/27/24   Lonn Hicks, MD  loratadine  (CLARITIN  REDITABS) 10 MG dissolvable tablet Take 1 tablet (10 mg total) by mouth daily. As needed for allergy symptoms 11/30/23   Chandra Toribio POUR,  MD  Multiple Vitamin (MULTIVITAMIN) capsule Take 1 capsule by mouth daily.    [provider]  vitamin B-12 (CYANOCOBALAMIN) 1000 MCG tablet Take 1,000 mcg by mouth daily.    [provider]  VITAMIN D , CHOLECALCIFEROL, PO Take 1,000 Units by mouth daily.    [provider]    Allergies: Patient has no known allergies.    Review of Systems  Updated Vital Signs BP (!) 135/56   Pulse 86   Temp 98.4 F (36.9 C) (Oral)   Resp 19   SpO2 97%   Physical Exam Vitals and nursing note reviewed.  Constitutional:      General: He is not in acute distress.    Appearance: He is  well-developed. He is not diaphoretic.  HENT:     Head: Normocephalic and atraumatic.  Eyes:     Conjunctiva/sclera: Conjunctivae normal.  Cardiovascular:     Rate and Rhythm: Normal rate and regular rhythm.     Heart sounds: Normal heart sounds. No murmur heard.    No friction rub. No gallop.  Pulmonary:     Effort: Pulmonary effort is normal. No respiratory distress.     Breath sounds: Normal breath sounds. No wheezing or rales.  Abdominal:     General: There is no distension.     Palpations: Abdomen is soft.     Tenderness: There is no abdominal tenderness. There is no guarding.  Musculoskeletal:     Cervical back: Normal range of motion.  Skin:    General: Skin is warm and dry.  Neurological:     Mental Status: He is alert and oriented to person, place, and time.     (all labs ordered are listed, but only abnormal results are displayed) Labs Reviewed  CBC WITH DIFFERENTIAL/PLATELET - Abnormal; Notable for the following components:      Result Value   RBC 3.23 (*)    Hemoglobin 12.9 (*)    HCT 37.5 (*)    MCV 116.1 (*)    MCH 39.9 (*)    Lymphs Abs 0.6 (*)    All other components within normal limits  COMPREHENSIVE METABOLIC PANEL WITH GFR - Abnormal; Notable for the following components:   Glucose, Bld 142 (*)    Total Protein 5.4 (*)    All other components within normal limits  RESP PANEL BY RT-PCR (RSV, FLU A&B, COVID)  [id number]  GROUP A STREP BY PCR    EKG: EKG Interpretation Date/Time:  Tuesday October 08 2024 14:59:27 EST Ventricular Rate:  85 PR Interval:  154 QRS Duration:  153 QT Interval:  410 QTC Calculation: 488 R Axis:   21  Text Interpretation: Sinus rhythm Consider left atrial enlargement Right bundle branch block No previous ECGs available Confirmed by Dreama Longs ([id number]) on 10/08/2024 3:03:15 PM  Radiology: [facility] Chest 2 View Result Date: 10/08/2024 CLINICAL DATA:  Shortness of breath with headache and congestion. EXAM: CHEST - 2 VIEW  COMPARISON:  02/15/2024 FINDINGS: Lungs are adequately inflated without focal airspace consolidation or effusion. Cardiomediastinal silhouette and remainder of the exam is unchanged. IMPRESSION: No active cardiopulmonary disease. Electronically Signed   By: Toribio Agreste M.D.   On: 10/08/2024 12:08     Procedures   Medications Ordered in the ED - No data to display                                   83yo  male with history of hypertension, hypercholesterolemia, CAD, myeloproliferative neoplasm suspected essential thrombocytosis who presents with 3 days of cough, congestion, sore throat.   EKG completed and evaluated by me shows similar RBBB in comparison to prior.  CXR completed and evaluated by me and radiology shows no sign of pneumonia, no edema or pneumothorax.   Labs completed and evaluated by me show no significant anemia, no leukocytosis, no clinically significant electrolyte abnormalities.  COVID, flu, influenza testing negative.  Initial blood pressure in 80s at triage, unclear if this is true value with improvement to 107 without any intervention.  Consider possible vasovagal episode.  Do not feel history, exam or labs are consistent with sepsis, dehydration, anemia, medication effects.  BP stable on continued evaluation in ED.   History of cough, congestion for last few days consistent with likely viral URI.  Given short duration of illness, no hypoxia, no chest pain or dyspnea, no fever, no leukocytosis, negative XR doubt occult pneumonia, ACS, PE.  Recommend continued supportive care, discussed reasons to return.  Given tessalon  pearls. Patient discharged in stable condition with understanding of reasons to return.          Final diagnoses:  Viral URI  Acute cough    ED Discharge Orders          Ordered    benzonatate  (TESSALON ) 100 MG capsule  Every 8 hours        10/08/24 1503               Dreama Longs, MD 10/08/24 2127  "

## 2024-10-18 ENCOUNTER — Ambulatory Visit: Payer: Self-pay

## 2024-10-18 ENCOUNTER — Encounter: Payer: Self-pay | Admitting: Hematology and Oncology

## 2024-10-18 ENCOUNTER — Telehealth: Payer: Self-pay | Admitting: *Deleted

## 2024-10-18 ENCOUNTER — Inpatient Hospital Stay: Admitting: Hematology and Oncology

## 2024-10-18 ENCOUNTER — Inpatient Hospital Stay: Attending: Hematology and Oncology

## 2024-10-18 VITALS — BP 124/69 | HR 76 | Temp 97.8°F | Resp 18 | Ht 72.0 in | Wt 168.8 lb

## 2024-10-18 DIAGNOSIS — D471 Chronic myeloproliferative disease: Secondary | ICD-10-CM | POA: Diagnosis not present

## 2024-10-18 DIAGNOSIS — D649 Anemia, unspecified: Secondary | ICD-10-CM | POA: Diagnosis not present

## 2024-10-18 LAB — CBC WITH DIFFERENTIAL/PLATELET
Abs Immature Granulocytes: 0.06 K/uL (ref 0.00–0.07)
Basophils Absolute: 0 K/uL (ref 0.0–0.1)
Basophils Relative: 0 %
Eosinophils Absolute: 0.1 K/uL (ref 0.0–0.5)
Eosinophils Relative: 1 %
HCT: 32.7 % — ABNORMAL LOW (ref 39.0–52.0)
Hemoglobin: 11.5 g/dL — ABNORMAL LOW (ref 13.0–17.0)
Immature Granulocytes: 1 %
Lymphocytes Relative: 12 %
Lymphs Abs: 1.1 K/uL (ref 0.7–4.0)
MCH: 40.1 pg — ABNORMAL HIGH (ref 26.0–34.0)
MCHC: 35.2 g/dL (ref 30.0–36.0)
MCV: 113.9 fL — ABNORMAL HIGH (ref 80.0–100.0)
Monocytes Absolute: 0.8 K/uL (ref 0.1–1.0)
Monocytes Relative: 9 %
Neutro Abs: 6.9 K/uL (ref 1.7–7.7)
Neutrophils Relative %: 77 %
Platelets: 258 K/uL (ref 150–400)
RBC: 2.87 MIL/uL — ABNORMAL LOW (ref 4.22–5.81)
RDW: 12.4 % (ref 11.5–15.5)
WBC: 9 K/uL (ref 4.0–10.5)
nRBC: 0 % (ref 0.0–0.2)

## 2024-10-18 MED ORDER — HYDROXYUREA 500 MG PO CAPS: ORAL_CAPSULE | ORAL | Status: AC

## 2024-10-18 NOTE — Telephone Encounter (Signed)
 This nurse was alerted that a patient had just fallen. Upon arrival, pt was sitting in chair A&O. Able to MAE and appeared to not be in any distress or injury. Pt stated that he had a bad cold and had previously fallen last week at home. He stated that there was no dizziness or feelings of feeling faint before fall. He stated that his left knee is weak and gave out on him. Vitals signs were normal and pt son was present. He stated that he was OK to leave and didn't need any medical attention. This nurse recommended that I walk along side patient for support. Pt ambulated to door with son and waited until car arrived and pt was able to get into car with no issues. Advised to call office to f/u with any concerns. Pt and son verbalized understanding.

## 2024-10-18 NOTE — Progress Notes (Signed)
 [facility] OFFICE PROGRESS NOTE  Patient Care Team: Chandra Toribio POUR, MD as PCP - General (Family Medicine) Lonni Slain, MD as PCP - Cardiology (Cardiology)  Assessment & Plan Myeloproliferative neoplasm [facility]) The patient was noted to have progressive elevated platelet count since 2023 In April 2025, peripheral blood for JAK2 mutation was positive.  MPL, CAL are and test for CML was negative. His diagnosis is most consistent with essential thrombocytosis He started taking hydroxyurea  500 mg daily on 01/19/2024, dose increased on Jan 26 2024 to 1000 mg daily  End of May 2025, the dose was reduced further to 1000 mg daily except on Saturdays and Sundays, he will only take 500 mg In July 2025, I reduced hydroxyurea  to 500 mg daily except on Saturdays and Sundays, he will take 1000 mg The patient is recently ill Unfortunately, blood work showed worsening anemia I recommend reducing Hydrea  to 500 mg daily I will see him back in a month for further follow-up  No orders of the defined types were placed in this encounter.    Almarie Bedford, MD  INTERVAL HISTORY: he returns for surveillance follow-up for myeloproliferative disorder/neoplasm Patient denies recent bleeding such as epistaxis, hematuria or hematochezia He complained of recent upper respiratory tract illness We reviewed medication list and discussed medication changes We discussed test results and future plan of care as outlined above  PHYSICAL EXAMINATION: ECOG PERFORMANCE STATUS: 0 - Asymptomatic  Vitals:   10/18/24 1221 10/18/24 1230  BP: (!) 112/54 124/69  Pulse: 75 76  Resp: 18 18  Temp: 97.8 F (36.6 C)   SpO2: 100% 100%   Lab Results  Component Value Date   WBC 9.0 10/18/2024   HGB 11.5 (L) 10/18/2024   HCT 32.7 (L) 10/18/2024   MCV 113.9 (H) 10/18/2024   PLT 258 10/18/2024

## 2024-10-18 NOTE — Assessment & Plan Note (Addendum)
 The patient was noted to have progressive elevated platelet count since 2023 In April 2025, peripheral blood for JAK2 mutation was positive.  MPL, CAL are and test for CML was negative. His diagnosis is most consistent with essential thrombocytosis He started taking hydroxyurea  500 mg daily on 01/19/2024, dose increased on Jan 26 2024 to 1000 mg daily  End of May 2025, the dose was reduced further to 1000 mg daily except on Saturdays and Sundays, he will only take 500 mg In July 2025, I reduced hydroxyurea  to 500 mg daily except on Saturdays and Sundays, he will take 1000 mg The patient is recently ill Unfortunately, blood work showed worsening anemia I recommend reducing Hydrea  to 500 mg daily I will see him back in a month for further follow-up

## 2024-10-18 NOTE — Telephone Encounter (Signed)
 FYI Only or Action Required?: FYI only for provider: ED advised.  Patient was last seen in primary care on 08/13/2024 by Chandra Toribio POUR, MD.  Called Nurse Triage reporting Cough.  Symptoms began several days ago.  Interventions attempted: Nothing.  Symptoms are: unchanged.  Triage Disposition: Go to ED Now (or PCP Triage)  Patient/caregiver understands and will follow disposition?: No, wishes to speak with PCP    Message from [facility] P sent at 10/18/2024  2:25 PM EST  Reason for Triage: knocking off equilibrium  alot mucus brown and yellow, clearish,   Reason for Disposition  [1] Dizziness caused by heat exposure, sudden standing, or poor fluid intake AND [2] no improvement after 2 hours of rest and fluids  SEVERE dizziness (e.g., unable to stand, requires support to walk, feels like passing out now)  Answer Assessment - Initial Assessment Questions Advised ED now. Patient declines and requests appt.  Advised call back or ED/911 if symptoms occur/worsen: severe diff breathing, chest pain > 5 min, faint. Severe HA, changes vision, confusion, diff speech/ walking, numbness/weakness one side of body. Patient verbalized understanding.   1. DESCRIPTION: Describe your dizziness. Reports dizziness, fall,  cough, runny nose, green/brown drainage   Patient reports Think equilibrium is off, knees got weak. Reports fall today at oncology center, reports vitals were fine but labs shown low hemoglobin. Patient reports multiple falls in last few days, denies head/ injuries; only skinned elbows.  2. LIGHTHEADED: Do you feel lightheaded? (e.g., somewhat faint, woozy, weak upon standing)     Comes and goes 3. VERTIGO: Do you feel like either you or the room is spinning or tilting? (i.e., vertigo)     no 4. SEVERITY: How bad is it?  Do you feel like you are going to faint? Can you stand and walk?     Yes, but uses walker 5. ONSET:  When did the dizziness begin?     10 days 6.  AGGRAVATING FACTORS: Does anything make it worse? (e.g., standing, change in head position)    Standing; come and goes 8. CAUSE: What do you think is causing the dizziness? (e.g., decreased fluids or food, diarrhea, emotional distress, heat exposure, new medicine, sudden standing, vomiting; unknown)     Cold; cough runny nose 9. RECURRENT SYMPTOM: Have you had dizziness before? If Yes, ask: When was the last time? What happened that time?     Yes; happened before last year 10. OTHER SYMPTOMS: Do you have any other symptoms? (e.g., fever, chest pain, vomiting, diarrhea, bleeding)      Denies diff breath, chest pain, faint, fever chill n/v/d  Protocols used: Dizziness - Lightheadedness-A-AH

## 2024-10-21 ENCOUNTER — Other Ambulatory Visit: Payer: Self-pay | Admitting: Family Medicine

## 2024-10-21 MED ORDER — HYDROCODONE BIT-HOMATROP MBR 5-1.5 MG/5ML PO SOLN: 5.0000 mL | Freq: Every evening | ORAL | 0 refills | Status: AC | PRN

## 2024-10-21 NOTE — Telephone Encounter (Signed)
 Spoke w/ pt who is feeling slightly better.  Main issues is sinus congestion and nighttime cough.  Prescribed hycodan cough syrup for him to use at night.  Advised him to hold off on the benadryl he takes at night for sleep when he is taking this medication.

## 2024-11-15 ENCOUNTER — Inpatient Hospital Stay

## 2024-11-15 ENCOUNTER — Inpatient Hospital Stay: Admitting: Hematology and Oncology

## 2024-12-16 ENCOUNTER — Other Ambulatory Visit

## 2024-12-23 ENCOUNTER — Encounter: Admitting: Family Medicine

## 2025-02-11 ENCOUNTER — Ambulatory Visit: Admitting: Hematology and Oncology

## 2025-02-11 ENCOUNTER — Other Ambulatory Visit
